# Patient Record
Sex: Male | Born: 1955 | Race: White | Hispanic: No | Marital: Married | Smoking: Former smoker
Health system: Southern US, Community
[De-identification: ages and names within clinical notes are randomized; demographics above are authoritative.]

## PROBLEM LIST (undated history)

## (undated) DIAGNOSIS — K429 Umbilical hernia without obstruction or gangrene: Secondary | ICD-10-CM

## (undated) DIAGNOSIS — G4733 Obstructive sleep apnea (adult) (pediatric): Secondary | ICD-10-CM

## (undated) DIAGNOSIS — E785 Hyperlipidemia, unspecified: Secondary | ICD-10-CM

## (undated) DIAGNOSIS — G4730 Sleep apnea, unspecified: Secondary | ICD-10-CM

## (undated) DIAGNOSIS — M1990 Unspecified osteoarthritis, unspecified site: Secondary | ICD-10-CM

## (undated) DIAGNOSIS — F17200 Nicotine dependence, unspecified, uncomplicated: Secondary | ICD-10-CM

## (undated) DIAGNOSIS — J45909 Unspecified asthma, uncomplicated: Secondary | ICD-10-CM

## (undated) DIAGNOSIS — I5189 Other ill-defined heart diseases: Secondary | ICD-10-CM

## (undated) DIAGNOSIS — I10 Essential (primary) hypertension: Secondary | ICD-10-CM

## (undated) DIAGNOSIS — K219 Gastro-esophageal reflux disease without esophagitis: Secondary | ICD-10-CM

## (undated) DIAGNOSIS — J449 Chronic obstructive pulmonary disease, unspecified: Secondary | ICD-10-CM

## (undated) DIAGNOSIS — Z9989 Dependence on other enabling machines and devices: Secondary | ICD-10-CM

## (undated) DIAGNOSIS — E669 Obesity, unspecified: Secondary | ICD-10-CM

## (undated) DIAGNOSIS — I2510 Atherosclerotic heart disease of native coronary artery without angina pectoris: Secondary | ICD-10-CM

## (undated) HISTORY — DX: Other ill-defined heart diseases: I51.89

## (undated) HISTORY — DX: Gastro-esophageal reflux disease without esophagitis: K21.9

## (undated) HISTORY — DX: Dependence on other enabling machines and devices: Z99.89

## (undated) HISTORY — DX: Obstructive sleep apnea (adult) (pediatric): G47.33

## (undated) HISTORY — DX: Unspecified asthma, uncomplicated: J45.909

## (undated) HISTORY — DX: Sleep apnea, unspecified: G47.30

## (undated) HISTORY — DX: Nicotine dependence, unspecified, uncomplicated: F17.200

## (undated) HISTORY — DX: Essential (primary) hypertension: I10

## (undated) HISTORY — DX: Obesity, unspecified: E66.9

## (undated) HISTORY — DX: Hyperlipidemia, unspecified: E78.5

## (undated) HISTORY — PX: CARDIAC CATHETERIZATION: SHX172

## (undated) HISTORY — DX: Umbilical hernia without obstruction or gangrene: K42.9

## (undated) HISTORY — DX: Chronic obstructive pulmonary disease, unspecified: J44.9

## (undated) HISTORY — DX: Atherosclerotic heart disease of native coronary artery without angina pectoris: I25.10

## (undated) HISTORY — DX: Unspecified osteoarthritis, unspecified site: M19.90

---
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
## 2002-05-24 ENCOUNTER — Ambulatory Visit (HOSPITAL_COMMUNITY): Admission: RE | Admit: 2002-05-24 | Discharge: 2002-05-24 | Payer: Self-pay | Admitting: Specialist

## 2002-05-24 ENCOUNTER — Encounter: Payer: Self-pay | Admitting: Specialist

## 2005-04-17 HISTORY — PX: COLONOSCOPY: SHX174

## 2005-05-31 ENCOUNTER — Ambulatory Visit: Payer: Self-pay | Admitting: Gastroenterology

## 2005-07-28 ENCOUNTER — Ambulatory Visit: Payer: Self-pay | Admitting: Gastroenterology

## 2005-07-28 ENCOUNTER — Encounter: Payer: Self-pay | Admitting: Gastroenterology

## 2005-10-08 ENCOUNTER — Emergency Department (HOSPITAL_COMMUNITY): Admission: EM | Admit: 2005-10-08 | Discharge: 2005-10-08 | Payer: Self-pay | Admitting: Emergency Medicine

## 2010-09-14 ENCOUNTER — Ambulatory Visit (HOSPITAL_BASED_OUTPATIENT_CLINIC_OR_DEPARTMENT_OTHER): Payer: 59 | Attending: Family Medicine

## 2010-09-14 DIAGNOSIS — I491 Atrial premature depolarization: Secondary | ICD-10-CM | POA: Insufficient documentation

## 2010-09-14 DIAGNOSIS — G4733 Obstructive sleep apnea (adult) (pediatric): Secondary | ICD-10-CM | POA: Insufficient documentation

## 2010-09-17 DIAGNOSIS — G4733 Obstructive sleep apnea (adult) (pediatric): Secondary | ICD-10-CM

## 2010-09-17 DIAGNOSIS — R0609 Other forms of dyspnea: Secondary | ICD-10-CM

## 2010-09-17 DIAGNOSIS — R0989 Other specified symptoms and signs involving the circulatory and respiratory systems: Secondary | ICD-10-CM

## 2010-09-17 NOTE — Procedures (Signed)
NAME:  James, Branch NO.:  [id number]  MEDICAL RECORD NO.:  [id number]          PATIENT TYPE:  OUT  LOCATION:  SLEEP CENTER                 FACILITY:  [facility]  PHYSICIAN:  Clinton D. Maple Hudson, MD, FCCP, FACPDATE OF BIRTH:  October 03[birth year] 1953  DATE OF STUDY:  09/14/2010                           NOCTURNAL POLYSOMNOGRAM  REFERRING PHYSICIAN:  WARREN TOM [name]  INDICATION FOR STUDY:  Hypersomnia with sleep apnea.  EPWORTH SLEEPINESS SCORE:  8/24, BMI 42.7.  Weight 306 pounds, height 71 inches.  Neck 19 inches.  MEDICATIONS:  Home medications are charted and reviewed.  SLEEP ARCHITECTURE:  Split-study protocol.  During the diagnostic phase, total sleep time 137.5 minutes with sleep efficiency 83.1%.  Stage I was 5.1%, stage II 71.3%, stage III absent, REM 23.6% of total sleep time. Sleep latency 16 minutes, REM latency 108.5 minutes, awake after sleep onset 10 minutes, arousal index 58.  BEDTIME MEDICATION:  None.  RESPIRATORY DATA:  Split-study protocol.  Apnea-hypopnea index (AHI) 83.8 per hour.  A total of 192 events was scored including 137 obstructive apneas and 55 hypopneas.  The events were not positional. REM/AHI 101.5 per hour.  CPAP was then titrated to 18 CWP, AHI 6.7 per hour.  He wore a large [employer] FX full-face mask with heated humidifier and a C-Flex setting of 3.  OXYGEN DATA:  Moderately loud snoring before CPAP with oxygen desaturation to a nadir of 77% on room air.  With CPAP titration, snoring was prevented and mean oxygen saturation held 91.3% on room air.  CARDIAC DATA:  Sinus rhythm with occasional PAC.  MOVEMENT-PARASOMNIA:  No significant movement disturbance.  Bathroom x2.  IMPRESSIONS-RECOMMENDATIONS: 1. Severe obstructive sleep apnea/hypopnea syndrome, AHI 83.8 per hour     with non positional events, moderately loud snoring and oxygen     desaturation to a nadir of 77% on room air. 2. Successful CPAP titration to 18 CWP,  AHI 6.7 per hour.  He wore a     large [employer] FX full-face mask with heated     humidifier and a C-Flex setting of 3.     Clinton D. Maple Hudson, MD, [facility], FACP Diplomate, Biomedical engineer of Sleep Medicine Electronically Signed    CDY/MEDQ  D:  09/17/2010 10:33:01  T:  09/17/2010 11:23:00  Job:  [id number]

## 2012-10-18 ENCOUNTER — Other Ambulatory Visit: Payer: Self-pay | Admitting: Family Medicine

## 2012-10-28 ENCOUNTER — Encounter: Payer: Self-pay | Admitting: Family Medicine

## 2012-10-28 ENCOUNTER — Ambulatory Visit (INDEPENDENT_AMBULATORY_CARE_PROVIDER_SITE_OTHER): Payer: Managed Care, Other (non HMO) | Admitting: Family Medicine

## 2012-10-28 VITALS — BP 106/60 | HR 78 | Temp 98.4°F | Resp 20 | Ht 70.5 in | Wt 315.0 lb

## 2012-10-28 DIAGNOSIS — E669 Obesity, unspecified: Secondary | ICD-10-CM | POA: Insufficient documentation

## 2012-10-28 DIAGNOSIS — G4733 Obstructive sleep apnea (adult) (pediatric): Secondary | ICD-10-CM | POA: Insufficient documentation

## 2012-10-28 DIAGNOSIS — I10 Essential (primary) hypertension: Secondary | ICD-10-CM

## 2012-10-28 DIAGNOSIS — F17200 Nicotine dependence, unspecified, uncomplicated: Secondary | ICD-10-CM | POA: Insufficient documentation

## 2012-10-28 DIAGNOSIS — I5189 Other ill-defined heart diseases: Secondary | ICD-10-CM | POA: Insufficient documentation

## 2012-10-28 DIAGNOSIS — Z125 Encounter for screening for malignant neoplasm of prostate: Secondary | ICD-10-CM

## 2012-10-28 NOTE — Progress Notes (Signed)
  Subjective:    Patient ID: James Branch, male    DOB: 04/15/1956, 57 y.o.   MRN: [id number]  HPI Patient is here today for followup of his hypertension.  He is currently taking hydrochlorothiazide 25 mg by mouth daily and lisinopril 20 mg by mouth daily. He denies chest pain, shortness of breath, or dyspnea on exertion. He is however smoking half a pack a day. He also has morbid obesity which is causing some mild shortness of breath..  he is seeing cardiology and had a negative stress test in 2010.  He also has severe obstructive sleep apnea. However he states he is compliant with CPAP therapy. Past Medical History  Diagnosis Date  . Hypertension   . Obesity   . Smoker   . OSA on CPAP     ahi-84, 18 CWP  . Diastolic dysfunction    Current Outpatient Prescriptions on File Prior to Visit  Medication Sig Dispense Refill  . hydrochlorothiazide (HYDRODIURIL) 25 MG tablet TAKE 1 TABLET BY MOUTH EVERY DAY  90 tablet  3  . lisinopril (PRINIVIL,ZESTRIL) 20 MG tablet TAKE 1 TABLET BY MOUTH EVERY DAY  90 tablet  3  . NEXIUM 40 MG capsule TAKE ONE CAPSULE BY MOUTH EVERY DAY  90 capsule  3   No current facility-administered medications on file prior to visit.   Allergies  Allergen Reactions  . Penicillins Swelling      Review of Systems  All other systems reviewed and are negative.       Objective:   Physical Exam  Vitals reviewed. Constitutional: He appears well-developed and well-nourished.  Neck: No JVD present. No thyromegaly present.  Cardiovascular: Normal rate, regular rhythm, normal heart sounds and intact distal pulses.   No murmur heard. Pulmonary/Chest: Effort normal and breath sounds normal. No respiratory distress. He has no wheezes. He has no rales.  Abdominal: Soft. Bowel sounds are normal. He exhibits no distension. There is no tenderness. There is no rebound and no guarding.  Musculoskeletal: He exhibits edema.  Lymphadenopathy:    He has no cervical adenopathy.    patient has +1 bipedal        Assessment & Plan:  1. HTN (hypertension) Blood pressure is currently well controlled. I will have the patient come back fasting to check a fasting lipid panel and CMP. I also advised significant diet, exercise, and weight loss. I recommended smoking cessation. - COMPLETE METABOLIC PANEL WITH GFR; Future - CBC with Differential; Future - Lipid panel; Future  2. Prostate cancer screening Also check a PSA to screen for prostate cancer. Colonoscopy is up-to-date - PSA; Future

## 2012-11-04 ENCOUNTER — Other Ambulatory Visit: Payer: Managed Care, Other (non HMO)

## 2012-11-04 DIAGNOSIS — I10 Essential (primary) hypertension: Secondary | ICD-10-CM

## 2012-11-04 DIAGNOSIS — Z125 Encounter for screening for malignant neoplasm of prostate: Secondary | ICD-10-CM

## 2012-11-05 LAB — CBC WITH DIFFERENTIAL/PLATELET
Basophils Relative: 1 % (ref 0–1)
Hemoglobin: 15.6 g/dL (ref 13.0–17.0)
MCHC: 34.3 g/dL (ref 30.0–36.0)
Monocytes Relative: 8 % (ref 3–12)
Neutro Abs: 3.8 10*3/uL (ref 1.7–7.7)
Neutrophils Relative %: 55 % (ref 43–77)
RBC: 5 MIL/uL (ref 4.22–5.81)

## 2012-11-05 LAB — LIPID PANEL
Cholesterol: 178 mg/dL (ref 0–200)
Triglycerides: 98 mg/dL (ref <150)
VLDL: 20 mg/dL (ref 0–40)

## 2012-11-05 LAB — COMPLETE METABOLIC PANEL WITH GFR
CO2: 24 mEq/L (ref 19–32)
Creat: 0.78 mg/dL (ref 0.50–1.35)
GFR, Est African American: >89 mL/min
GFR, Est Non African American: >89 mL/min
Glucose, Bld: 93 mg/dL (ref 70–99)
Total Bilirubin: 0.5 mg/dL (ref 0.3–1.2)
Total Protein: 6.6 g/dL (ref 6.0–8.3)

## 2013-06-17 ENCOUNTER — Ambulatory Visit: Payer: Managed Care, Other (non HMO) | Admitting: Family Medicine

## 2013-06-17 ENCOUNTER — Emergency Department (HOSPITAL_COMMUNITY)
Admission: EM | Admit: 2013-06-17 | Discharge: 2013-06-17 | Disposition: A | Discharge status: Home / Self Care | Payer: Managed Care, Other (non HMO) | Source: Home / Self Care | Attending: Emergency Medicine | Admitting: Emergency Medicine

## 2013-06-17 ENCOUNTER — Encounter (HOSPITAL_COMMUNITY): Payer: Self-pay | Admitting: Emergency Medicine

## 2013-06-17 DIAGNOSIS — J45909 Unspecified asthma, uncomplicated: Secondary | ICD-10-CM

## 2013-06-17 MED ORDER — METHYLPREDNISOLONE ACETATE 80 MG/ML IJ SUSP
INTRAMUSCULAR | Status: AC
  Filled 2013-06-17: qty 1

## 2013-06-17 MED ORDER — IPRATROPIUM-ALBUTEROL 0.5-2.5 (3) MG/3ML IN SOLN
3.0000 mL | RESPIRATORY_TRACT | Status: DC
  Administered 2013-06-17: 3 mL via RESPIRATORY_TRACT

## 2013-06-17 MED ORDER — AZITHROMYCIN 250 MG PO TABS: ORAL_TABLET | ORAL | Status: DC

## 2013-06-17 MED ORDER — HYDROCOD POLST-CHLORPHEN POLST 10-8 MG/5ML PO LQCR: 5.0000 mL | Freq: Two times a day (BID) | ORAL | Status: DC | PRN

## 2013-06-17 MED ORDER — METHYLPREDNISOLONE ACETATE 80 MG/ML IJ SUSP
80.0000 mg | Freq: Once | INTRAMUSCULAR | Status: AC
  Administered 2013-06-17: 80 mg via INTRAMUSCULAR

## 2013-06-17 MED ORDER — IPRATROPIUM-ALBUTEROL 0.5-2.5 (3) MG/3ML IN SOLN
RESPIRATORY_TRACT | Status: AC
  Filled 2013-06-17: qty 3

## 2013-06-17 MED ORDER — PREDNISONE 20 MG PO TABS: ORAL_TABLET | ORAL | Status: DC

## 2013-06-17 MED ORDER — HYDROCODONE-ACETAMINOPHEN 5-325 MG PO TABS
ORAL_TABLET | ORAL | Status: AC
  Filled 2013-06-17: qty 2

## 2013-06-17 MED ORDER — ALBUTEROL SULFATE HFA 108 (90 BASE) MCG/ACT IN AERS: 2.0000 | INHALATION_SPRAY | Freq: Four times a day (QID) | RESPIRATORY_TRACT | Status: DC

## 2013-06-17 NOTE — Discharge Instructions (Signed)

## 2013-06-17 NOTE — ED Notes (Signed)
C/o  Sinus pressure and pain.  Post nasal drip.  Chest congestion.  Cough.  Fever for on day.  On set Wednesday.  Denies n/v/d.  No relief with otc meds.

## 2013-06-17 NOTE — ED Provider Notes (Signed)
Chief Complaint   Chief Complaint  Patient presents with  . URI    History of Present Illness   James Branch is a 58 year old smoker who presents today with a one-week history of nasal congestion with clear drainage, headache, sinus pressure, cough productive of clear mucus, wheezing, subjective fever, and chills. He denies any sore throat or GI symptoms. No specific sick exposures.  Review of Systems   Other than as noted above, the patient denies any of the following symptoms: Systemic:  No fevers, chills, sweats, or myalgias. Eye:  No redness or discharge. ENT:  No ear pain, headache, nasal congestion, drainage, sinus pressure, or sore throat. Neck:  No neck pain, stiffness, or swollen glands. Lungs:  No cough, sputum production, hemoptysis, wheezing, chest tightness, shortness of breath or chest pain. GI:  No abdominal pain, nausea, vomiting or diarrhea.  [place]   Past medical history, family history, social history, meds, and allergies were reviewed. He is allergic to penicillin. He takes Nexium, lisinopril, and hydrochlorothiazide. He has high blood pressure and GERD.  Physical exam   Vital signs:  BP 121/70  Pulse 66  Temp(Src) 97.9 F (36.6 C) (Oral)  Resp 22  SpO2 95% General:  Alert and oriented.  In no distress.  Skin warm and dry. Eye:  No conjunctival injection or drainage. Lids were normal. ENT:  TMs and canals were normal, without erythema or inflammation.  Nasal mucosa was clear and uncongested, without drainage.  Mucous membranes were moist.  Pharynx was clear with no exudate or drainage.  There were no oral ulcerations or lesions. Neck:  Supple, no adenopathy, tenderness or mass. Lungs:  No respiratory distress.  He has bilateral inspiratory and expiratory wheezes, no rales or rhonchi, good air movement bilaterally.  Heart:  Regular rhythm, without gallops, murmers or rubs. Skin:  Clear, warm, and dry, without rash or lesions.   Course in [facility] [place]   He was given a DuoNeb breathing treatment and Depo-Medrol 80 mg IM. Thereafter he felt better, auscultation of his lungs reveals clearing of his wheezes, but he still did have some inspiratory and expiratory wheezes with good air movement and no respiratory distress.  Assessment     The encounter diagnosis was Asthmatic bronchitis.  Plan    1.  Meds:  The following meds were prescribed:   Discharge Medication List as of 06/17/2013 12:10 PM    START taking these medications   Details  albuterol (PROVENTIL HFA;VENTOLIN HFA) 108 (90 BASE) MCG/ACT inhaler Inhale 2 puffs into the lungs 4 (four) times daily., Starting 06/17/2013, Until Discontinued, Normal    azithromycin (ZITHROMAX Z-PAK) 250 MG tablet Take as directed., Normal    chlorpheniramine-HYDROcodone (TUSSIONEX) 10-8 MG/5ML LQCR Take 5 mLs by mouth every 12 (twelve) hours as needed for cough., Starting 06/17/2013, Until Discontinued, Normal    predniSONE (DELTASONE) 20 MG tablet 3 daily for 4 days, 2 daily for 4 days, 1 daily for 4 days., Normal        2.  Patient Education/Counseling:  The patient was given appropriate handouts, self care instructions, and instructed in symptomatic relief.  Instructed to get extra fluids, rest, and use a cool mist vaporizer.    3.  Follow up:  The patient was told to follow up here if no better in 3 to 4 days, or sooner if becoming worse in any way, and given some red flag symptoms such as increasing fever, difficulty breathing, chest pain, or persistent vomiting which would  prompt immediate return.  Follow up here as needed.      Harden Mo, MD 06/17/13 [phone]

## 2013-10-10 ENCOUNTER — Other Ambulatory Visit: Payer: Self-pay | Admitting: Family Medicine

## 2013-10-10 ENCOUNTER — Encounter: Payer: Self-pay | Admitting: Family Medicine

## 2013-10-11 ENCOUNTER — Other Ambulatory Visit: Payer: Self-pay | Admitting: Family Medicine

## 2013-10-13 NOTE — Telephone Encounter (Signed)
Medication filled x1 with no refills.   Requires office visit before any further refills can be given.   Letter sent.  

## 2013-12-23 ENCOUNTER — Encounter: Payer: Self-pay | Admitting: Family Medicine

## 2013-12-23 ENCOUNTER — Ambulatory Visit (INDEPENDENT_AMBULATORY_CARE_PROVIDER_SITE_OTHER): Payer: Managed Care, Other (non HMO) | Admitting: Family Medicine

## 2013-12-23 VITALS — BP 116/64 | HR 74 | Temp 98.0°F | Resp 18 | Ht 71.0 in | Wt 317.0 lb

## 2013-12-23 DIAGNOSIS — J441 Chronic obstructive pulmonary disease with (acute) exacerbation: Secondary | ICD-10-CM

## 2013-12-23 DIAGNOSIS — J45901 Unspecified asthma with (acute) exacerbation: Secondary | ICD-10-CM

## 2013-12-23 DIAGNOSIS — J4541 Moderate persistent asthma with (acute) exacerbation: Secondary | ICD-10-CM

## 2013-12-23 MED ORDER — PREDNISONE 20 MG PO TABS: ORAL_TABLET | ORAL | Status: DC

## 2013-12-23 MED ORDER — ALBUTEROL SULFATE HFA 108 (90 BASE) MCG/ACT IN AERS: 2.0000 | INHALATION_SPRAY | Freq: Four times a day (QID) | RESPIRATORY_TRACT | Status: DC

## 2013-12-23 NOTE — Progress Notes (Signed)
Subjective:    Patient ID: James Branch, male    DOB: [birth year], 58 y.o.   MRN: [id number]  HPI Patient is a morbidly obese smoker who has COPD characterized by occasional episodes of bronchitis and chronic dyspnea on exertion. Recently his wife developed an upper respiratory infection and sinusitis. Over the last 3 days the patient has developed rhinorrhea. However he developed shortness of breath, wheezing, and a nonproductive cough. He denies any fevers. On examination his breath sounds are diminished bilaterally. He has expiratory wheezes bilaterally and rhonchorous breath sounds. Past Medical History  Diagnosis Date  . Hypertension   . Obesity   . Smoker   . OSA on CPAP     ahi-84, 18 CWP  . Diastolic dysfunction   . COPD (chronic obstructive pulmonary disease)    Current Outpatient Prescriptions on File Prior to Visit  Medication Sig Dispense Refill  . hydrochlorothiazide (HYDRODIURIL) 25 MG tablet TAKE 1 TABLET BY MOUTH EVERY DAY  90 tablet  0  . lisinopril (PRINIVIL,ZESTRIL) 20 MG tablet TAKE 1 TABLET BY MOUTH EVERY DAY  90 tablet  0  . NEXIUM 40 MG capsule TAKE 1 CAPSULE EVERY DAY  90 capsule  0  . predniSONE (DELTASONE) 20 MG tablet 3 daily for 4 days, 2 daily for 4 days, 1 daily for 4 days.  24 tablet  0   No current facility-administered medications on file prior to visit.   Allergies  Allergen Reactions  . Penicillins Swelling   History   Social History  . Marital Status: Single    Spouse Name: N/A    Number of Children: N/A  . Years of Education: N/A   Occupational History  . Not on file.   Social History Main Topics  . Smoking status: Current Every Day Smoker    Types: Cigarettes  . Smokeless tobacco: Not on file  . Alcohol Use: No  . Drug Use: No  . Sexual Activity: Yes    Birth Control/ Protection: Condom   Other Topics Concern  . Not on file   Social History Narrative  . No narrative on file      Review of Systems  All other systems  reviewed and are negative.      Objective:   Physical Exam  Vitals reviewed. Constitutional: He appears well-developed and well-nourished.  HENT:  Right Ear: External ear normal.  Left Ear: External ear normal.  Nose: Nose normal.  Mouth/Throat: Oropharynx is clear and moist. No oropharyngeal exudate.  Eyes: Conjunctivae are normal. No scleral icterus.  Neck: Neck supple. No JVD present.  Cardiovascular: Normal rate, regular rhythm and normal heart sounds.   No murmur heard. Pulmonary/Chest: Effort normal. No respiratory distress. He has wheezes. He has rales.  Lymphadenopathy:    He has no cervical adenopathy.          Assessment & Plan:  1.  COPD exacerbation- probably the patient has acute on chronic bronchitis/COPD exacerbation. I will treat the patient with prednisone taper pack and albuterol 2 puffs inhaled every 6 hours as needed for wheezing. If the cough becomes productive of brown purulent sputum or if he develops a fever, I would add azithromycin.  - predniSONE (DELTASONE) 20 MG tablet; 3 tabs poqday 1-2, 2 tabs poqday 3-4, 1 tab poqday 5-6  Dispense: 12 tablet; Refill: 0 - albuterol (PROVENTIL HFA;VENTOLIN HFA) 108 (90 BASE) MCG/ACT inhaler; Inhale 2 puffs into the lungs 4 (four) times daily.  Dispense: 1 Inhaler; Refill: 0

## 2013-12-29 ENCOUNTER — Telehealth: Payer: Self-pay | Admitting: *Deleted

## 2013-12-29 MED ORDER — AZITHROMYCIN 250 MG PO TABS: ORAL_TABLET | ORAL | Status: DC

## 2013-12-29 MED ORDER — PREDNISONE 20 MG PO TABS: ORAL_TABLET | ORAL | Status: DC

## 2013-12-29 NOTE — Telephone Encounter (Signed)
Received call from patient daughter.   Reports that patient has completed Prednisone, but is not improving.   States that he continues to have SOB and wheezing, and chest congestion with non-productive cough.   Reports that patient is using inhaler as ordered. Requested MD to advise.

## 2013-12-29 NOTE — Telephone Encounter (Signed)
Add z pack and extend prednisone with additional taper pack.

## 2013-12-29 NOTE — Telephone Encounter (Signed)
Med sent to pharm and pt's wife aware 

## 2014-01-09 ENCOUNTER — Other Ambulatory Visit: Payer: Self-pay | Admitting: Family Medicine

## 2014-01-09 NOTE — Telephone Encounter (Signed)
Medication refilled per protocol. 

## 2014-02-28 ENCOUNTER — Encounter (HOSPITAL_COMMUNITY): Payer: Self-pay | Admitting: *Deleted

## 2014-02-28 ENCOUNTER — Emergency Department (HOSPITAL_COMMUNITY)
Admission: EM | Admit: 2014-02-28 | Discharge: 2014-02-28 | Disposition: A | Discharge status: Home / Self Care | Payer: Managed Care, Other (non HMO) | Source: Home / Self Care | Attending: Family Medicine | Admitting: Family Medicine

## 2014-02-28 DIAGNOSIS — Z9989 Dependence on other enabling machines and devices: Secondary | ICD-10-CM

## 2014-02-28 DIAGNOSIS — J4541 Moderate persistent asthma with (acute) exacerbation: Secondary | ICD-10-CM

## 2014-02-28 DIAGNOSIS — G4733 Obstructive sleep apnea (adult) (pediatric): Secondary | ICD-10-CM

## 2014-02-28 DIAGNOSIS — J441 Chronic obstructive pulmonary disease with (acute) exacerbation: Secondary | ICD-10-CM

## 2014-02-28 DIAGNOSIS — J069 Acute upper respiratory infection, unspecified: Secondary | ICD-10-CM

## 2014-02-28 MED ORDER — IPRATROPIUM-ALBUTEROL 0.5-2.5 (3) MG/3ML IN SOLN
3.0000 mL | Freq: Once | RESPIRATORY_TRACT | Status: AC
  Administered 2014-02-28: 3 mL via RESPIRATORY_TRACT

## 2014-02-28 MED ORDER — METHYLPREDNISOLONE SODIUM SUCC 125 MG IJ SOLR
INTRAMUSCULAR | Status: AC
  Filled 2014-02-28: qty 2

## 2014-02-28 MED ORDER — PREDNISONE 20 MG PO TABS: ORAL_TABLET | ORAL | Status: DC

## 2014-02-28 MED ORDER — DOXYCYCLINE HYCLATE 100 MG PO TABS: 100.0000 mg | ORAL_TABLET | Freq: Two times a day (BID) | ORAL | Status: DC

## 2014-02-28 MED ORDER — IPRATROPIUM-ALBUTEROL 0.5-2.5 (3) MG/3ML IN SOLN
RESPIRATORY_TRACT | Status: AC
Start: 2014-02-28 — End: 2014-02-28
  Filled 2014-02-28: qty 3

## 2014-02-28 MED ORDER — METHYLPREDNISOLONE SODIUM SUCC 125 MG IJ SOLR
125.0000 mg | Freq: Once | INTRAMUSCULAR | Status: AC
  Administered 2014-02-28: 125 mg via INTRAMUSCULAR

## 2014-02-28 MED ORDER — IPRATROPIUM BROMIDE 0.06 % NA SOLN: 2.0000 | Freq: Four times a day (QID) | NASAL | Status: DC

## 2014-02-28 NOTE — Discharge Instructions (Signed)
You are doing well overall.  You are suffering from a COPD exacerbation The steroids adn breathing treatment you received should help significantly Pelase start your albuterol every 4 hours for the next 24 hours Please take the doxy Please take the prednisone every day with breakfast Please also consider using the nasal atrovent to help with the congestion nad discharge Continue the nasal saline and consider using mucinex

## 2014-02-28 NOTE — ED Provider Notes (Signed)
CSN: [id number]     Arrival date & time 02/28/14  0901 History   First MD Initiated Contact with Patient 02/28/14 0915     Chief Complaint  Patient presents with  . URI   (Consider location/radiation/quality/duration/timing/severity/associated sxs/prior Treatment) HPI  URI: ongoing for 2 weeks. Started as runny nose an dcough. Now w/ worsening nasal discharge, and chest congestion. No fevers. Getting worse. No home O2 other than at night. Deneis CP, SOB, syncope. Tried nasal saline and albuterol w/ benefit.    Past Medical History  Diagnosis Date  . Hypertension   . Obesity   . Smoker   . OSA on CPAP     ahi-84, 18 CWP  . Diastolic dysfunction   . COPD (chronic obstructive pulmonary disease)    Past Surgical History  Procedure Laterality Date  . Tonsillectomy  1967   Family History  Problem Relation Age of Onset  . Hypertension Mother   . Hypertension Father    History  Substance Use Topics  . Smoking status: Current Every Day Smoker    Types: Cigarettes  . Smokeless tobacco: Not on file  . Alcohol Use: No    Review of Systems Per HPI with all other pertinent systems negative.   Allergies  Penicillins  Home Medications   Prior to Admission medications   Medication Sig Start Date End Date Taking? Authorizing Provider  albuterol (PROVENTIL HFA;VENTOLIN HFA) 108 (90 BASE) MCG/ACT inhaler Inhale 2 puffs into the lungs 4 (four) times daily. 12/23/13  Yes [name], MD  esomeprazole (NEXIUM) 40 MG capsule TAKE 1 CAPSULE EVERY DAY 01/09/14  Yes [name], MD  hydrochlorothiazide (HYDRODIURIL) 25 MG tablet TAKE 1 TABLET BY MOUTH EVERY DAY 01/09/14  Yes [name], MD  lisinopril (PRINIVIL,ZESTRIL) 20 MG tablet TAKE 1 TABLET BY MOUTH EVERY DAY 01/09/14  Yes [name], MD  azithromycin (ZITHROMAX Z-PAK) 250 MG tablet 2 tab po x 1 day and 1 tab po day 2-5 12/29/13   [name], MD  predniSONE (DELTASONE) 20 MG tablet 3 tabs poqday 1-2, 2 tabs  poqday 3-4, 1 tab poqday 5-6 12/23/13   [name], MD  predniSONE (DELTASONE) 20 MG tablet 3 daily for 4 days, 2 daily for 4 days, 1 daily for 4 days. 12/29/13   [name], MD   BP 137/61 mmHg  Pulse 94  Temp(Src) 99.1 F (37.3 C) (Oral)  Resp 18  SpO2 97% Physical Exam  Constitutional: He is oriented to person, place, and time. He appears well-developed and well-nourished. No distress.  HENT:  Head: Normocephalic and atraumatic.  Nasal speech. Frontal adn maxilary sinuses nonttp  Eyes: EOM are normal. Pupils are equal, round, and reactive to light.  Neck: Normal range of motion.  Cardiovascular: Normal rate, normal heart sounds and intact distal pulses.   No murmur heard. Pulmonary/Chest:  Diffuse wheezing throughout, no crackles or consolidation. Good air movement adn nml effort.   Abdominal: Soft.  Musculoskeletal: Normal range of motion. He exhibits no tenderness.  Neurological: He is alert and oriented to person, place, and time. Coordination normal.  Skin: Skin is warm. He is not diaphoretic.  Psychiatric: He has a normal mood and affect. His behavior is normal. Judgment normal.    ED Course  Procedures (including critical care time) Labs Review Labs Reviewed - No data to display  Imaging Review No results found.   MDM   1. COPD exacerbation   2. URI (upper respiratory infection)  3. OSA on CPAP    No new O2 requirement  Solumedrol 125mg  IM in office [place]  Much improved  Start Q4 albuterol Prednisone 60mg  x 5 days Doxy Continue nasal saline and start intranasal atrovent Mucinex  Linna Darner, MD Family Medicine 02/28/2014, 9:39 AM      Waldemar Dickens, MD 02/28/14 [phone]

## 2014-02-28 NOTE — ED Notes (Signed)
Started as a head cold x 1-2 weeks and now it is getting into his chest. Prod. cough in AM of white sputum.  No chills or fever.  Having trouble sleeping. Cough wakes up coughing.  Sinuses clog up more at night.

## 2014-03-30 ENCOUNTER — Other Ambulatory Visit: Payer: Self-pay | Admitting: Family Medicine

## 2014-04-06 ENCOUNTER — Telehealth: Payer: Self-pay | Admitting: Family Medicine

## 2014-04-06 DIAGNOSIS — J4541 Moderate persistent asthma with (acute) exacerbation: Secondary | ICD-10-CM

## 2014-04-06 MED ORDER — ALBUTEROL SULFATE HFA 108 (90 BASE) MCG/ACT IN AERS: 2.0000 | INHALATION_SPRAY | Freq: Four times a day (QID) | RESPIRATORY_TRACT | Status: DC

## 2014-04-06 NOTE — Telephone Encounter (Signed)
[phone] [facility] [place] Pt would like to have pro air inhaler called in

## 2014-04-06 NOTE — Telephone Encounter (Signed)
Med sent to pharm 

## 2014-04-26 ENCOUNTER — Emergency Department (HOSPITAL_COMMUNITY)
Admission: EM | Admit: 2014-04-26 | Discharge: 2014-04-26 | Disposition: A | Discharge status: Home / Self Care | Payer: Managed Care, Other (non HMO) | Source: Home / Self Care | Attending: Emergency Medicine | Admitting: Emergency Medicine

## 2014-04-26 ENCOUNTER — Encounter (HOSPITAL_COMMUNITY): Payer: Self-pay | Admitting: Emergency Medicine

## 2014-04-26 DIAGNOSIS — J441 Chronic obstructive pulmonary disease with (acute) exacerbation: Secondary | ICD-10-CM

## 2014-04-26 DIAGNOSIS — J4541 Moderate persistent asthma with (acute) exacerbation: Secondary | ICD-10-CM

## 2014-04-26 MED ORDER — AZITHROMYCIN 250 MG PO TABS: ORAL_TABLET | ORAL | Status: DC

## 2014-04-26 MED ORDER — PREDNISONE 20 MG PO TABS: ORAL_TABLET | ORAL | Status: DC

## 2014-04-26 NOTE — Discharge Instructions (Signed)
Take the z-pac and prednisone as prescribed. Use your albuterol as needed. If you continue to have frequent bronchitis episodes, follow up with your regular doctor to discuss a controller inhaler.

## 2014-04-26 NOTE — ED Provider Notes (Signed)
CSN: [id number]     Arrival date & time 04/26/14  0901 History   First MD Initiated Contact with Patient 04/26/14 0912     Chief Complaint  Patient presents with  . URI   (Consider location/radiation/quality/duration/timing/severity/associated sxs/prior Treatment) HPI He is a 59 year old man here for evaluation of URI symptoms. He was seen for the same symptoms in November. Completed the doxycycline and prednisone, and states he was about 60% better. However, he never improved beyond that. Then, 2 weeks ago, he thinks his wife has to virus back to him. He reports worsening head and chest congestion as well as cough. He denies any fevers or shortness of breath. No nausea or vomiting. He has been using his albuterol twice a day with some improvement.   Past Medical History  Diagnosis Date  . Hypertension   . Obesity   . Smoker   . OSA on CPAP     ahi-84, 18 CWP  . Diastolic dysfunction   . COPD (chronic obstructive pulmonary disease)    Past Surgical History  Procedure Laterality Date  . Tonsillectomy  1967   Family History  Problem Relation Age of Onset  . Hypertension Mother   . Hypertension Father    History  Substance Use Topics  . Smoking status: Current Every Day Smoker    Types: Cigarettes  . Smokeless tobacco: Not on file  . Alcohol Use: No    Review of Systems  Constitutional: Negative for fever and chills.  HENT: Positive for congestion. Negative for rhinorrhea and sore throat.   Respiratory: Positive for cough. Negative for shortness of breath.   Cardiovascular: Negative for chest pain.  Gastrointestinal: Negative for nausea and vomiting.  Musculoskeletal: Negative for myalgias.    Allergies  Penicillins  Home Medications   Prior to Admission medications   Medication Sig Start Date End Date Taking? Authorizing Provider  hydrochlorothiazide (HYDRODIURIL) 25 MG tablet TAKE 1 TABLET BY MOUTH EVERY DAY 03/30/14  Yes [name], MD  lisinopril  (PRINIVIL,ZESTRIL) 20 MG tablet TAKE 1 TABLET BY MOUTH EVERY DAY 03/30/14  Yes [name], MD  NEXIUM 40 MG capsule TAKE ONE CAPSULE BY MOUTH EVERY DAY 03/30/14  Yes [name], MD  albuterol (PROVENTIL HFA;VENTOLIN HFA) 108 (90 BASE) MCG/ACT inhaler Inhale 2 puffs into the lungs 4 (four) times daily. 04/06/14   [name], MD  azithromycin (ZITHROMAX Z-PAK) 250 MG tablet 2 tab po x 1 day and 1 tab po day 2-5 04/26/14   Melony Overly, MD  ipratropium (ATROVENT) 0.06 % nasal spray Place 2 sprays into both nostrils 4 (four) times daily. 02/28/14   Waldemar Dickens, MD  predniSONE (DELTASONE) 20 MG tablet Take 3 tablets daily with breakfast for 4 days 04/26/14   Melony Overly, MD   BP 138/76 mmHg  Pulse 66  Temp(Src) 98.5 F (36.9 C)  Resp 20  SpO2 96% Physical Exam  Constitutional: He is oriented to person, place, and time. He appears well-developed and well-nourished. No distress.  HENT:  Head: Normocephalic and atraumatic.  Right Ear: External ear normal.  Left Ear: External ear normal.  Mouth/Throat: Oropharynx is clear and moist. No oropharyngeal exudate.  Eyes: Conjunctivae are normal.  Neck: Neck supple.  Cardiovascular: Normal rate, regular rhythm and normal heart sounds.   No murmur heard. Pulmonary/Chest: Effort normal. No respiratory distress. He has no wheezes. He has no rales.  Diffusely coarse breath sounds without wheezing or rhonchi  Lymphadenopathy:  He has no cervical adenopathy.  Neurological: He is alert and oriented to person, place, and time.    ED Course  Procedures (including critical care time) Labs Review Labs Reviewed - No data to display  Imaging Review No results found.   MDM   1. COPD exacerbation   2. Asthma with acute exacerbation, moderate persistent    Will repeat prednisone taper. We'll also give a Z-Pak. Use albuterol as needed. Recommended following up with PCP if he continues to have frequent exacerbations to discuss a  controller inhaler.    Melony Overly, MD 04/26/14 [phone]

## 2014-04-26 NOTE — ED Notes (Signed)
C/o  Pt was seen on 11/14.  For uri.  States finished antibiotics but got worse.  Still  Having head and chest congestion.  No relief with otc.

## 2014-06-06 ENCOUNTER — Encounter (HOSPITAL_COMMUNITY): Payer: Self-pay | Admitting: *Deleted

## 2014-06-06 ENCOUNTER — Emergency Department (HOSPITAL_COMMUNITY)
Admission: EM | Admit: 2014-06-06 | Discharge: 2014-06-06 | Disposition: A | Discharge status: Home / Self Care | Payer: Managed Care, Other (non HMO) | Source: Home / Self Care | Attending: Emergency Medicine | Admitting: Emergency Medicine

## 2014-06-06 DIAGNOSIS — J069 Acute upper respiratory infection, unspecified: Secondary | ICD-10-CM | POA: Diagnosis not present

## 2014-06-06 DIAGNOSIS — J441 Chronic obstructive pulmonary disease with (acute) exacerbation: Secondary | ICD-10-CM | POA: Diagnosis not present

## 2014-06-06 MED ORDER — ALBUTEROL SULFATE (5 MG/ML) 0.5% IN NEBU
5.0000 mg | INHALATION_SOLUTION | Freq: Once | RESPIRATORY_TRACT | Status: AC
  Administered 2014-06-06: 5 mg via RESPIRATORY_TRACT

## 2014-06-06 MED ORDER — METHYLPREDNISOLONE SODIUM SUCC 125 MG IJ SOLR
INTRAMUSCULAR | Status: AC
  Filled 2014-06-06: qty 2

## 2014-06-06 MED ORDER — PREDNISONE 20 MG PO TABS: ORAL_TABLET | ORAL | Status: DC

## 2014-06-06 MED ORDER — ALBUTEROL SULFATE (2.5 MG/3ML) 0.083% IN NEBU
INHALATION_SOLUTION | RESPIRATORY_TRACT | Status: AC
  Filled 2014-06-06: qty 6

## 2014-06-06 MED ORDER — IPRATROPIUM-ALBUTEROL 0.5-2.5 (3) MG/3ML IN SOLN
3.0000 mL | Freq: Once | RESPIRATORY_TRACT | Status: AC
  Administered 2014-06-06: 3 mL via RESPIRATORY_TRACT

## 2014-06-06 MED ORDER — IPRATROPIUM BROMIDE 0.02 % IN SOLN
RESPIRATORY_TRACT | Status: AC
  Filled 2014-06-06: qty 2.5

## 2014-06-06 MED ORDER — ALBUTEROL SULFATE (2.5 MG/3ML) 0.083% IN NEBU
2.5000 mg | INHALATION_SOLUTION | Freq: Once | RESPIRATORY_TRACT | Status: AC
  Administered 2014-06-06: 2.5 mg via RESPIRATORY_TRACT

## 2014-06-06 MED ORDER — METHYLPREDNISOLONE SODIUM SUCC 125 MG IJ SOLR
125.0000 mg | Freq: Once | INTRAMUSCULAR | Status: AC
  Administered 2014-06-06: 125 mg via INTRAMUSCULAR

## 2014-06-06 MED ORDER — IPRATROPIUM BROMIDE 0.06 % NA SOLN: 2.0000 | Freq: Four times a day (QID) | NASAL | Status: DC

## 2014-06-06 NOTE — Discharge Instructions (Signed)
Upper Respiratory Infection, Adult For persistent nasal drainage use the Atrovent nasal spray as prescribed. We also obtained an over-the-counter Allegra or Claritin or Zyrtec for nasal drainage. You sure albuterol HFA 2 puffs every 4 hours as needed for cough and wheeze Continue to use her saline nasal spray frequently For worsening, new symptoms problems such as fever, shortness of breath or increased cough seek medical attention promptly. Call your PCP Monday for follow-up appointment  An upper respiratory infection (URI) is also sometimes known as the common cold. The upper respiratory tract includes the nose, sinuses, throat, trachea, and bronchi. Bronchi are the airways leading to the lungs. Most people improve within 1 week, but symptoms can last up to 2 weeks. A residual cough may last even longer.  CAUSES Many different viruses can infect the tissues lining the upper respiratory tract. The tissues become irritated and inflamed and often become very moist. Mucus production is also common. A cold is contagious. You can easily spread the virus to others by oral contact. This includes kissing, sharing a glass, coughing, or sneezing. Touching your mouth or nose and then touching a surface, which is then touched by another person, can also spread the virus. SYMPTOMS  Symptoms typically develop 1 to 3 days after you come in contact with a cold virus. Symptoms vary from person to person. They may include:  Runny nose.  Sneezing.  Nasal congestion.  Sinus irritation.  Sore throat.  Loss of voice (laryngitis).  Cough.  Fatigue.  Muscle aches.  Loss of appetite.  Headache.  Low-grade fever. DIAGNOSIS  You might diagnose your own cold based on familiar symptoms, since most people get a cold 2 to 3 times a year. Your caregiver can confirm this based on your exam. Most importantly, your caregiver can check that your symptoms are not due to another disease such as strep throat,  sinusitis, pneumonia, asthma, or epiglottitis. Blood tests, throat tests, and X-rays are not necessary to diagnose a common cold, but they may sometimes be helpful in excluding other more serious diseases. Your caregiver will decide if any further tests are required. RISKS AND COMPLICATIONS  You may be at risk for a more severe case of the common cold if you smoke cigarettes, have chronic heart disease (such as heart failure) or lung disease (such as asthma), or if you have a weakened immune system. The very young and very old are also at risk for more serious infections. Bacterial sinusitis, middle ear infections, and bacterial pneumonia can complicate the common cold. The common cold can worsen asthma and chronic obstructive pulmonary disease (COPD). Sometimes, these complications can require emergency medical care and may be life-threatening. PREVENTION  The best way to protect against getting a cold is to practice good hygiene. Avoid oral or hand contact with people with cold symptoms. Wash your hands often if contact occurs. There is no clear evidence that vitamin C, vitamin E, echinacea, or exercise reduces the chance of developing a cold. However, it is always recommended to get plenty of rest and practice good nutrition. TREATMENT  Treatment is directed at relieving symptoms. There is no cure. Antibiotics are not effective, because the infection is caused by a virus, not by bacteria. Treatment may include: 1. Increased fluid intake. Sports drinks offer valuable electrolytes, sugars, and fluids. 2. Breathing heated mist or steam (vaporizer or shower). 3. Eating chicken soup or other clear broths, and maintaining good nutrition. 4. Getting plenty of rest. 5. Using gargles or lozenges for comfort.  6. Controlling fevers with ibuprofen or acetaminophen as directed by your caregiver. 7. Increasing usage of your inhaler if you have asthma. Zinc gel and zinc lozenges, taken in the first 24 hours of the  common cold, can shorten the duration and lessen the severity of symptoms. Pain medicines may help with fever, muscle aches, and throat pain. A variety of non-prescription medicines are available to treat congestion and runny nose. Your caregiver can make recommendations and may suggest nasal or lung inhalers for other symptoms.  HOME CARE INSTRUCTIONS   Only take over-the-counter or prescription medicines for pain, discomfort, or fever as directed by your caregiver.  Use a warm mist humidifier or inhale steam from a shower to increase air moisture. This may keep secretions moist and make it easier to breathe.  Drink enough water and fluids to keep your urine clear or pale yellow.  Rest as needed.  Return to work when your temperature has returned to normal or as your caregiver advises. You may need to stay home longer to avoid infecting others. You can also use a face mask and careful hand washing to prevent spread of the virus. SEEK MEDICAL CARE IF:   After the first few days, you feel you are getting worse rather than better.  You need your caregiver's advice about medicines to control symptoms.  You develop chills, worsening shortness of breath, or brown or red sputum. These may be signs of pneumonia.  You develop yellow or brown nasal discharge or pain in the face, especially when you bend forward. These may be signs of sinusitis.  You develop a fever, swollen neck glands, pain with swallowing, or white areas in the back of your throat. These may be signs of strep throat. SEEK IMMEDIATE MEDICAL CARE IF:   You have a fever.  You develop severe or persistent headache, ear pain, sinus pain, or chest pain.  You develop wheezing, a prolonged cough, cough up blood, or have a change in your usual mucus (if you have chronic lung disease).  You develop sore muscles or a stiff neck. Document Released: 09/27/2000 Document Revised: 06/26/2011 Document Reviewed: 07/09/2013 [facility] Patient  Information 2015 [place], [place]. This information is not intended to replace advice given to you by your health care provider. Make sure you discuss any questions you have with your health care provider.  How to Use an Inhaler Using your inhaler correctly is very important. Good technique will make sure that the medicine reaches your lungs.  HOW TO USE AN INHALER:  Take the cap off the inhaler.  If this is the first time using your inhaler, you need to prime it. Shake the inhaler for 5 seconds. Release four puffs into the air, away from your face. Ask your doctor for help if you have questions.  Shake the inhaler for 5 seconds.  Turn the inhaler so the bottle is above the mouthpiece.  Put your pointer finger on top of the bottle. Your thumb holds the bottom of the inhaler.  Open your mouth.  Either hold the inhaler away from your mouth (the width of 2 fingers) or place your lips tightly around the mouthpiece. Ask your doctor which way to use your inhaler.  Breathe out as much air as possible.  Breathe in and push down on the bottle 1 time to release the medicine. You will feel the medicine go in your mouth and throat.  Continue to take a deep breath in very slowly. Try to fill your lungs.  After you have breathed in completely, hold your breath for 10 seconds. This will help the medicine to settle in your lungs. If you cannot hold your breath for 10 seconds, hold it for as long as you can before you breathe out.  Breathe out slowly, through pursed lips. Whistling is an example of pursed lips.  If your doctor has told you to take more than 1 puff, wait at least 15-30 seconds between puffs. This will help you get the best results from your medicine. Do not use the inhaler more than your doctor tells you to.  Put the cap back on the inhaler.  Follow the directions from your doctor or from the inhaler package about cleaning the inhaler. If you use more than one inhaler, ask your doctor  which inhalers to use and what order to use them in. Ask your doctor to help you figure out when you will need to refill your inhaler.  If you use a steroid inhaler, always rinse your mouth with water after your last puff, gargle and spit out the water. Do not swallow the water. GET HELP IF: 8. The inhaler medicine only partially helps to stop wheezing or shortness of breath. 9. You are having trouble using your inhaler. 10. You have some increase in thick spit (phlegm). GET HELP RIGHT AWAY IF:  The inhaler medicine does not help your wheezing or shortness of breath or you have tightness in your chest.  You have dizziness, headaches, or fast heart rate.  You have chills, fever, or night sweats.  You have a large increase of thick spit, or your thick spit is bloody. MAKE SURE YOU:   Understand these instructions.  Will watch your condition.  Will get help right away if you are not doing well or get worse. Document Released: 01/11/2008 Document Revised: 01/22/2013 Document Reviewed: 10/31/2012 [facility] Patient Information 2015 [place], [place]. This information is not intended to replace advice given to you by your health care provider. Make sure you discuss any questions you have with your health care provider.  Chronic Obstructive Pulmonary Disease Chronic obstructive pulmonary disease (COPD) is a common lung problem. In COPD, the flow of air from the lungs is limited. The way your lungs work will probably never return to normal, but there are things you can do to improve your lungs and make yourself feel better. HOME CARE  Take all medicines as told by your doctor.  Avoid medicines or cough syrups that dry up your airway (such as antihistamines) and do not allow you to get rid of thick spit. You do not need to avoid them if told differently by your doctor.  If you smoke, stop. Smoking makes the problem worse.  Avoid being around things that make your breathing worse (like smoke,  chemicals, and fumes).  Use oxygen therapy and therapy to help improve your lungs (pulmonary rehabilitation) if told by your doctor. If you need home oxygen therapy, ask your doctor if you should buy a tool to measure your oxygen level (oximeter).  Avoid people who have a sickness you can catch (contagious).  Avoid going outside when it is very hot, cold, or humid.  Eat healthy foods. Eat smaller meals more often. Rest before meals.  Stay active, but remember to also rest.  Make sure to get all the shots (vaccines) your doctor recommends. Ask your doctor if you need a pneumonia shot.  Learn and use tips on how to relax.  Learn and use tips on how  to control your breathing as told by your doctor. Try:  Breathing in (inhaling) through your nose for 1 second. Then, pucker your lips and breath out (exhale) through your lips for 2 seconds.  Putting one hand on your belly (abdomen). Breathe in slowly through your nose for 1 second. Your hand on your belly should move out. Pucker your lips and breathe out slowly through your lips. Your hand on your belly should move in as you breathe out.  Learn and use controlled coughing to clear thick spit from your lungs. The steps are: 11. Lean your head a little forward. 12. Breathe in deeply. 13. Try to hold your breath for 3 seconds. 14. Keep your mouth slightly open while coughing 2 times. 15. Spit any thick spit out into a tissue. 16. Rest and do the steps again 1 or 2 times as needed. GET HELP IF:  You cough up more thick spit than usual.  There is a change in the color or thickness of the spit.  It is harder to breathe than usual.  Your breathing is faster than usual. GET HELP RIGHT AWAY IF:   You have shortness of breath while resting.  You have shortness of breath that stops you from:  Being able to talk.  Doing normal activities.  You chest hurts for longer than 5 minutes.  Your skin color is more blue than usual.  Your  pulse oximeter shows that you have low oxygen for longer than 5 minutes. MAKE SURE YOU:   Understand these instructions.  Will watch your condition.  Will get help right away if you are not doing well or get worse. Document Released: 09/20/2007 Document Revised: 08/18/2013 Document Reviewed: 11/28/2012 [facility] Patient Information 2015 [place], [place]. This information is not intended to replace advice given to you by your health care provider. Make sure you discuss any questions you have with your health care provider.

## 2014-06-06 NOTE — ED Notes (Signed)
HAD SYMPTOMS  OF  SINUS  CONGESTION  AND  DRAINAGE  FOR   4  DAYS        HAD  FEVER       EARLIER  THIS  WEEK             nOW  HAS  SINUS  DRAINAGE   AND  CONGESTION

## 2014-06-06 NOTE — ED Provider Notes (Addendum)
CSN: [id number]     Arrival date & time 06/06/14  0902 History   First MD Initiated Contact with Patient 06/06/14 [phone]     Chief Complaint  Patient presents with  . URI   (Consider location/radiation/quality/duration/timing/severity/associated sxs/prior Treatment) HPI Comments: 59 year old obese male with COPD complains of upper respiratory symptoms and congestion since November. His been seen approximately 3 times for the same complaint. He has not follow-up with his PCP. Symptoms include nasal congestion, head congestion, runny nose and wheezing. He has a history of smoking, hypertension COPD, diastolic dysfunction, OSA on CPAP, and obesity.    Past Medical History  Diagnosis Date  . Hypertension   . Obesity   . Smoker   . OSA on CPAP     ahi-84, 18 CWP  . Diastolic dysfunction   . COPD (chronic obstructive pulmonary disease)    Past Surgical History  Procedure Laterality Date  . Tonsillectomy  1967   Family History  Problem Relation Age of Onset  . Hypertension Mother   . Hypertension Father    History  Substance Use Topics  . Smoking status: Current Every Day Smoker    Types: Cigarettes  . Smokeless tobacco: Not on file  . Alcohol Use: No    Review of Systems  Constitutional: Positive for activity change. Negative for fever, diaphoresis and fatigue.       States he had a fever 3 days ago for short time but this was temporary and has not had a fever since.  HENT: Positive for congestion, postnasal drip, rhinorrhea, sneezing, sore throat and trouble swallowing. Negative for ear pain and facial swelling.   Eyes: Negative for pain, discharge and redness.  Respiratory: Positive for cough and wheezing. Negative for chest tightness and shortness of breath.   Cardiovascular: Negative.   Gastrointestinal: Negative.   Musculoskeletal: Negative.  Negative for neck pain and neck stiffness.  Neurological: Negative.     Allergies  Penicillins  Home Medications   Prior to  Admission medications   Medication Sig Start Date End Date Taking? Authorizing Provider  albuterol (PROVENTIL HFA;VENTOLIN HFA) 108 (90 BASE) MCG/ACT inhaler Inhale 2 puffs into the lungs 4 (four) times daily. 04/06/14   [name], MD  hydrochlorothiazide (HYDRODIURIL) 25 MG tablet TAKE 1 TABLET BY MOUTH EVERY DAY 03/30/14   [name], MD  ipratropium (ATROVENT) 0.06 % nasal spray Place 2 sprays into both nostrils 4 (four) times daily. 06/06/14   [name], NP  lisinopril (PRINIVIL,ZESTRIL) 20 MG tablet TAKE 1 TABLET BY MOUTH EVERY DAY 03/30/14   [name], MD  NEXIUM 40 MG capsule TAKE ONE CAPSULE BY MOUTH EVERY DAY 03/30/14   [name], MD  predniSONE (DELTASONE) 20 MG tablet Take 3 tabs po on first day, 2 tabs second day, 2 tabs third day, 1 tab fourth day, 1 tab 5th day. Take with food. 06/06/14   [name], NP   BP 129/62 mmHg  Pulse 65  Temp(Src) 97.8 F (36.6 C) (Oral)  Resp 22  SpO2 95% Physical Exam  Constitutional: He is oriented to person, place, and time. He appears well-developed and well-nourished. No distress.  Sitting on the table with relaxed posturing. Does not appear to be in any distress. No respiratory distress noted.  HENT:  Bilateral TMs are normal Oropharynx with minor erythema and clear PND. No exudates. Airway widely patent.  Eyes: Conjunctivae and EOM are normal.  Neck: Neck supple.  Cardiovascular: Normal rate, regular rhythm, normal heart  sounds and intact distal pulses.   Pulmonary/Chest: Effort normal. No respiratory distress.  Bilateral diffuse expiratory wheezing. Prolonged expiratory phase.  Musculoskeletal: Normal range of motion.  Lymphadenopathy:    He has no cervical adenopathy.  Neurological: He is alert and oriented to person, place, and time. He exhibits normal muscle tone.  Skin: Skin is warm and dry. He is not diaphoretic.  Psychiatric: He has a normal mood and affect.  Nursing note and vitals reviewed.   ED  Course  Procedures (including critical care time) Labs Review Labs Reviewed - No data to display  Imaging Review No results found.   MDM   1. URI (upper respiratory infection)   2. COPD with exacerbation    After the first full DuoNeb dose patient continues to have expiratory coarseness and rhonchi diffuse bilaterally. He states is feeling a little bit better but the subjective affect is modest. Is having no apparent shortness of breath while at rest. Repeat albuterol 5 mg neb at 1015 hrs. 1030 hrs. Patient has completed a second albuterol neb. There is significant improvement in air movement and diminishment of wheezes. There is improved air flow. He is currently not coughing. Solumedrol 125mg  IM Will have him start taking either Allegra, or Claritin or Zyrtec for drainage. Atrovent nasal spray for runny nose Drink lots of fluids. Continue to use albuterol HFA 2 puffs every 4 hours as needed Prednisone taper dose For fever, increased shortness of breath and cough sig medical attention promptly. Otherwise call Monday for an appointment with your primary care doctor.    [name], NP 06/06/14 1042  [name], NP 06/08/14 [phone]

## 2014-06-26 ENCOUNTER — Other Ambulatory Visit: Payer: Self-pay | Admitting: Family Medicine

## 2014-06-26 NOTE — Telephone Encounter (Signed)
Refill appropriate and filled per protocol. 

## 2014-07-20 ENCOUNTER — Ambulatory Visit (INDEPENDENT_AMBULATORY_CARE_PROVIDER_SITE_OTHER): Payer: Managed Care, Other (non HMO) | Admitting: Family Medicine

## 2014-07-20 ENCOUNTER — Encounter: Payer: Self-pay | Admitting: Family Medicine

## 2014-07-20 ENCOUNTER — Ambulatory Visit (HOSPITAL_COMMUNITY)
Admission: RE | Admit: 2014-07-20 | Discharge: 2014-07-20 | Disposition: A | Discharge status: Home / Self Care | Payer: Managed Care, Other (non HMO) | Source: Ambulatory Visit | Attending: Family Medicine | Admitting: Family Medicine

## 2014-07-20 VITALS — BP 130/72 | HR 88 | Temp 101.5°F | Resp 22 | Wt 327.0 lb

## 2014-07-20 DIAGNOSIS — J189 Pneumonia, unspecified organism: Secondary | ICD-10-CM | POA: Diagnosis not present

## 2014-07-20 DIAGNOSIS — R05 Cough: Secondary | ICD-10-CM | POA: Diagnosis not present

## 2014-07-20 MED ORDER — LEVOFLOXACIN 750 MG PO TABS: 750.0000 mg | ORAL_TABLET | Freq: Every day | ORAL | Status: DC

## 2014-07-20 NOTE — Progress Notes (Signed)
Subjective:    Patient ID: James Branch, male    DOB: 1955-12-13, 59 y.o.   MRN: [id number]  HPI Patient symptoms began Friday with a fever to 101 and a severe cough. He reports shortness of breath. He reports dyspnea on exertion. Cough is nonproductive. He reports mild pleurisy. He denies any angina. He denies any hemoptysis. He does have some postnasal drip and nasal drainage. He reports significant chest congestion. On exam today the patient is febrile. Pulse oximetry is 93% on room air. He has faint right basilar crackles and right-sided only wheezing on expiration. His left lung is completely clear. Past Medical History  Diagnosis Date  . Hypertension   . Obesity   . Smoker   . OSA on CPAP     ahi-84, 18 CWP  . Diastolic dysfunction   . COPD (chronic obstructive pulmonary disease)    Past Surgical History  Procedure Laterality Date  . Tonsillectomy  1967   Current Outpatient Prescriptions on File Prior to Visit  Medication Sig Dispense Refill  . albuterol (PROVENTIL HFA;VENTOLIN HFA) 108 (90 BASE) MCG/ACT inhaler Inhale 2 puffs into the lungs 4 (four) times daily. 1 Inhaler 3  . hydrochlorothiazide (HYDRODIURIL) 25 MG tablet TAKE 1 TABLET BY MOUTH EVERY DAY 90 tablet 0  . ipratropium (ATROVENT) 0.06 % nasal spray Place 2 sprays into both nostrils 4 (four) times daily. 15 mL 12  . lisinopril (PRINIVIL,ZESTRIL) 20 MG tablet TAKE 1 TABLET BY MOUTH EVERY DAY 90 tablet 0  . NEXIUM 40 MG capsule TAKE ONE CAPSULE BY MOUTH EVERY DAY 90 capsule 0   No current facility-administered medications on file prior to visit.   Allergies  Allergen Reactions  . Penicillins Swelling   History   Social History  . Marital Status: Single    Spouse Name: N/A  . Number of Children: N/A  . Years of Education: N/A   Occupational History  . Not on file.   Social History Main Topics  . Smoking status: Current Every Day Smoker    Types: Cigarettes  . Smokeless tobacco: Not on file  .  Alcohol Use: No  . Drug Use: No  . Sexual Activity: Yes    Birth Control/ Protection: Condom   Other Topics Concern  . Not on file   Social History Narrative      Review of Systems  All other systems reviewed and are negative.      Objective:   Physical Exam  HENT:  Right Ear: External ear normal.  Left Ear: External ear normal.  Nose: Nose normal.  Mouth/Throat: Oropharynx is clear and moist. No oropharyngeal exudate.  Eyes: Conjunctivae are normal.  Neck: Neck supple. No JVD present.  Cardiovascular: Normal rate, regular rhythm and normal heart sounds.   No murmur heard. Pulmonary/Chest: Effort normal. He has wheezes. He has rales.  Abdominal: Soft. Bowel sounds are normal.  Lymphadenopathy:    He has no cervical adenopathy.  Vitals reviewed.         Assessment & Plan:  CAP (community acquired pneumonia) - Plan: levofloxacin (LEVAQUIN) 750 MG tablet, DG Chest 2 View  I believe the patient is developing community-acquired right lower lobe pneumonia. Begin Levaquin 750 mg by mouth daily for 7 days. Recheck in 48 hours or sooner if worse. I would like the patient also to go get a chest x-ray immediately to confirm a suspicion. The present time he is not wheezing sufficiently to require prednisone. If the wheezing worsens I would  also add prednisone but at the present time I'll treat the patient as community-acquired pneumonia only

## 2014-07-23 ENCOUNTER — Encounter: Payer: Self-pay | Admitting: Family Medicine

## 2014-07-23 ENCOUNTER — Ambulatory Visit (INDEPENDENT_AMBULATORY_CARE_PROVIDER_SITE_OTHER): Payer: Managed Care, Other (non HMO) | Admitting: Family Medicine

## 2014-07-23 VITALS — BP 98/54 | HR 74 | Temp 97.8°F | Resp 26 | Wt 325.0 lb

## 2014-07-23 DIAGNOSIS — J4541 Moderate persistent asthma with (acute) exacerbation: Secondary | ICD-10-CM | POA: Diagnosis not present

## 2014-07-23 DIAGNOSIS — J441 Chronic obstructive pulmonary disease with (acute) exacerbation: Secondary | ICD-10-CM | POA: Diagnosis not present

## 2014-07-23 MED ORDER — ALBUTEROL SULFATE HFA 108 (90 BASE) MCG/ACT IN AERS: 2.0000 | INHALATION_SPRAY | Freq: Four times a day (QID) | RESPIRATORY_TRACT | Status: DC

## 2014-07-23 MED ORDER — PREDNISONE 20 MG PO TABS: 40.0000 mg | ORAL_TABLET | Freq: Every day | ORAL | Status: DC

## 2014-07-23 MED ORDER — METHYLPREDNISOLONE ACETATE 40 MG/ML IJ SUSP
60.0000 mg | Freq: Once | INTRAMUSCULAR | Status: AC
  Administered 2014-07-23: 60 mg via INTRAMUSCULAR

## 2014-07-23 NOTE — Progress Notes (Signed)
Subjective:    Patient ID: James Branch, male    DOB: 1955-06-19, 59 y.o.   MRN: [id number]  HPI 07/20/14 Patient symptoms began Friday with a fever to 101 and a severe cough. He reports shortness of breath. He reports dyspnea on exertion. Cough is nonproductive. He reports mild pleurisy. He denies any angina. He denies any hemoptysis. He does have some postnasal drip and nasal drainage. He reports significant chest congestion. On exam today the patient is febrile. Pulse oximetry is 93% on room air. He has faint right basilar crackles and right-sided only wheezing on expiration. His left lung is completely clear. At that time, my plan was: I believe the patient is developing community-acquired right lower lobe pneumonia. Begin Levaquin 750 mg by mouth daily for 7 days. Recheck in 48 hours or sooner if worse. I would like the patient also to go get a chest x-ray immediately to confirm a suspicion. The present time he is not wheezing sufficiently to require prednisone. If the wheezing worsens I would also add prednisone but at the present time I'll treat the patient as community-acquired pneumonia only  07/23/14 Patient's fever has gone away. His cough is slightly better. Chest x-ray revealed no pneumonia. However he is audibly wheezing severely today on exam. This is much worse than it was last time. He has not been using his albuterol because he has been out of his albuterol. He also continues to smoke. Past Medical History  Diagnosis Date  . Hypertension   . Obesity   . Smoker   . OSA on CPAP     ahi-84, 18 CWP  . Diastolic dysfunction   . COPD (chronic obstructive pulmonary disease)    Past Surgical History  Procedure Laterality Date  . Tonsillectomy  1967   Current Outpatient Prescriptions on File Prior to Visit  Medication Sig Dispense Refill  . hydrochlorothiazide (HYDRODIURIL) 25 MG tablet TAKE 1 TABLET BY MOUTH EVERY DAY 90 tablet 0  . ipratropium (ATROVENT) 0.06 % nasal spray  Place 2 sprays into both nostrils 4 (four) times daily. 15 mL 12  . levofloxacin (LEVAQUIN) 750 MG tablet Take 1 tablet (750 mg total) by mouth daily. 7 tablet 0  . lisinopril (PRINIVIL,ZESTRIL) 20 MG tablet TAKE 1 TABLET BY MOUTH EVERY DAY 90 tablet 0  . NEXIUM 40 MG capsule TAKE ONE CAPSULE BY MOUTH EVERY DAY 90 capsule 0   No current facility-administered medications on file prior to visit.   Allergies  Allergen Reactions  . Penicillins Swelling   History   Social History  . Marital Status: Single    Spouse Name: N/A  . Number of Children: N/A  . Years of Education: N/A   Occupational History  . Not on file.   Social History Main Topics  . Smoking status: Current Every Day Smoker    Types: Cigarettes  . Smokeless tobacco: Not on file  . Alcohol Use: No  . Drug Use: No  . Sexual Activity: Yes    Birth Control/ Protection: Condom   Other Topics Concern  . Not on file   Social History Narrative      Review of Systems  All other systems reviewed and are negative.      Objective:   Physical Exam  HENT:  Right Ear: External ear normal.  Left Ear: External ear normal.  Nose: Nose normal.  Mouth/Throat: Oropharynx is clear and moist. No oropharyngeal exudate.  Eyes: Conjunctivae are normal.  Neck: Neck supple. No JVD  present.  Cardiovascular: Normal rate, regular rhythm and normal heart sounds.   No murmur heard. Pulmonary/Chest: Effort normal. He has wheezes. He has rales.  Abdominal: Soft. Bowel sounds are normal.  Lymphadenopathy:    He has no cervical adenopathy.  Vitals reviewed.  Patient's wheezes are worse than his previous exam       Assessment & Plan:  COPD exacerbation - Plan: predniSONE (DELTASONE) 20 MG tablet  Asthma with acute exacerbation, moderate persistent - Plan: albuterol (PROVENTIL HFA;VENTOLIN HFA) 108 (90 BASE) MCG/ACT inhaler  continue Levaquin. However I'm going to add prednisone 40 mg a day for 7 days. I will also give the  patient Depo-Medrol 60 mg IM 1 today. I recommended he use albuterol 2 puffs inhaled every 6 hours. Recheck next week.

## 2014-07-23 NOTE — Addendum Note (Signed)
Addended by: Shary Decamp B on: 07/23/2014 04:26 PM   Modules accepted: Orders

## 2014-07-28 ENCOUNTER — Encounter: Payer: Self-pay | Admitting: Family Medicine

## 2014-07-28 ENCOUNTER — Ambulatory Visit (INDEPENDENT_AMBULATORY_CARE_PROVIDER_SITE_OTHER): Payer: Managed Care, Other (non HMO) | Admitting: Family Medicine

## 2014-07-28 VITALS — BP 110/60 | HR 86 | Temp 97.8°F | Resp 22 | Wt 330.0 lb

## 2014-07-28 DIAGNOSIS — J441 Chronic obstructive pulmonary disease with (acute) exacerbation: Secondary | ICD-10-CM | POA: Diagnosis not present

## 2014-07-28 NOTE — Progress Notes (Signed)
Subjective:    Patient ID: James Branch, male    DOB: 1955-07-25, 59 y.o.   MRN: [id number]  HPI 07/20/14 Patient symptoms began Friday with a fever to 101 and a severe cough. He reports shortness of breath. He reports dyspnea on exertion. Cough is nonproductive. He reports mild pleurisy. He denies any angina. He denies any hemoptysis. He does have some postnasal drip and nasal drainage. He reports significant chest congestion. On exam today the patient is febrile. Pulse oximetry is 93% on room air. He has faint right basilar crackles and right-sided only wheezing on expiration. His left lung is completely clear. At that time, my plan was: I believe the patient is developing community-acquired right lower lobe pneumonia. Begin Levaquin 750 mg by mouth daily for 7 days. Recheck in 48 hours or sooner if worse. I would like the patient also to go get a chest x-ray immediately to confirm a suspicion. The present time he is not wheezing sufficiently to require prednisone. If the wheezing worsens I would also add prednisone but at the present time I'll treat the patient as community-acquired pneumonia only  07/23/14 Patient's fever has gone away. His cough is slightly better. Chest x-ray revealed no pneumonia. However he is audibly wheezing severely today on exam. This is much worse than it was last time. He has not been using his albuterol because he has been out of his albuterol. He also continues to smoke.  At that time,my plan was:  continue Levaquin. However I'm going to add prednisone 40 mg a day for 7 days. I will also give the patient Depo-Medrol 60 mg IM 1 today. I recommended he use albuterol 2 puffs inhaled every 6 hours. Recheck next week.  07/28/14 he is here today for recheck.  His breathing is much better. He continues to have expiratory wheezes but on examination his wheezing is much proved compared to last week. He continues to have bibasilar wheezing which is faint. There is no evidence of  respiratory distress. He denies any fevers or chills. Past Medical History  Diagnosis Date  . Hypertension   . Obesity   . Smoker   . OSA on CPAP     ahi-84, 18 CWP  . Diastolic dysfunction   . COPD (chronic obstructive pulmonary disease)    Past Surgical History  Procedure Laterality Date  . Tonsillectomy  1967   Current Outpatient Prescriptions on File Prior to Visit  Medication Sig Dispense Refill  . albuterol (PROVENTIL HFA;VENTOLIN HFA) 108 (90 BASE) MCG/ACT inhaler Inhale 2 puffs into the lungs 4 (four) times daily. 1 Inhaler 3  . hydrochlorothiazide (HYDRODIURIL) 25 MG tablet TAKE 1 TABLET BY MOUTH EVERY DAY 90 tablet 0  . ipratropium (ATROVENT) 0.06 % nasal spray Place 2 sprays into both nostrils 4 (four) times daily. 15 mL 12  . lisinopril (PRINIVIL,ZESTRIL) 20 MG tablet TAKE 1 TABLET BY MOUTH EVERY DAY 90 tablet 0  . NEXIUM 40 MG capsule TAKE ONE CAPSULE BY MOUTH EVERY DAY 90 capsule 0  . predniSONE (DELTASONE) 20 MG tablet Take 2 tablets (40 mg total) by mouth daily with breakfast. 14 tablet 0   No current facility-administered medications on file prior to visit.   Allergies  Allergen Reactions  . Penicillins Swelling   History   Social History  . Marital Status: Single    Spouse Name: N/A  . Number of Children: N/A  . Years of Education: N/A   Occupational History  . Not on file.  Social History Main Topics  . Smoking status: Current Every Day Smoker    Types: Cigarettes  . Smokeless tobacco: Not on file  . Alcohol Use: No  . Drug Use: No  . Sexual Activity: Yes    Birth Control/ Protection: Condom   Other Topics Concern  . Not on file   Social History Narrative      Review of Systems  All other systems reviewed and are negative.      Objective:   Physical Exam  HENT:  Right Ear: External ear normal.  Left Ear: External ear normal.  Nose: Nose normal.  Mouth/Throat: Oropharynx is clear and moist. No oropharyngeal exudate.  Eyes:  Conjunctivae are normal.  Neck: Neck supple. No JVD present.  Cardiovascular: Normal rate, regular rhythm and normal heart sounds.   No murmur heard. Pulmonary/Chest: Effort normal. No respiratory distress. He has wheezes. He has no rales.  Abdominal: Soft. Bowel sounds are normal.  Lymphadenopathy:    He has no cervical adenopathy.  Vitals reviewed.         Assessment & Plan:  COPD exacerbation patient is clinically improved. I do not feel that he requires further antibiotics or further steroids. He has not smoked now in almost one week. I congratulated the patient on that. I encouraged him to continue with smoking cessation. I will start the patient on Symbicort 160/4.5, 2 puffs inhaled twice a day. Recheck in one week if not 100% better. He is to continue the Symbicort indefinitely

## 2014-07-30 ENCOUNTER — Ambulatory Visit: Payer: Managed Care, Other (non HMO) | Admitting: Family Medicine

## 2014-08-04 ENCOUNTER — Encounter: Payer: Managed Care, Other (non HMO) | Admitting: Family Medicine

## 2014-08-04 NOTE — Progress Notes (Signed)
Subjective:    Patient ID: James Branch, male    DOB: 03/13/1956, 59 y.o.   MRN: [id number]  HPI 07/20/14 Patient symptoms began Friday with a fever to 101 and a severe cough. He reports shortness of breath. He reports dyspnea on exertion. Cough is nonproductive. He reports mild pleurisy. He denies any angina. He denies any hemoptysis. He does have some postnasal drip and nasal drainage. He reports significant chest congestion. On exam today the patient is febrile. Pulse oximetry is 93% on room air. He has faint right basilar crackles and right-sided only wheezing on expiration. His left lung is completely clear. At that time, my plan was: I believe the patient is developing community-acquired right lower lobe pneumonia. Begin Levaquin 750 mg by mouth daily for 7 days. Recheck in 48 hours or sooner if worse. I would like the patient also to go get a chest x-ray immediately to confirm a suspicion. The present time he is not wheezing sufficiently to require prednisone. If the wheezing worsens I would also add prednisone but at the present time I'll treat the patient as community-acquired pneumonia only  07/23/14 Patient's fever has gone away. His cough is slightly better. Chest x-ray revealed no pneumonia. However he is audibly wheezing severely today on exam. This is much worse than it was last time. He has not been using his albuterol because he has been out of his albuterol. He also continues to smoke.  At that time,my plan was:  continue Levaquin. However I'm going to add prednisone 40 mg a day for 7 days. I will also give the patient Depo-Medrol 60 mg IM 1 today. I recommended he use albuterol 2 puffs inhaled every 6 hours. Recheck next week.  07/28/14 He is here today for recheck.  His breathing is much better. He continues to have expiratory wheezes but on examination his wheezing is much proved compared to last week. He continues to have bibasilar wheezing which is faint. There is no evidence of  respiratory distress. He denies any fevers or chills.  At that time, my plan was:  Patient is clinically improved. I do not feel that he requires further antibiotics or further steroids. He has not smoked now in almost one week. I congratulated the patient on that. I encouraged him to continue with smoking cessation. I will start the patient on Symbicort 160/4.5, 2 puffs inhaled twice a day. Recheck in one week if not 100% better. He is to continue the Symbicort indefinitely.  08/04/14 Past Medical History  Diagnosis Date  . Hypertension   . Obesity   . Smoker   . OSA on CPAP     ahi-84, 18 CWP  . Diastolic dysfunction   . COPD (chronic obstructive pulmonary disease)    Past Surgical History  Procedure Laterality Date  . Tonsillectomy  1967   Current Outpatient Prescriptions on File Prior to Visit  Medication Sig Dispense Refill  . albuterol (PROVENTIL HFA;VENTOLIN HFA) 108 (90 BASE) MCG/ACT inhaler Inhale 2 puffs into the lungs 4 (four) times daily. 1 Inhaler 3  . hydrochlorothiazide (HYDRODIURIL) 25 MG tablet TAKE 1 TABLET BY MOUTH EVERY DAY 90 tablet 0  . ipratropium (ATROVENT) 0.06 % nasal spray Place 2 sprays into both nostrils 4 (four) times daily. 15 mL 12  . lisinopril (PRINIVIL,ZESTRIL) 20 MG tablet TAKE 1 TABLET BY MOUTH EVERY DAY 90 tablet 0  . NEXIUM 40 MG capsule TAKE ONE CAPSULE BY MOUTH EVERY DAY 90 capsule 0  . predniSONE (  DELTASONE) 20 MG tablet Take 2 tablets (40 mg total) by mouth daily with breakfast. 14 tablet 0   No current facility-administered medications on file prior to visit.   Allergies  Allergen Reactions  . Penicillins Swelling   History   Social History  . Marital Status: Single    Spouse Name: N/A  . Number of Children: N/A  . Years of Education: N/A   Occupational History  . Not on file.   Social History Main Topics  . Smoking status: Current Every Day Smoker    Types: Cigarettes  . Smokeless tobacco: Not on file  . Alcohol Use: No  .  Drug Use: No  . Sexual Activity: Yes    Birth Control/ Protection: Condom   Other Topics Concern  . Not on file   Social History Narrative      Review of Systems  All other systems reviewed and are negative.      Objective:   Physical Exam  HENT:  Right Ear: External ear normal.  Left Ear: External ear normal.  Nose: Nose normal.  Mouth/Throat: Oropharynx is clear and moist. No oropharyngeal exudate.  Eyes: Conjunctivae are normal.  Neck: Neck supple. No JVD present.  Cardiovascular: Normal rate, regular rhythm and normal heart sounds.   No murmur heard. Pulmonary/Chest: Effort normal. No respiratory distress. He has wheezes. He has no rales.  Abdominal: Soft. Bowel sounds are normal.  Lymphadenopathy:    He has no cervical adenopathy.  Vitals reviewed.         Assessment & Plan:

## 2014-08-29 ENCOUNTER — Other Ambulatory Visit: Payer: Self-pay | Admitting: Family Medicine

## 2014-09-23 ENCOUNTER — Other Ambulatory Visit: Payer: Self-pay | Admitting: Family Medicine

## 2014-10-07 ENCOUNTER — Ambulatory Visit: Payer: Managed Care, Other (non HMO)

## 2014-10-07 ENCOUNTER — Other Ambulatory Visit: Payer: Self-pay | Admitting: Family Medicine

## 2014-10-07 MED ORDER — ZOSTER VACCINE LIVE 19400 UNT/0.65ML ~~LOC~~ SOLR: 0.6500 mL | Freq: Once | SUBCUTANEOUS | Status: DC

## 2014-10-07 MED ORDER — TETANUS-DIPHTH-ACELL PERTUSSIS 5-2-15.5 LF-MCG/0.5 IM SUSP: 0.5000 mL | Freq: Once | INTRAMUSCULAR | Status: DC

## 2014-10-26 ENCOUNTER — Ambulatory Visit (INDEPENDENT_AMBULATORY_CARE_PROVIDER_SITE_OTHER): Payer: Managed Care, Other (non HMO) | Admitting: *Deleted

## 2014-10-26 DIAGNOSIS — Z23 Encounter for immunization: Secondary | ICD-10-CM

## 2015-03-28 ENCOUNTER — Other Ambulatory Visit: Payer: Self-pay | Admitting: Family Medicine

## 2015-04-22 ENCOUNTER — Encounter: Payer: Self-pay | Admitting: Family Medicine

## 2015-04-22 ENCOUNTER — Ambulatory Visit (INDEPENDENT_AMBULATORY_CARE_PROVIDER_SITE_OTHER): Payer: Managed Care, Other (non HMO) | Admitting: Family Medicine

## 2015-04-22 VITALS — BP 100/62 | HR 76 | Temp 98.0°F | Resp 22 | Ht 70.0 in | Wt 330.0 lb

## 2015-04-22 DIAGNOSIS — J4530 Mild persistent asthma, uncomplicated: Secondary | ICD-10-CM | POA: Diagnosis not present

## 2015-04-22 MED ORDER — PREDNISONE 20 MG PO TABS: ORAL_TABLET | ORAL | Status: DC

## 2015-04-22 NOTE — Progress Notes (Signed)
   Subjective:    Patient ID: James Branch, male    DOB: 02-28-1956, 60 y.o.   MRN: [id number]  HPI  patient is a very pleasant 60 year old white male with a history of morbid obesity , obstructive sleep apnea, and long-standing  Tobacco abuse who presents today with worsening cough, wheezing, shortness of breath. He denies any fever or chills. On examination today, he has diminished breath sounds bilaterally with expiratory wheezing. Once year he develops a "bronchitis" requiring prednisone. I believe he has underlying COPD secondary to his smoking. Past Medical History  Diagnosis Date  . Hypertension   . Obesity   . Smoker   . OSA on CPAP     ahi-84, 18 CWP  . Diastolic dysfunction   . COPD (chronic obstructive pulmonary disease) [facility])    Past Surgical History  Procedure Laterality Date  . Tonsillectomy  1967   Current Outpatient Prescriptions on File Prior to Visit  Medication Sig Dispense Refill  . albuterol (PROVENTIL HFA;VENTOLIN HFA) 108 (90 BASE) MCG/ACT inhaler Inhale 2 puffs into the lungs 4 (four) times daily. 1 Inhaler 3  . esomeprazole (NEXIUM) 40 MG capsule TAKE ONE CAPSULE BY MOUTH EVERY DAY 90 capsule 4  . hydrochlorothiazide (HYDRODIURIL) 25 MG tablet TAKE 1 TABLET BY MOUTH EVERY DAY 90 tablet 1  . ipratropium (ATROVENT) 0.06 % nasal spray Place 2 sprays into both nostrils 4 (four) times daily. 15 mL 12  . lisinopril (PRINIVIL,ZESTRIL) 20 MG tablet TAKE 1 TABLET BY MOUTH EVERY DAY 90 tablet 1   No current facility-administered medications on file prior to visit.   Allergies  Allergen Reactions  . Penicillins Swelling   Social History   Social History  . Marital Status: Single    Spouse Name: N/A  . Number of Children: N/A  . Years of Education: N/A   Occupational History  . Not on file.   Social History Main Topics  . Smoking status: Current Every Day Smoker    Types: Cigarettes  . Smokeless tobacco: Not on file  . Alcohol Use: No  . Drug Use: No    . Sexual Activity: Yes    Birth Control/ Protection: Condom   Other Topics Concern  . Not on file   Social History Narrative      Review of Systems  All other systems reviewed and are negative.      Objective:   Physical Exam  Constitutional: He appears well-developed and well-nourished.  HENT:  Right Ear: External ear normal.  Left Ear: External ear normal.  Nose: Nose normal.  Mouth/Throat: Oropharynx is clear and moist. No oropharyngeal exudate.  Eyes: Conjunctivae are normal.  Neck: Neck supple.  Cardiovascular: Normal rate, regular rhythm and normal heart sounds.   Pulmonary/Chest: Effort normal. No respiratory distress. He has wheezes. He has no rales.  Lymphadenopathy:    He has no cervical adenopathy.  Vitals reviewed.         Assessment & Plan:  Asthmatic bronchitis, mild persistent, uncomplicated - Plan: predniSONE (DELTASONE) 20 MG tablet   Patient has asthmatic bronchitis  Versus COPD exacerbation. Begin prednisone taper pack. He can use albuterol 2 puffs inhaled every 6 hurs as needed. Strongly encouraged the patient to quit smoking. Consider pulmonary function test when the patient is well to determine if he has underlying COPD. Also recommended a complete physical exam as he is overdue. I would like him to return fasting for labs including a CMP, CBC, fasting lipid panel, and a PSA.

## 2015-04-23 ENCOUNTER — Other Ambulatory Visit: Payer: Managed Care, Other (non HMO)

## 2015-04-23 DIAGNOSIS — E669 Obesity, unspecified: Secondary | ICD-10-CM

## 2015-04-23 DIAGNOSIS — Z79899 Other long term (current) drug therapy: Secondary | ICD-10-CM

## 2015-04-23 DIAGNOSIS — Z125 Encounter for screening for malignant neoplasm of prostate: Secondary | ICD-10-CM

## 2015-04-23 DIAGNOSIS — F17200 Nicotine dependence, unspecified, uncomplicated: Secondary | ICD-10-CM

## 2015-04-23 DIAGNOSIS — I10 Essential (primary) hypertension: Secondary | ICD-10-CM

## 2015-04-23 LAB — COMPLETE METABOLIC PANEL WITH GFR
ALBUMIN: 3.8 g/dL (ref 3.6–5.1)
ALK PHOS: 50 U/L (ref 40–115)
ALT: 20 U/L (ref 9–46)
AST: 12 U/L (ref 10–35)
BUN: 17 mg/dL (ref 7–25)
CO2: 28 mmol/L (ref 20–31)
CREATININE: 0.89 mg/dL (ref 0.70–1.25)
Calcium: 9.3 mg/dL (ref 8.6–10.3)
Chloride: 104 mmol/L (ref 98–110)
GFR, Est African American: >89 mL/min (ref >=60)
GLUCOSE: 84 mg/dL (ref 70–99)
Potassium: 4.2 mmol/L (ref 3.5–5.3)
SODIUM: 139 mmol/L (ref 135–146)
TOTAL PROTEIN: 6.4 g/dL (ref 6.1–8.1)
Total Bilirubin: 0.4 mg/dL (ref 0.2–1.2)

## 2015-04-23 LAB — CBC WITH DIFFERENTIAL/PLATELET
BASOS ABS: 0 10*3/uL (ref 0.0–0.1)
Basophils Relative: 0 % (ref 0–1)
Eosinophils Absolute: 0 10*3/uL (ref 0.0–0.7)
Eosinophils Relative: 0 % (ref 0–5)
HEMATOCRIT: 43.6 % (ref 39.0–52.0)
HEMOGLOBIN: 15.1 g/dL (ref 13.0–17.0)
LYMPHS PCT: 24 % (ref 12–46)
Lymphs Abs: 3.4 10*3/uL (ref 0.7–4.0)
MCH: 31.5 pg (ref 26.0–34.0)
MCHC: 34.6 g/dL (ref 30.0–36.0)
MCV: 90.8 fL (ref 78.0–100.0)
MONO ABS: 1 10*3/uL (ref 0.1–1.0)
MPV: 11.8 fL (ref 8.6–12.4)
Monocytes Relative: 7 % (ref 3–12)
NEUTROS ABS: 9.8 10*3/uL — AB (ref 1.7–7.7)
NEUTROS PCT: 69 % (ref 43–77)
Platelets: 252 10*3/uL (ref 150–400)
RBC: 4.8 MIL/uL (ref 4.22–5.81)
RDW: 14.4 % (ref 11.5–15.5)
WBC: 14.2 10*3/uL — ABNORMAL HIGH (ref 4.0–10.5)

## 2015-04-23 LAB — LIPID PANEL
Cholesterol: 170 mg/dL (ref 125–200)
HDL: 48 mg/dL (ref >=40)
LDL CALC: 107 mg/dL (ref <130)
Total CHOL/HDL Ratio: 3.5 Ratio (ref <=5.0)
Triglycerides: 75 mg/dL (ref <150)
VLDL: 15 mg/dL (ref <30)

## 2015-04-24 LAB — PSA: PSA: 0.21 ng/mL (ref <=4.00)

## 2015-05-26 ENCOUNTER — Ambulatory Visit (INDEPENDENT_AMBULATORY_CARE_PROVIDER_SITE_OTHER): Payer: Managed Care, Other (non HMO) | Admitting: Family Medicine

## 2015-05-26 ENCOUNTER — Encounter: Payer: Self-pay | Admitting: Family Medicine

## 2015-05-26 VITALS — BP 140/74 | HR 78 | Temp 98.1°F | Resp 22 | Wt 330.0 lb

## 2015-05-26 DIAGNOSIS — N390 Urinary tract infection, site not specified: Secondary | ICD-10-CM | POA: Diagnosis not present

## 2015-05-26 LAB — URINALYSIS, ROUTINE W REFLEX MICROSCOPIC
Bilirubin Urine: NEGATIVE
GLUCOSE, UA: NEGATIVE
Ketones, ur: NEGATIVE
Nitrite: NEGATIVE
PROTEIN: NEGATIVE
SPECIFIC GRAVITY, URINE: 1.015 (ref 1.001–1.035)
pH: 7 (ref 5.0–8.0)

## 2015-05-26 LAB — URINALYSIS, MICROSCOPIC ONLY
CRYSTALS: NONE SEEN [HPF]
Casts: NONE SEEN [LPF]
Yeast: NONE SEEN [HPF]

## 2015-05-26 MED ORDER — CIPROFLOXACIN HCL 500 MG PO TABS: 500.0000 mg | ORAL_TABLET | Freq: Two times a day (BID) | ORAL | Status: DC

## 2015-05-26 NOTE — Progress Notes (Signed)
Patient ID: James Branch, male   DOB: 1956-03-24, 60 y.o.   MRN: [id number]    Subjective:    Patient ID: James Branch, male    DOB: 06-12-1955, 60 y.o.   MRN: [id number]  Patient presents for Burning & Frequent urination  she went burning with urination for the past few days. He's had other episodes about a month ago but he was WITH water and came very juice. He denies any odor to his urine states a few times a week dark in color but the past 3 days has been normal color. He has urinary frequency and burning when he first starts urinating. He denies any discharge from the penis. He does not have any BPH symptoms otherwise. He had a normal PSA a few weeks ago. Denies any abdominal pain fever nausea vomiting associated.    Review Of Systems:  GEN- denies fatigue, fever, weight loss,weakness, recent illness HEENT- denies eye drainage, change in vision, nasal discharge, CVS- denies chest pain, palpitations RESP- denies SOB, cough, wheeze ABD- denies N/V, change in stools, abd pain GU- +dysuria,  Denies hematuria, dribbling, incontinence Neuro- denies headache, dizziness, syncope, seizure activity       Objective:    BP 140/74 mmHg  Pulse 78  Temp(Src) 98.1 F (36.7 C) (Oral)  Resp 22  Wt 330 lb (149.687 kg) GEN- NAD, alert and oriented x3,obese  CVS- RRR, no murmur RESP- few scattered wheeze, normal WOB ABD-NABS,soft,NT,ND, no CVA tenderness  EXT- No edema Pulses- Radial - 2+        Assessment & Plan:      Problem List Items Addressed This Visit    None    Visit Diagnoses    UTI (lower urinary tract infection)    -  Primary    Treat with cipro in this male, significant infection on UA, send for culture, No DM, normal PSA    Relevant Orders    Urinalysis, Routine w reflex microscopic (not at [facility]) (Completed)    Urine culture       Note: This dictation was prepared with Dragon dictation along with smaller phrase technology. Any transcriptional errors that  result from this process are unintentional.

## 2015-05-26 NOTE — Patient Instructions (Signed)
Take antibiotics as prescribed COntinue current meds F/U as needed

## 2015-05-28 LAB — URINE CULTURE

## 2015-06-12 ENCOUNTER — Ambulatory Visit (INDEPENDENT_AMBULATORY_CARE_PROVIDER_SITE_OTHER): Payer: Managed Care, Other (non HMO)

## 2015-06-12 ENCOUNTER — Ambulatory Visit (INDEPENDENT_AMBULATORY_CARE_PROVIDER_SITE_OTHER): Payer: Managed Care, Other (non HMO) | Admitting: Internal Medicine

## 2015-06-12 VITALS — BP 108/68 | HR 72 | Temp 97.9°F | Resp 18 | Ht 69.25 in | Wt 331.0 lb

## 2015-06-12 DIAGNOSIS — R062 Wheezing: Secondary | ICD-10-CM

## 2015-06-12 DIAGNOSIS — R059 Cough, unspecified: Secondary | ICD-10-CM

## 2015-06-12 DIAGNOSIS — R509 Fever, unspecified: Secondary | ICD-10-CM | POA: Diagnosis not present

## 2015-06-12 DIAGNOSIS — R05 Cough: Secondary | ICD-10-CM

## 2015-06-12 MED ORDER — HYDROCODONE-HOMATROPINE 5-1.5 MG/5ML PO SYRP: 5.0000 mL | ORAL_SOLUTION | Freq: Four times a day (QID) | ORAL | Status: DC | PRN

## 2015-06-12 MED ORDER — AZITHROMYCIN 250 MG PO TABS: ORAL_TABLET | ORAL | Status: DC

## 2015-06-12 MED ORDER — PREDNISONE 20 MG PO TABS: ORAL_TABLET | ORAL | Status: DC

## 2015-06-12 MED ORDER — ALBUTEROL SULFATE (2.5 MG/3ML) 0.083% IN NEBU
2.5000 mg | INHALATION_SOLUTION | Freq: Once | RESPIRATORY_TRACT | Status: AC
  Administered 2015-06-12: 2.5 mg via RESPIRATORY_TRACT

## 2015-06-12 NOTE — Progress Notes (Addendum)
Subjective:  By signing my name below, I, James Branch, attest that this documentation has been prepared under the direction and in the presence of [name], MD.  Electronically Signed: Thea Alken, ED Scribe. 06/12/2015. 10:05 AM.   Patient ID: James Branch, male    DOB: 1955-05-29, 60 y.o.   MRN: [id number]  HPI Chief Complaint  Patient presents with  . Flu like symptoms  . Emesis  . Nasal Congestion  . Chills  . Cough    Non productive    HPI Comments: James Branch is a 60 y.o. male who presents to the [facility] complaining of cough. Pt states symptoms started 3 days ago with chills. Later that evening he developed fever and began to vomit. He stayed home the following day but had persistent chills, fever. He states the pat 2 days he's had cough, nasal congestion, body aches and chills.  Past Medical History  Diagnosis Date  . Hypertension   . Obesity   . Smoker   . OSA on CPAP     ahi-84, 18 CWP  . Diastolic dysfunction   . COPD (chronic obstructive pulmonary disease) [facility])    Past Surgical History  Procedure Laterality Date  . Tonsillectomy  1967   Prior to Admission medications   Medication Sig Start Date End Date Taking? Authorizing Provider  esomeprazole (NEXIUM) 40 MG capsule TAKE ONE CAPSULE BY MOUTH EVERY DAY 08/31/14  Yes [name], MD  hydrochlorothiazide (HYDRODIURIL) 25 MG tablet TAKE 1 TABLET BY MOUTH EVERY DAY 03/29/15  Yes [name], MD  lisinopril (PRINIVIL,ZESTRIL) 20 MG tablet TAKE 1 TABLET BY MOUTH EVERY DAY 03/29/15  Yes [name], MD  albuterol (PROVENTIL HFA;VENTOLIN HFA) 108 (90 BASE) MCG/ACT inhaler Inhale 2 puffs into the lungs 4 (four) times daily. Patient not taking: Reported on 06/12/2015 07/23/14   [name], MD  ciprofloxacin (CIPRO) 500 MG tablet Take 1 tablet (500 mg total) by mouth 2 (two) times daily. Patient not taking: Reported on 06/12/2015 05/26/15   [name], MD    ipratropium (ATROVENT) 0.06 % nasal spray Place 2 sprays into both nostrils 4 (four) times daily. Patient not taking: Reported on 06/12/2015 06/06/14   [name], NP   Review of Systems  Constitutional: Positive for fever and chills.  HENT: Positive for congestion. Negative for trouble swallowing.   Respiratory: Positive for cough and wheezing.   Gastrointestinal: Positive for vomiting.  Musculoskeletal: Positive for myalgias.    Objective:   Physical Exam  Constitutional: He is oriented to person, place, and time. He appears well-developed and well-nourished. No distress.  HENT:  Head: Normocephalic and atraumatic.  Nose: Rhinorrhea ( clear) present.  Mouth/Throat: Uvula is midline and oropharynx is clear and moist. No oropharyngeal exudate or posterior oropharyngeal erythema.  Eyes: Conjunctivae and EOM are normal.  Neck: Neck supple.  Cardiovascular: Normal rate.   Pulmonary/Chest: Effort normal. He has wheezes ( inspiration and expiration bilaterally).  Musculoskeletal: Normal range of motion.  Lymphadenopathy:    He has no cervical adenopathy.  Neurological: He is alert and oriented to person, place, and time.  Skin: Skin is warm and dry.  Psychiatric: He has a normal mood and affect. His behavior is normal.  Nursing note and vitals reviewed.   Filed Vitals:   06/12/15 0946  BP: 108/68  Pulse: 72  Temp: 97.9 F (36.6 C)  TempSrc: Oral  Resp: 18  Height: 5' 9.25" (1.759 m)  Weight: 331 lb (150.141 kg)  SpO2: 94%   [facility] reading (PRIMARY) by Dr. Laney Pastor.  CXR- no evidence of consolidation or infiltrate.  Meds ordered this encounter  Medications  . albuterol (PROVENTIL) (2.5 MG/3ML) 0.083% nebulizer solution 2.5 mg  this treatment reduced inspir wheezing    Dg Chest 2 View  06/12/2015  CLINICAL DATA:  Cough, fever for 3-4 days EXAM: CHEST  2 VIEW COMPARISON:  07/20/2014 FINDINGS: Mild peribronchial thickening. Heart and mediastinal contours are within normal  limits. No focal opacities or effusions. No acute bony abnormality. IMPRESSION: Mild bronchitic changes. Electronically Signed   By: [name] M.D.   On: 06/12/2015 10:39   Assessment & Plan:    1. Cough   2. Fever, unspecified fever cause   3. Wheezing   4.       Secondary to influenza  Meds ordered this encounter  Medications  . albuterol (PROVENTIL) (2.5 MG/3ML) 0.083% nebulizer solution 2.5 mg    Sig:   . predniSONE (DELTASONE) 20 MG tablet    Sig: 4/3/3/2/2/1/1 single daily dose for 7 days    Dispense:  16 tablet    Refill:  0  . HYDROcodone-homatropine (HYCODAN) 5-1.5 MG/5ML syrup    Sig: Take 5 mLs by mouth every 6 (six) hours as needed.    Dispense:  120 mL    Refill:  0  . azithromycin (ZITHROMAX) 250 MG tablet    Sig: As packaged    Dispense:  6 tablet    Refill:  0  fluids//bedrest/tylenol F/u PCP [name] I have completed the patient encounter in its entirety as documented by the scribe, with editing by me where necessary. [name], M.D.

## 2015-06-15 ENCOUNTER — Encounter: Payer: Self-pay | Admitting: Gastroenterology

## 2015-09-21 ENCOUNTER — Other Ambulatory Visit: Payer: Self-pay | Admitting: Family Medicine

## 2015-09-21 NOTE — Telephone Encounter (Signed)
Refill appropriate and filled per protocol. 

## 2015-09-23 ENCOUNTER — Other Ambulatory Visit: Payer: Self-pay | Admitting: Family Medicine

## 2015-09-23 NOTE — Telephone Encounter (Signed)
Refill appropriate and filled per protocol. 

## 2016-03-18 ENCOUNTER — Other Ambulatory Visit: Payer: Self-pay | Admitting: Family Medicine

## 2016-03-24 ENCOUNTER — Ambulatory Visit: Payer: Managed Care, Other (non HMO) | Admitting: Family Medicine

## 2016-03-28 ENCOUNTER — Ambulatory Visit (INDEPENDENT_AMBULATORY_CARE_PROVIDER_SITE_OTHER): Payer: Managed Care, Other (non HMO) | Admitting: Family Medicine

## 2016-03-28 ENCOUNTER — Encounter: Payer: Self-pay | Admitting: Family Medicine

## 2016-03-28 VITALS — BP 110/68 | HR 76 | Temp 98.2°F | Resp 18 | Ht 70.0 in | Wt 316.0 lb

## 2016-03-28 DIAGNOSIS — Z2911 Encounter for prophylactic immunotherapy for respiratory syncytial virus (RSV): Secondary | ICD-10-CM

## 2016-03-28 DIAGNOSIS — H8110 Benign paroxysmal vertigo, unspecified ear: Secondary | ICD-10-CM

## 2016-03-28 DIAGNOSIS — Z23 Encounter for immunization: Secondary | ICD-10-CM

## 2016-03-28 NOTE — Progress Notes (Signed)
Subjective:    Patient ID: James Branch, male    DOB: 03/11/1956, 60 y.o.   MRN: [id number]  HPI Patient reports that the room spins whenever he turns his head in a certain direction. He denies any sudden hearing loss. He denies any tinnitus. He denies any double vision or blurry vision. He denies any severe headache or head trauma. It does not occur every day. It has been happening off and on for last 2 weeks. He denies any ear pain. Cranial nerves II through XII are grossly intact muscle strength 5 over 5 equal and symmetric in the upper and lower extremities and cerebellar exam is normal with a normal Romberg and normal finger to nose testing. Past Medical History:  Diagnosis Date  . COPD (chronic obstructive pulmonary disease) ([employer])   . Diastolic dysfunction   . Hypertension   . Obesity   . OSA on CPAP    ahi-84, 18 CWP  . Smoker    Past Surgical History:  Procedure Laterality Date  . TONSILLECTOMY  1967   Current Outpatient Prescriptions on File Prior to Visit  Medication Sig Dispense Refill  . esomeprazole (NEXIUM) 40 MG capsule TAKE ONE CAPSULE BY MOUTH EVERY DAY 90 capsule 4  . hydrochlorothiazide (HYDRODIURIL) 25 MG tablet TAKE 1 TABLET BY MOUTH EVERY DAY 90 tablet 3  . lisinopril (PRINIVIL,ZESTRIL) 20 MG tablet TAKE 1 TABLET BY MOUTH EVERY DAY 90 tablet 3  . PROAIR HFA 108 (90 Base) MCG/ACT inhaler INHALE 2 PUFFS INTO THE LUNGS 4 (FOUR) TIMES DAILY. 8.5 Inhaler 11   No current facility-administered medications on file prior to visit.    Allergies  Allergen Reactions  . Penicillins Swelling   Social History   Social History  . Marital status: Single    Spouse name: N/A  . Number of children: N/A  . Years of education: N/A   Occupational History  . Not on file.   Social History Main Topics  . Smoking status: Former Smoker    Types: Cigarettes  . Smokeless tobacco: Not on file  . Alcohol use No  . Drug use: No  . Sexual activity: Yes    Birth control/  protection: Condom   Other Topics Concern  . Not on file   Social History Narrative  . No narrative on file      Review of Systems  All other systems reviewed and are negative.      Objective:   Physical Exam  Constitutional: He is oriented to person, place, and time. He appears well-developed and well-nourished.  HENT:  Left Ear: External ear normal.  Cardiovascular: Normal rate, regular rhythm and normal heart sounds.   Pulmonary/Chest: Effort normal and breath sounds normal.  Neurological: He is alert and oriented to person, place, and time. He has normal reflexes. He displays normal reflexes. No cranial nerve deficit. He exhibits normal muscle tone. Coordination normal.  Vitals reviewed.         Assessment & Plan:  BPPV (benign paroxysmal positional vertigo), unspecified laterality - Plan: COMPLETE METABOLIC PANEL WITH GFR, CBC with Differential/Platelet  Patient received the shingles shot today. His history is consistent with benign paroxysmal positional vertigo. His neurologic exam today is completely normal. I reassured the patient to give [place] tincture of time. This should spontaneously resolve over the next 2 weeks. Recheck if not better in 2 weeks or immediately if worsening. I will also check baseline lab work given the fact the patient has not had labs in  almost a year

## 2016-03-28 NOTE — Addendum Note (Signed)
Addended by: Shary Decamp B on: 03/28/2016 03:19 PM   Modules accepted: Orders

## 2016-03-29 LAB — COMPLETE METABOLIC PANEL WITH GFR
ALBUMIN: 4.1 g/dL (ref 3.6–5.1)
ALK PHOS: 66 U/L (ref 40–115)
ALT: 18 U/L (ref 9–46)
AST: 16 U/L (ref 10–35)
BILIRUBIN TOTAL: 0.5 mg/dL (ref 0.2–1.2)
BUN: 15 mg/dL (ref 7–25)
CO2: 21 mmol/L (ref 20–31)
Calcium: 9.1 mg/dL (ref 8.6–10.3)
Chloride: 103 mmol/L (ref 98–110)
Creat: 0.75 mg/dL (ref 0.70–1.25)
GFR, Est African American: >89 mL/min (ref >=60)
GFR, Est Non African American: >89 mL/min (ref >=60)
GLUCOSE: 89 mg/dL (ref 70–99)
Potassium: 3.7 mmol/L (ref 3.5–5.3)
SODIUM: 137 mmol/L (ref 135–146)
TOTAL PROTEIN: 6.6 g/dL (ref 6.1–8.1)

## 2016-03-29 LAB — CBC WITH DIFFERENTIAL/PLATELET
BASOS ABS: 0 {cells}/uL (ref 0–200)
Basophils Relative: 0 %
EOS ABS: 210 {cells}/uL (ref 15–500)
EOS PCT: 2 %
HCT: 44.3 % (ref 38.5–50.0)
HEMOGLOBIN: 15 g/dL (ref 13.0–17.0)
LYMPHS ABS: 2940 {cells}/uL (ref 850–3900)
Lymphocytes Relative: 28 %
MCH: 31 pg (ref 27.0–33.0)
MCHC: 33.9 g/dL (ref 32.0–36.0)
MCV: 91.5 fL (ref 80.0–100.0)
MONOS PCT: 7 %
MPV: 11.7 fL (ref 7.5–12.5)
Monocytes Absolute: 735 cells/uL (ref 200–950)
NEUTROS PCT: 63 %
Neutro Abs: 6615 cells/uL (ref 1500–7800)
Platelets: 268 10*3/uL (ref 140–400)
RBC: 4.84 MIL/uL (ref 4.20–5.80)
RDW: 13.8 % (ref 11.0–15.0)
WBC: 10.5 10*3/uL (ref 3.8–10.8)

## 2016-03-31 ENCOUNTER — Encounter: Payer: Self-pay | Admitting: *Deleted

## 2016-04-23 ENCOUNTER — Ambulatory Visit (HOSPITAL_COMMUNITY)
Admission: EM | Admit: 2016-04-23 | Discharge: 2016-04-23 | Disposition: A | Discharge status: Home / Self Care | Payer: Managed Care, Other (non HMO) | Attending: Family Medicine | Admitting: Family Medicine

## 2016-04-23 DIAGNOSIS — J441 Chronic obstructive pulmonary disease with (acute) exacerbation: Secondary | ICD-10-CM | POA: Diagnosis not present

## 2016-04-23 MED ORDER — PREDNISONE 20 MG PO TABS: ORAL_TABLET | ORAL | 0 refills | Status: DC

## 2016-04-23 MED ORDER — AZITHROMYCIN 250 MG PO TABS: 250.0000 mg | ORAL_TABLET | Freq: Every day | ORAL | 0 refills | Status: DC

## 2016-04-23 MED ORDER — BENZONATATE 100 MG PO CAPS: 100.0000 mg | ORAL_CAPSULE | Freq: Three times a day (TID) | ORAL | 0 refills | Status: DC

## 2016-04-23 MED ORDER — ALBUTEROL SULFATE HFA 108 (90 BASE) MCG/ACT IN AERS: 1.0000 | INHALATION_SPRAY | Freq: Four times a day (QID) | RESPIRATORY_TRACT | 0 refills | Status: DC | PRN

## 2016-04-23 NOTE — ED Triage Notes (Signed)
PT reports fever, cough, congestion, weakness that started Wednesday.

## 2016-04-23 NOTE — ED Provider Notes (Signed)
CSN: [id number]     Arrival date & time 04/23/16  1207 History   First MD Initiated Contact with Patient 04/23/16 1311     Chief Complaint  Patient presents with  . Influenza   (Consider location/radiation/quality/duration/timing/severity/associated sxs/prior Treatment) HPI James Branch is a 61 y.o. male presenting to UC with c/o 5-6 days of gradually worsening cough, congestion, body aches, generalized weakness and subjective fever.  He has taken OTC allegra with minimal relief. Denies n/v/d. Pt has a hx of COPD but notes he does not take any daily medications and only uses inhaler occasionally. He has not used his inhaler recently. Others have been sick around him. Pt concerned he has the flu.   Past Medical History:  Diagnosis Date  . COPD (chronic obstructive pulmonary disease) ([place])   . Diastolic dysfunction   . Hypertension   . Obesity   . OSA on CPAP    ahi-84, 18 CWP  . Smoker    Past Surgical History:  Procedure Laterality Date  . TONSILLECTOMY  1967   Family History  Problem Relation Age of Onset  . Hypertension Mother   . Hypertension Father    Social History  Substance Use Topics  . Smoking status: Former Smoker    Types: Cigarettes  . Smokeless tobacco: Not on file  . Alcohol use No    Review of Systems  Constitutional: Positive for fatigue and fever (subjective). Negative for chills.  HENT: Positive for congestion, postnasal drip and rhinorrhea. Negative for ear pain, sinus pain, sore throat, trouble swallowing and voice change.   Respiratory: Positive for cough and wheezing. Negative for shortness of breath.   Cardiovascular: Negative for chest pain and palpitations.  Gastrointestinal: Negative for abdominal pain, diarrhea, nausea and vomiting.  Musculoskeletal: Positive for arthralgias and myalgias. Negative for back pain.  Skin: Negative for rash.    Allergies  Penicillins  Home Medications   Prior to Admission medications   Medication Sig  Start Date End Date Taking? Authorizing Provider  albuterol (PROVENTIL HFA;VENTOLIN HFA) 108 (90 Base) MCG/ACT inhaler Inhale 1-2 puffs into the lungs every 6 (six) hours as needed for wheezing or shortness of breath. 04/23/16   Noland Fordyce, PA-C  azithromycin (ZITHROMAX) 250 MG tablet Take 1 tablet (250 mg total) by mouth daily. Take first 2 tablets together, then 1 every day until finished. 04/23/16   Noland Fordyce, PA-C  benzonatate (TESSALON) 100 MG capsule Take 1 capsule (100 mg total) by mouth every 8 (eight) hours. 04/23/16   Noland Fordyce, PA-C  esomeprazole (NEXIUM) 40 MG capsule TAKE ONE CAPSULE BY MOUTH EVERY DAY 08/31/14   [name], MD  hydrochlorothiazide (HYDRODIURIL) 25 MG tablet TAKE 1 TABLET BY MOUTH EVERY DAY 03/20/16   [name], MD  lisinopril (PRINIVIL,ZESTRIL) 20 MG tablet TAKE 1 TABLET BY MOUTH EVERY DAY 03/20/16   [name], MD  predniSONE (DELTASONE) 20 MG tablet 3 tabs po day one, then 2 po daily x 4 days 04/23/16   Noland Fordyce, PA-C  PROAIR HFA 108 (90 Base) MCG/ACT inhaler INHALE 2 PUFFS INTO THE LUNGS 4 (FOUR) TIMES DAILY. 09/21/15   [name], MD   Meds Ordered and Administered this Visit  Medications - No data to display  BP 137/58   Pulse 77   Temp 98.1 F (36.7 C) (Oral)   Resp 20   Ht 5\' 10"  (1.778 m)   Wt (!) 310 lb (140.6 kg)   SpO2 97%   BMI  44.48 kg/m  No data found.   Physical Exam  Constitutional: He is oriented to person, place, and time. He appears well-developed and well-nourished. No distress.  HENT:  Head: Normocephalic and atraumatic.  Right Ear: Tympanic membrane normal.  Left Ear: Tympanic membrane normal.  Nose: Mucosal edema present.  Mouth/Throat: Uvula is midline and mucous membranes are normal. Posterior oropharyngeal erythema present. No oropharyngeal exudate, posterior oropharyngeal edema or tonsillar abscesses.  Eyes: EOM are normal.  Neck: Normal range of motion. Neck supple.  Cardiovascular: Normal rate  and regular rhythm.   Pulmonary/Chest: Effort normal. No stridor. No respiratory distress. He has wheezes. He has rales.  Diffuse wheeze and coarse breath sounds w/o respiratory distress. Able to speak in full sentences w/o difficulty.   Musculoskeletal: Normal range of motion.  Lymphadenopathy:    He has no cervical adenopathy.  Neurological: He is alert and oriented to person, place, and time.  Skin: Skin is warm and dry. He is not diaphoretic.  Psychiatric: He has a normal mood and affect. His behavior is normal.  Nursing note and vitals reviewed.   Urgent Care Course   Clinical Course     Procedures (including critical care time)  Labs Review Labs Reviewed - No data to display  Imaging Review No results found.   MDM   1. COPD exacerbation (HCC)    Pt presenting to UC with 5 days of URI symptoms of cough, congestion, and body aches. Wheeze and coarse breath sounds noted on exam. O2 Sat 97% on RA.  Will treat for COPD exacerbation. Rx: azithromycin, prednisone, tessalon and albuterol Encouraged f/u with PCP in 1 week if not improving. Patient verbalized understanding and agreement with treatment plan.     Noland Fordyce, PA-C 04/23/16 1349

## 2016-05-12 ENCOUNTER — Encounter: Payer: Self-pay | Admitting: Family Medicine

## 2016-05-12 ENCOUNTER — Ambulatory Visit (INDEPENDENT_AMBULATORY_CARE_PROVIDER_SITE_OTHER): Payer: Managed Care, Other (non HMO) | Admitting: Family Medicine

## 2016-05-12 VITALS — BP 110/68 | HR 84 | Temp 98.1°F | Resp 22 | Ht 70.0 in | Wt 320.0 lb

## 2016-05-12 DIAGNOSIS — J208 Acute bronchitis due to other specified organisms: Secondary | ICD-10-CM | POA: Diagnosis not present

## 2016-05-12 MED ORDER — LEVOFLOXACIN 500 MG PO TABS: 500.0000 mg | ORAL_TABLET | Freq: Every day | ORAL | 0 refills | Status: DC

## 2016-05-12 MED ORDER — PREDNISONE 20 MG PO TABS: 40.0000 mg | ORAL_TABLET | Freq: Every day | ORAL | 0 refills | Status: DC

## 2016-05-12 NOTE — Progress Notes (Signed)
   Subjective:    Patient ID: James Branch, male    DOB: 17-Oct-1955, 61 y.o.   MRN: [id number]  HPI Symptoms began 2 or 3 weeks ago. Patient reports coughing, chest congestion, wheezing, shortness of breath, and now fever to 102. On examination today he has diminished breath sounds bilaterally with expiratory wheezing in the upper and lower lung fields bilaterally. There are no crackles Rales. He reports that yesterday evening he developed 102 fever. He reports shortness of breath he's been using albuterol every 4-6 hours. The fever is new. Past Medical History:  Diagnosis Date  . COPD (chronic obstructive pulmonary disease) ([place])   . Diastolic dysfunction   . Hypertension   . Obesity   . OSA on CPAP    ahi-84, 18 CWP  . Smoker    Past Surgical History:  Procedure Laterality Date  . TONSILLECTOMY  1967   Current Outpatient Prescriptions on File Prior to Visit  Medication Sig Dispense Refill  . albuterol (PROVENTIL HFA;VENTOLIN HFA) 108 (90 Base) MCG/ACT inhaler Inhale 1-2 puffs into the lungs every 6 (six) hours as needed for wheezing or shortness of breath. 1 Inhaler 0  . esomeprazole (NEXIUM) 40 MG capsule TAKE ONE CAPSULE BY MOUTH EVERY DAY 90 capsule 4  . hydrochlorothiazide (HYDRODIURIL) 25 MG tablet TAKE 1 TABLET BY MOUTH EVERY DAY 90 tablet 3  . lisinopril (PRINIVIL,ZESTRIL) 20 MG tablet TAKE 1 TABLET BY MOUTH EVERY DAY 90 tablet 3  . PROAIR HFA 108 (90 Base) MCG/ACT inhaler INHALE 2 PUFFS INTO THE LUNGS 4 (FOUR) TIMES DAILY. 8.5 Inhaler 11   No current facility-administered medications on file prior to visit.    Allergies  Allergen Reactions  . Penicillins Swelling   Social History   Social History  . Marital status: Single    Spouse name: N/A  . Number of children: N/A  . Years of education: N/A   Occupational History  . Not on file.   Social History Main Topics  . Smoking status: Former Smoker    Types: Cigarettes  . Smokeless tobacco: Never Used  .  Alcohol use No  . Drug use: No  . Sexual activity: Yes    Birth control/ protection: Condom   Other Topics Concern  . Not on file   Social History Narrative  . No narrative on file      Review of Systems  All other systems reviewed and are negative.      Objective:   Physical Exam  HENT:  Right Ear: External ear normal.  Left Ear: External ear normal.  Nose: Nose normal.  Mouth/Throat: Oropharynx is clear and moist. No oropharyngeal exudate.  Cardiovascular: Normal rate, regular rhythm and normal heart sounds.   Pulmonary/Chest: No accessory muscle usage. No respiratory distress. He has decreased breath sounds. He has wheezes.  Vitals reviewed.         Assessment & Plan:  Acute bronchitis due to other specified organisms - Plan: predniSONE (DELTASONE) 20 MG tablet, levofloxacin (LEVAQUIN) 500 MG tablet  I believe the patient has bronchitis. Is been going on now for 2 or 3 weeks. Now he's wheezing and also having any fever. Therefore I'll treat the patient with Levaquin 500 mg by mouth daily for 7 days in addition to prednisone 40 mg a day for 7 days. He can use albuterol 2 puffs inhaled every 4-6 hours as needed for wheezing. Recheck in one week if no better or sooner if worse

## 2016-06-27 ENCOUNTER — Encounter: Payer: Self-pay | Admitting: Family Medicine

## 2016-06-27 ENCOUNTER — Ambulatory Visit (INDEPENDENT_AMBULATORY_CARE_PROVIDER_SITE_OTHER): Payer: 59 | Admitting: Family Medicine

## 2016-06-27 VITALS — BP 128/70 | HR 82 | Temp 98.5°F | Resp 16 | Ht 70.0 in | Wt 318.0 lb

## 2016-06-27 DIAGNOSIS — R1030 Lower abdominal pain, unspecified: Secondary | ICD-10-CM

## 2016-06-27 LAB — URINALYSIS, MICROSCOPIC ONLY
Bacteria, UA: NONE SEEN [HPF]
CASTS: NONE SEEN [LPF]
Crystals: NONE SEEN [HPF]
RBC / HPF: NONE SEEN RBC/HPF (ref <=2)
YEAST: NONE SEEN [HPF]

## 2016-06-27 LAB — URINALYSIS, ROUTINE W REFLEX MICROSCOPIC
BILIRUBIN URINE: NEGATIVE
GLUCOSE, UA: NEGATIVE
HGB URINE DIPSTICK: NEGATIVE
KETONES UR: NEGATIVE
NITRITE: NEGATIVE
PH: 7 (ref 5.0–8.0)
Protein, ur: NEGATIVE
SPECIFIC GRAVITY, URINE: 1.015 (ref 1.001–1.035)

## 2016-06-27 NOTE — Progress Notes (Signed)
Subjective:    Patient ID: James Branch, male    DOB: Feb 23, 1956, 61 y.o.   MRN: [id number]  HPI Symptoms began 4 days ago.  Patient reports a pressure all the way across his lower abdomen from the left lower quadrant to the right lower quadrant. It comes and goes. It is relieved by urinating. Is also relieved by defecating. He denies any dysuria. He denies any hematuria. He denies any blood in the stool. He denies any melena. He denies any fever. He denies any nausea or vomiting. Food does not exacerbate the pain. The pain is described as a pressure-like sensation. He believes he may become more constipated recently. This morning he had a good bowel movement and the pain improved. His abdomen is nondistended. There is no tenderness to palpation. He does have sluggish bowel sounds. He has a stable umbilical hernia Past Medical History:  Diagnosis Date  . COPD (chronic obstructive pulmonary disease) ([place])   . Diastolic dysfunction   . Hypertension   . Obesity   . OSA on CPAP    ahi-84, 18 CWP  . Smoker    Past Surgical History:  Procedure Laterality Date  . TONSILLECTOMY  1967   Current Outpatient Prescriptions on File Prior to Visit  Medication Sig Dispense Refill  . hydrochlorothiazide (HYDRODIURIL) 25 MG tablet TAKE 1 TABLET BY MOUTH EVERY DAY 90 tablet 3  . lisinopril (PRINIVIL,ZESTRIL) 20 MG tablet TAKE 1 TABLET BY MOUTH EVERY DAY 90 tablet 3  . PROAIR HFA 108 (90 Base) MCG/ACT inhaler INHALE 2 PUFFS INTO THE LUNGS 4 (FOUR) TIMES DAILY. 8.5 Inhaler 11   No current facility-administered medications on file prior to visit.    Allergies  Allergen Reactions  . Penicillins Swelling   Social History   Social History  . Marital status: Single    Spouse name: N/A  . Number of children: N/A  . Years of education: N/A   Occupational History  . Not on file.   Social History Main Topics  . Smoking status: Former Smoker    Types: Cigarettes  . Smokeless tobacco: Never Used    . Alcohol use No  . Drug use: No  . Sexual activity: Yes    Birth control/ protection: Condom   Other Topics Concern  . Not on file   Social History Narrative  . No narrative on file      Review of Systems  All other systems reviewed and are negative.      Objective:   Physical Exam  HENT:  Right Ear: External ear normal.  Left Ear: External ear normal.  Nose: Nose normal.  Mouth/Throat: Oropharynx is clear and moist. No oropharyngeal exudate.  Cardiovascular: Normal rate, regular rhythm and normal heart sounds.   Pulmonary/Chest: No accessory muscle usage. No respiratory distress. He has no decreased breath sounds. He has no wheezes.  Abdominal: Bowel sounds are decreased. There is no tenderness. There is no rigidity, no rebound and no guarding. Hernia confirmed negative in the right inguinal area and confirmed negative in the left inguinal area.  Genitourinary: Rectum normal, testes normal and penis normal. Rectal exam shows no external hemorrhoid and no fissure. Prostate is not enlarged and not tender.  Lymphadenopathy:       Right: No inguinal adenopathy present.       Left: No inguinal adenopathy present.  Vitals reviewed.         Assessment & Plan:  Lower abdominal pain I suspect constipation. Prostate exam is  normal making prostatitis unlikely. I will check a urinalysis to rule out urinary tract infection. If normal, I'll treat the patient for constipation with Linzess 145 mcg poqday (samples) until feeling better.  Return if changing or worsening

## 2016-06-27 NOTE — Addendum Note (Signed)
Addended by: Shary Decamp B on: 06/27/2016 12:17 PM   Modules accepted: Orders

## 2016-06-29 LAB — URINE CULTURE

## 2016-10-16 ENCOUNTER — Telehealth: Payer: Self-pay | Admitting: Family Medicine

## 2016-10-16 MED ORDER — ALBUTEROL SULFATE HFA 108 (90 BASE) MCG/ACT IN AERS: INHALATION_SPRAY | RESPIRATORY_TRACT | 11 refills | Status: DC

## 2016-10-16 NOTE — Telephone Encounter (Signed)
Pt needs proair inhaler called into [facility] [place]

## 2016-10-16 NOTE — Telephone Encounter (Signed)
Medication called/sent to requested pharmacy  

## 2016-12-11 ENCOUNTER — Encounter: Payer: Self-pay | Admitting: Family Medicine

## 2016-12-11 ENCOUNTER — Ambulatory Visit (INDEPENDENT_AMBULATORY_CARE_PROVIDER_SITE_OTHER): Payer: 59 | Admitting: Family Medicine

## 2016-12-11 VITALS — BP 132/60 | HR 78 | Temp 98.2°F | Resp 20 | Ht 70.0 in | Wt 325.0 lb

## 2016-12-11 DIAGNOSIS — H8112 Benign paroxysmal vertigo, left ear: Secondary | ICD-10-CM | POA: Diagnosis not present

## 2016-12-11 DIAGNOSIS — H903 Sensorineural hearing loss, bilateral: Secondary | ICD-10-CM

## 2016-12-11 NOTE — Progress Notes (Signed)
Subjective:    Patient ID: James Branch, male    DOB: [birth year], 61 y.o.   MRN: [id number]  HPI  Patient has a history of BPPV. Was diagnosed last November. Symptoms persisted until January and then resolve. Patient had been asymptomatic for almost 8 months. While working over the weekend, he turned his head left to look underneath a car and inspect a ball joint when suddenly his surrounded starting to spend any developed severe acute onset of vertigo. Symptoms lasted approximately 1 minute and resolve spontaneously. Over the weekend he had 2 other episodes were vertigo occurred when he tried to lay down to go to sleep. He denies any head injury. He denies any blurry vision. He denies any sudden hearing loss or ear pain. Past Medical History:  Diagnosis Date  . COPD (chronic obstructive pulmonary disease) ([place])   . Diastolic dysfunction   . Hypertension   . Obesity   . OSA on CPAP    ahi-84, 18 CWP  . Smoker    Past Surgical History:  Procedure Laterality Date  . TONSILLECTOMY  1967   Current Outpatient Prescriptions on File Prior to Visit  Medication Sig Dispense Refill  . albuterol (PROAIR HFA) 108 (90 Base) MCG/ACT inhaler INHALE 2 PUFFS INTO THE LUNGS 4 (FOUR) TIMES DAILY. 8.5 Inhaler 11  . hydrochlorothiazide (HYDRODIURIL) 25 MG tablet TAKE 1 TABLET BY MOUTH EVERY DAY 90 tablet 3  . lisinopril (PRINIVIL,ZESTRIL) 20 MG tablet TAKE 1 TABLET BY MOUTH EVERY DAY 90 tablet 3   No current facility-administered medications on file prior to visit.    Allergies  Allergen Reactions  . Penicillins Swelling   Social History   Social History  . Marital status: Single    Spouse name: N/A  . Number of children: N/A  . Years of education: N/A   Occupational History  . Not on file.   Social History Main Topics  . Smoking status: Former Smoker    Types: Cigarettes  . Smokeless tobacco: Never Used  . Alcohol use No  . Drug use: No  . Sexual activity: Yes    Birth control/  protection: Condom   Other Topics Concern  . Not on file   Social History Narrative  . No narrative on file     Review of Systems  All other systems reviewed and are negative.      Objective:   Physical Exam  Constitutional: He is oriented to person, place, and time.  HENT:  Right Ear: External ear normal.  Left Ear: External ear normal.  Nose: Nose normal.  Mouth/Throat: Oropharynx is clear and moist. No oropharyngeal exudate.  Cardiovascular: Normal rate, regular rhythm and normal heart sounds.   Pulmonary/Chest: Effort normal and breath sounds normal. No respiratory distress. He has no wheezes. He has no rales.  Neurological: He is alert and oriented to person, place, and time. He has normal reflexes. He displays normal reflexes. No cranial nerve deficit. He exhibits normal muscle tone. Coordination normal.  Vitals reviewed.         Assessment & Plan:  Benign paroxysmal positional vertigo of left ear  Sensorineural hearing loss (SNHL) of both ears  Patient has BPPV. I explained the natural history and cause of this to the patient. I gave him a handout on Epley exercises performing home. Symptoms should gradually reduce in intensity and frequency over the next 2 weeks and then resolve spontaneously. If symptoms change or worsen he is to let me know immediately. He  also has chronic hearing loss in both ears. He is having a difficult time at work and believes it is now time for him to get hearing aids. He would like a referral to audiology for evaluations for hearing aid

## 2016-12-30 ENCOUNTER — Ambulatory Visit (HOSPITAL_COMMUNITY)
Admission: EM | Admit: 2016-12-30 | Discharge: 2016-12-30 | Disposition: A | Discharge status: Home / Self Care | Payer: 59 | Attending: Family Medicine | Admitting: Family Medicine

## 2016-12-30 ENCOUNTER — Encounter (HOSPITAL_COMMUNITY): Payer: Self-pay | Admitting: Family Medicine

## 2016-12-30 DIAGNOSIS — J441 Chronic obstructive pulmonary disease with (acute) exacerbation: Secondary | ICD-10-CM | POA: Diagnosis not present

## 2016-12-30 MED ORDER — AZITHROMYCIN 250 MG PO TABS: 250.0000 mg | ORAL_TABLET | Freq: Every day | ORAL | 0 refills | Status: DC

## 2016-12-30 MED ORDER — PREDNISONE 20 MG PO TABS: ORAL_TABLET | ORAL | 0 refills | Status: DC

## 2016-12-30 MED ORDER — IPRATROPIUM BROMIDE 0.03 % NA SOLN: 2.0000 | Freq: Two times a day (BID) | NASAL | 0 refills | Status: DC

## 2016-12-30 NOTE — Discharge Instructions (Signed)
Please return or see your doctor if symptoms are not improving in the next 48 hours.

## 2016-12-30 NOTE — ED Provider Notes (Signed)
[place]   [id number] 12/30/16 Arrival Time: 1212   SUBJECTIVE:  James Branch is a 61 y.o. male who presents to the urgent care with complaint of nasal congestion and  Cough for three days.  Patient has a h/o COPD and OSA.  He continues to smoke Hypertension is another ongoing issue along with diastolic dysfunction.  He is a Freight forwarder at [employer]  No fever Wife has sinus infection as well.     Past Medical History:  Diagnosis Date  . COPD (chronic obstructive pulmonary disease) ([place])   . Diastolic dysfunction   . Hypertension   . Obesity   . OSA on CPAP    ahi-84, 18 CWP  . Smoker    Family History  Problem Relation Age of Onset  . Hypertension Mother   . Hypertension Father    Social History   Social History  . Marital status: Single    Spouse name: N/A  . Number of children: N/A  . Years of education: N/A   Occupational History  . Not on file.   Social History Main Topics  . Smoking status: Former Smoker    Types: Cigarettes  . Smokeless tobacco: Never Used  . Alcohol use No  . Drug use: No  . Sexual activity: Yes    Birth control/ protection: Condom   Other Topics Concern  . Not on file   Social History Narrative  . No narrative on file   Current Meds  Medication Sig  . albuterol (PROAIR HFA) 108 (90 Base) MCG/ACT inhaler INHALE 2 PUFFS INTO THE LUNGS 4 (FOUR) TIMES DAILY.  . hydrochlorothiazide (HYDRODIURIL) 25 MG tablet TAKE 1 TABLET BY MOUTH EVERY DAY  . lisinopril (PRINIVIL,ZESTRIL) 20 MG tablet TAKE 1 TABLET BY MOUTH EVERY DAY   Allergies  Allergen Reactions  . Penicillins Swelling      ROS: As per HPI, remainder of ROS negative.   OBJECTIVE:   Vitals:   12/30/16 1257  BP: (!) 122/48  Pulse: 70  Resp: 18  Temp: 98.4 F (36.9 C)  TempSrc: Oral  SpO2: 97%     General appearance: alert; no distress Eyes: PERRL; right exophoria; conjunctiva normal HENT: normocephalic; atraumatic; TMs normal, canal normal,  external ears normal without trauma; nasal mucosa normal; oral mucosa normal.  Hard of hearing Neck: supple Lungs: wheezing Heart: regular rate and rhythm Abdomen: soft, non-tender; bowel sounds normal; no masses or organomegaly; no guarding or rebound tenderness Back: no CVA tenderness Extremities: no cyanosis or edema; symmetrical with no gross deformities Skin: warm and dry Neurologic: normal gait; grossly normal Psychological: alert and cooperative; normal mood and affect      Labs:  Results for orders placed or performed in visit on 06/27/16  Urine culture  Result Value Ref Range   Organism ID, Bacteria      Multiple organisms present,each less than 10,000 CFU/mL. These organisms,commonly found on external and internal genitalia,are considered colonizers. No further testing performed.   Urinalysis, Routine w reflex microscopic  Result Value Ref Range   Color, Urine YELLOW YELLOW   APPearance CLEAR CLEAR   Specific Gravity, Urine 1.015 1.001 - 1.035   pH 7.0 5.0 - 8.0   Glucose, UA NEGATIVE NEGATIVE   Bilirubin Urine NEGATIVE NEGATIVE   Ketones, ur NEGATIVE NEGATIVE   Hgb urine dipstick NEGATIVE NEGATIVE   Protein, ur NEGATIVE NEGATIVE   Nitrite NEGATIVE NEGATIVE   Leukocytes, UA 1+ (A) NEGATIVE  Urine Microscopic  Result Value Ref Range  WBC, UA 0-5 <=5 WBC/HPF   RBC / HPF NONE SEEN <=2 RBC/HPF   Squamous Epithelial / LPF 0-5 <=5 HPF   Bacteria, UA NONE SEEN NONE SEEN HPF   Crystals NONE SEEN NONE SEEN HPF   Casts NONE SEEN NONE SEEN LPF   Yeast NONE SEEN NONE SEEN HPF    Labs Reviewed - No data to display  No results found.     ASSESSMENT & PLAN:  1. COPD exacerbation ([place])     Meds ordered this encounter  Medications  . ipratropium (ATROVENT) 0.03 % nasal spray    Sig: Place 2 sprays into both nostrils 2 (two) times daily.    Dispense:  30 mL    Refill:  0  . predniSONE (DELTASONE) 20 MG tablet    Sig: Two daily with food    Dispense:  10  tablet    Refill:  0  . azithromycin (ZITHROMAX) 250 MG tablet    Sig: Take 1 tablet (250 mg total) by mouth daily. Take first 2 tablets together, then 1 every day until finished.    Dispense:  6 tablet    Refill:  0    Reviewed expectations re: course of current medical issues. Questions answered. Outlined signs and symptoms indicating need for more acute intervention. Patient verbalized understanding. After Visit Summary given.    Procedures:      Robyn Haber, MD 12/30/16 1309

## 2016-12-30 NOTE — ED Triage Notes (Signed)
Pts presents today with nasal congestion and cough x3 days. Has tried sudafed OTC with no relief.

## 2017-01-15 DIAGNOSIS — H903 Sensorineural hearing loss, bilateral: Secondary | ICD-10-CM | POA: Insufficient documentation

## 2017-03-16 ENCOUNTER — Other Ambulatory Visit: Payer: Self-pay | Admitting: Family Medicine

## 2017-03-16 NOTE — Telephone Encounter (Signed)
Medication refill for one time only.  Patient needs to be seen.  Letter sent for patient to call and schedule 

## 2017-04-30 ENCOUNTER — Encounter (HOSPITAL_COMMUNITY): Payer: Self-pay | Admitting: Emergency Medicine

## 2017-04-30 ENCOUNTER — Ambulatory Visit (HOSPITAL_COMMUNITY)
Admission: EM | Admit: 2017-04-30 | Discharge: 2017-04-30 | Disposition: A | Discharge status: Home / Self Care | Payer: 59 | Attending: Internal Medicine | Admitting: Internal Medicine

## 2017-04-30 ENCOUNTER — Other Ambulatory Visit: Payer: Self-pay

## 2017-04-30 DIAGNOSIS — S46312A Strain of muscle, fascia and tendon of triceps, left arm, initial encounter: Secondary | ICD-10-CM | POA: Diagnosis not present

## 2017-04-30 DIAGNOSIS — G5632 Lesion of radial nerve, left upper limb: Secondary | ICD-10-CM | POA: Diagnosis not present

## 2017-04-30 NOTE — ED Provider Notes (Signed)
[facility]    CSN: [id number] Arrival date & time: 04/30/17  1006     History   Chief Complaint Chief Complaint  Patient presents with  . Arm Pain    HPI KHAYMAN KIRSCH is a 62 y.o. male.   62 yo male w/ OSA and diastolic dysfunction c/o left upper arm pain in any position that radiates into forearm and posterior hand; left grip strength is subjectively decreased and hand and 4th/5th fingers ache sometimes along dorsum of hand. Denies difficulty swallowing, speaking or seeing. Also denies CP, SOB, dizziness, weakness or sensory abnormalities elsewhere.       Past Medical History:  Diagnosis Date  . COPD (chronic obstructive pulmonary disease) ([place])   . Diastolic dysfunction   . Hypertension   . Obesity   . OSA on CPAP    ahi-84, 18 CWP  . Smoker     Patient Active Problem List   Diagnosis Date Noted  . Obesity   . Smoker   . OSA on CPAP   . Diastolic dysfunction     Past Surgical History:  Procedure Laterality Date  . TONSILLECTOMY  1967       Home Medications    Prior to Admission medications   Medication Sig Start Date End Date Taking? Authorizing Provider  albuterol (PROAIR HFA) 108 (90 Base) MCG/ACT inhaler INHALE 2 PUFFS INTO THE LUNGS 4 (FOUR) TIMES DAILY. 10/16/16   [name], MD  azithromycin (ZITHROMAX) 250 MG tablet Take 1 tablet (250 mg total) by mouth daily. Take first 2 tablets together, then 1 every day until finished. Patient not taking: Reported on 04/30/2017 12/30/16   Robyn Haber, MD  hydrochlorothiazide (HYDRODIURIL) 25 MG tablet TAKE 1 TABLET BY MOUTH EVERY DAY 03/16/17   [name], MD  ipratropium (ATROVENT) 0.03 % nasal spray Place 2 sprays into both nostrils 2 (two) times daily. 12/30/16   Robyn Haber, MD  lisinopril (PRINIVIL,ZESTRIL) 20 MG tablet TAKE 1 TABLET BY MOUTH EVERY DAY 03/16/17   [name], MD  predniSONE (DELTASONE) 20 MG tablet Two daily with food Patient not taking: Reported  on 04/30/2017 12/30/16   Robyn Haber, MD    Family History Family History  Problem Relation Age of Onset  . Hypertension Mother   . Hypertension Father     Social History Social History   Tobacco Use  . Smoking status: Former Smoker    Types: Cigarettes  . Smokeless tobacco: Never Used  Substance Use Topics  . Alcohol use: No    Alcohol/week: 0.0 oz  . Drug use: No     Allergies   Penicillins   Review of Systems Review of Systems  Constitutional: Negative for chills and fever.  HENT: Negative for sore throat and tinnitus.   Eyes: Negative for redness.  Respiratory: Negative for cough and shortness of breath.   Cardiovascular: Negative for chest pain and palpitations.  Gastrointestinal: Negative for abdominal pain, diarrhea, nausea and vomiting.  Genitourinary: Negative for dysuria, frequency and urgency.  Musculoskeletal: Negative for myalgias.  Skin: Negative for rash.       No lesions  Neurological: Negative for weakness.  Hematological: Does not bruise/bleed easily.  Psychiatric/Behavioral: Negative for suicidal ideas.     Physical Exam Triage Vital Signs ED Triage Vitals  Enc Vitals Group     BP 04/30/17 1038 122/61     Pulse Rate 04/30/17 1038 93     Resp 04/30/17 1038 14     Temp  04/30/17 1038 98.2 F (36.8 C)     Temp src --      SpO2 04/30/17 1038 96 %     Weight --      Height --      Head Circumference --      Peak Flow --      Pain Score 04/30/17 1040 3     Pain Loc --      Pain Edu? --      Excl. in [place]? --    No data found.  Updated Vital Signs BP 122/61   Pulse 93   Temp 98.2 F (36.8 C)   Resp 14   SpO2 96%   Visual Acuity Right Eye Distance:   Left Eye Distance:   Bilateral Distance:    Right Eye Near:   Left Eye Near:    Bilateral Near:     Physical Exam  Constitutional: He is oriented to person, place, and time. He appears well-developed and well-nourished. No distress.  HENT:  Head: Normocephalic and  atraumatic.  Mouth/Throat: Oropharynx is clear and moist.  Eyes: Conjunctivae and EOM are normal. Pupils are equal, round, and reactive to light. No scleral icterus.  Neck: Normal range of motion. Neck supple. No JVD present. No tracheal deviation present. No thyromegaly present.  Cardiovascular: Normal rate, regular rhythm and normal heart sounds. Exam reveals no gallop and no friction rub.  No murmur heard. Pulmonary/Chest: Effort normal and breath sounds normal. No respiratory distress.  Abdominal: Soft. Bowel sounds are normal. He exhibits no distension. There is no tenderness.  Musculoskeletal: Normal range of motion. He exhibits no edema.       Arms: Lymphadenopathy:    He has no cervical adenopathy.  Neurological: He is alert and oriented to person, place, and time. No cranial nerve deficit.  Skin: Skin is warm and dry. No rash noted. No erythema.  Psychiatric: He has a normal mood and affect. His behavior is normal. Judgment and thought content normal.     UC Treatments / Results  Labs (all labs ordered are listed, but only abnormal results are displayed) Labs Reviewed - No data to display  EKG  EKG Interpretation None       Radiology No results found.  Procedures Procedures (including critical care time)  Medications Ordered in UC Medications - No data to display   Initial Impression / Assessment and Plan / UC Course  I have reviewed the triage vital signs and the nursing notes.  Pertinent labs & imaging results that were available during my care of the patient were reviewed by me and considered in my medical decision making (see chart for details).     Nerve compression syndrome secondary to muscle strain. NSAID therapy and compression brace. Examples for purchase handed to patient.   Final Clinical Impressions(s) / UC Diagnoses   Final diagnoses:  Radial nerve compression, left  Triceps strain, left, initial encounter    ED Discharge Orders    None         Controlled Substance Prescriptions [place] Controlled Substance Registry consulted? Not Applicable   Harrie Foreman, MD 04/30/17 1105

## 2017-04-30 NOTE — ED Triage Notes (Signed)
Pt states about two weeks ago his left arm started aching, denies injury. States when he leans his arm against something it hurts worse. Pain worse with lying on it.

## 2017-05-08 ENCOUNTER — Ambulatory Visit: Payer: 59 | Admitting: Family Medicine

## 2017-05-08 ENCOUNTER — Encounter: Payer: Self-pay | Admitting: Family Medicine

## 2017-05-08 VITALS — BP 132/74 | HR 80 | Temp 97.9°F | Resp 20 | Ht 70.0 in | Wt 328.0 lb

## 2017-05-08 DIAGNOSIS — M25522 Pain in left elbow: Secondary | ICD-10-CM

## 2017-05-08 MED ORDER — PREDNISONE 20 MG PO TABS: ORAL_TABLET | ORAL | 0 refills | Status: DC

## 2017-05-08 NOTE — Progress Notes (Signed)
Subjective:    Patient ID: James Branch, male    DOB: [birth year], 62 y.o.   MRN: [id number]  HPI Patient presents with one-week of left arm pain. It is primarily located at the left lateral epicondyles and radiates over the dorsal surface of the extensor muscle group of the forearm. Location would be consistent with lateral epicondylitis however he only has minimal tenderness to palpation in that area. He also complains of some vague pain in his left triceps as well as some vague pain in his distal left forearm. There is no exacerbating or alleviating factors. It occurs worse at night when he tries to sleep. Nothing he does makes the pain better. He denies any neck pain or numbness or tingling in his left arm. He has full range of motion in his left shoulder without pain. There is no evidence of rotator cuff pathology. He has full flexion and extension of the left elbow and left wrist without pain. He has normal grip strength. The left arm is neurovascularly intact with normal reflexes Past Medical History:  Diagnosis Date  . COPD (chronic obstructive pulmonary disease) ([place])   . Diastolic dysfunction   . Hypertension   . Obesity   . OSA on CPAP    ahi-84, 18 CWP  . Smoker    Past Surgical History:  Procedure Laterality Date  . TONSILLECTOMY  1967   Current Outpatient Medications on File Prior to Visit  Medication Sig Dispense Refill  . albuterol (PROAIR HFA) 108 (90 Base) MCG/ACT inhaler INHALE 2 PUFFS INTO THE LUNGS 4 (FOUR) TIMES DAILY. 8.5 Inhaler 11  . hydrochlorothiazide (HYDRODIURIL) 25 MG tablet TAKE 1 TABLET BY MOUTH EVERY DAY 90 tablet 0  . ipratropium (ATROVENT) 0.03 % nasal spray Place 2 sprays into both nostrils 2 (two) times daily. 30 mL 0  . lisinopril (PRINIVIL,ZESTRIL) 20 MG tablet TAKE 1 TABLET BY MOUTH EVERY DAY 90 tablet 0  . predniSONE (DELTASONE) 20 MG tablet Two daily with food (Patient not taking: Reported on 04/30/2017) 10 tablet 0   No current  facility-administered medications on file prior to visit.    Allergies  Allergen Reactions  . Penicillins Swelling   Social History   Socioeconomic History  . Marital status: Single    Spouse name: Not on file  . Number of children: Not on file  . Years of education: Not on file  . Highest education level: Not on file  Social Needs  . Financial resource strain: Not on file  . Food insecurity - worry: Not on file  . Food insecurity - inability: Not on file  . Transportation needs - medical: Not on file  . Transportation needs - non-medical: Not on file  Occupational History  . Not on file  Tobacco Use  . Smoking status: Former Smoker    Types: Cigarettes  . Smokeless tobacco: Never Used  Substance and Sexual Activity  . Alcohol use: No    Alcohol/week: 0.0 oz  . Drug use: No  . Sexual activity: Yes    Birth control/protection: Condom  Other Topics Concern  . Not on file  Social History Narrative  . Not on file      Review of Systems  All other systems reviewed and are negative.      Objective:   Physical Exam  Cardiovascular: Normal rate, regular rhythm and normal heart sounds.  Pulmonary/Chest: Effort normal and breath sounds normal. No respiratory distress. He has no wheezes. He has no rales.  Musculoskeletal:       Left shoulder: Normal.       Left elbow: He exhibits normal range of motion, no swelling, no effusion and no deformity. Tenderness found. Lateral epicondyle tenderness noted.       Left wrist: Normal.       Left upper arm: Normal. He exhibits no tenderness, no bony tenderness, no swelling, no edema and no deformity.       Left forearm: He exhibits tenderness. He exhibits no bony tenderness, no swelling, no edema, no deformity and no laceration.  Vitals reviewed.         Assessment & Plan:  Left elbow pain - Plan: DG Elbow Complete Left  Diagnosis is uncertain but my leading suspicion is lateral epicondylitis. I recommended a cortisone  injection at the lateral upper condyle the patient would like to try prednisone taper pack first. Given the onset and nature of the diagnosis, I also recommended an x-ray of left elbow to rule out underlying skeletal pathology. Also the differential diagnosis would include cervical radiculopathy

## 2017-05-09 ENCOUNTER — Ambulatory Visit
Admission: RE | Admit: 2017-05-09 | Discharge: 2017-05-09 | Disposition: A | Discharge status: Home / Self Care | Payer: 59 | Source: Ambulatory Visit | Attending: Family Medicine | Admitting: Family Medicine

## 2017-05-09 DIAGNOSIS — M25522 Pain in left elbow: Secondary | ICD-10-CM

## 2017-05-14 ENCOUNTER — Ambulatory Visit: Payer: 59 | Admitting: Family Medicine

## 2017-05-22 ENCOUNTER — Telehealth: Payer: Self-pay | Admitting: Family Medicine

## 2017-05-22 NOTE — Telephone Encounter (Signed)
I recommended he return for cortisone shot for lateral epicondylitis

## 2017-05-22 NOTE — Telephone Encounter (Signed)
Pt's wife called [facility] stating that his arm pain is no better and wanted to know what to do now??

## 2017-05-22 NOTE — Telephone Encounter (Signed)
Pt's wife aware and will call back to set up apt when she knows his work schedule

## 2017-05-29 ENCOUNTER — Ambulatory Visit: Payer: 59 | Admitting: Family Medicine

## 2017-05-29 ENCOUNTER — Encounter: Payer: Self-pay | Admitting: Family Medicine

## 2017-05-29 VITALS — BP 110/68 | HR 72 | Temp 97.7°F | Resp 18 | Ht 70.0 in | Wt 316.0 lb

## 2017-05-29 DIAGNOSIS — M5412 Radiculopathy, cervical region: Secondary | ICD-10-CM

## 2017-05-29 MED ORDER — GABAPENTIN 300 MG PO CAPS: 300.0000 mg | ORAL_CAPSULE | Freq: Three times a day (TID) | ORAL | 3 refills | Status: DC | PRN

## 2017-05-29 MED ORDER — MELOXICAM 15 MG PO TABS: 15.0000 mg | ORAL_TABLET | Freq: Every day | ORAL | 0 refills | Status: DC

## 2017-05-29 NOTE — Progress Notes (Signed)
Subjective:    Patient ID: James Branch, male    DOB: Mar 27, 1956, 62 y.o.   MRN: [id number]  HPI  05/08/17 Patient presents with one-week of left arm pain. It is primarily located at the left lateral epicondyles and radiates over the dorsal surface of the extensor muscle group of the forearm. Location would be consistent with lateral epicondylitis however he only has minimal tenderness to palpation in that area. He also complains of some vague pain in his left triceps as well as some vague pain in his distal left forearm. There is no exacerbating or alleviating factors. It occurs worse at night when he tries to sleep. Nothing he does makes the pain better. He denies any neck pain or numbness or tingling in his left arm. He has full range of motion in his left shoulder without pain. There is no evidence of rotator cuff pathology. He has full flexion and extension of the left elbow and left wrist without pain. He has normal grip strength. The left arm is neurovascularly intact with normal reflexes.  At that time, my plan was:  Diagnosis is uncertain but my leading suspicion is lateral epicondylitis. I recommended a cortisone injection at the lateral upper condyle the patient would like to try prednisone taper pack first. Given the onset and nature of the diagnosis, I also recommended an x-ray of left elbow to rule out underlying skeletal pathology. Also the differential diagnosis would include cervical radiculopathy  05/29/17 Patient saw no benefit from prednisone.  He continues to have pain in his left arm.  However the pattern of the pain has changed since his last visit.  He states that during the day, his pain is relatively well controlled.  However when he sleeps at night, he will usually awaken after 1-2 hours with deep aching pain in the lateral aspect of his left shoulder radiating down into his tricep.  At times it will radiate across the left elbow onto the dorsal surface of the left forearm.   At other times he will feel numbness and tingling in his left hand.  However he denies any neck pain.  He denies any pain with range of motion in his neck.  He denies any pain with range of motion in the left shoulder.  He has full abduction without pain.  He has a negative empty can sign.  He has a negative Hawking sign.  Although he feels pain radiate across the dorsal surface of the left forearm around the lateral epicondyle, he is no longer tender in this area.  He has normal range of motion at the elbow without pain.  He has a negative Tinel sign.  He has a negative Phalen sign.  His muscle strength is 5/5 equal and symmetric in the upper and lower extremities.  There is normal grip strength.  He denies any numbness in his hand.  He has a negative Spurling sign.  He has normal reflexes. Past Medical History:  Diagnosis Date  . COPD (chronic obstructive pulmonary disease) ([place])   . Diastolic dysfunction   . Hypertension   . Obesity   . OSA on CPAP    ahi-84, 18 CWP  . Smoker    Past Surgical History:  Procedure Laterality Date  . TONSILLECTOMY  1967   Current Outpatient Medications on File Prior to Visit  Medication Sig Dispense Refill  . albuterol (PROAIR HFA) 108 (90 Base) MCG/ACT inhaler INHALE 2 PUFFS INTO THE LUNGS 4 (FOUR) TIMES DAILY. 8.5 Inhaler  11  . hydrochlorothiazide (HYDRODIURIL) 25 MG tablet TAKE 1 TABLET BY MOUTH EVERY DAY 90 tablet 0  . ipratropium (ATROVENT) 0.03 % nasal spray Place 2 sprays into both nostrils 2 (two) times daily. 30 mL 0  . lisinopril (PRINIVIL,ZESTRIL) 20 MG tablet TAKE 1 TABLET BY MOUTH EVERY DAY 90 tablet 0   No current facility-administered medications on file prior to visit.    Allergies  Allergen Reactions  . Penicillins Swelling   Social History   Socioeconomic History  . Marital status: Single    Spouse name: Not on file  . Number of children: Not on file  . Years of education: Not on file  . Highest education level: Not on file    Social Needs  . Financial resource strain: Not on file  . Food insecurity - worry: Not on file  . Food insecurity - inability: Not on file  . Transportation needs - medical: Not on file  . Transportation needs - non-medical: Not on file  Occupational History  . Not on file  Tobacco Use  . Smoking status: Former Smoker    Types: Cigarettes  . Smokeless tobacco: Never Used  Substance and Sexual Activity  . Alcohol use: No    Alcohol/week: 0.0 oz  . Drug use: No  . Sexual activity: Yes    Birth control/protection: Condom  Other Topics Concern  . Not on file  Social History Narrative  . Not on file      Review of Systems  All other systems reviewed and are negative.      Objective:   Physical Exam  Cardiovascular: Normal rate, regular rhythm and normal heart sounds.  Pulmonary/Chest: Effort normal and breath sounds normal. No respiratory distress. He has no wheezes. He has no rales.  Musculoskeletal:       Left shoulder: Normal.       Left elbow: He exhibits normal range of motion, no swelling, no effusion and no deformity. No tenderness found.       Left wrist: Normal.       Back:       Left upper arm: Normal. He exhibits no tenderness, no bony tenderness, no swelling, no edema and no deformity.       Left forearm: He exhibits no tenderness, no bony tenderness, no swelling, no edema, no deformity and no laceration.  Vitals reviewed.         Assessment & Plan:  Cervical radiculopathy - Plan: gabapentin (NEURONTIN) 300 MG capsule, meloxicam (MOBIC) 15 MG tablet, DG Cervical Spine Complete, DG Shoulder Left Patient's history and physical are nonspecific however the pain follows the path of the red arrow shown in the diagram.  Therefore I believe he has cervical radiculopathy.  Begin gabapentin 300 mg p.o. every 8 hours as needed.  I advised the patient that it can make him dizzy and sleepy.  I advised him to primarily take it at night before he goes to bed to see if it  will help with neuropathic pain.  Meanwhile I will start him on an anti-inflammatory, meloxicam, 15 mg p.o. daily for inflammation.  I would like to obtain x-rays of the neck as well as the left shoulder to evaluate for underlying skeletal pathology.  If pain persists, consider EMG/nerve conduction studies versus an MRI of the cervical spine.

## 2017-05-30 ENCOUNTER — Ambulatory Visit
Admission: RE | Admit: 2017-05-30 | Discharge: 2017-05-30 | Disposition: A | Discharge status: Home / Self Care | Payer: 59 | Source: Ambulatory Visit | Attending: Family Medicine | Admitting: Family Medicine

## 2017-05-30 DIAGNOSIS — M5412 Radiculopathy, cervical region: Secondary | ICD-10-CM

## 2017-06-25 ENCOUNTER — Other Ambulatory Visit: Payer: Self-pay | Admitting: Family Medicine

## 2017-06-25 DIAGNOSIS — M5412 Radiculopathy, cervical region: Secondary | ICD-10-CM

## 2017-07-25 ENCOUNTER — Other Ambulatory Visit: Payer: Self-pay | Admitting: Family Medicine

## 2017-07-25 DIAGNOSIS — M5412 Radiculopathy, cervical region: Secondary | ICD-10-CM

## 2017-07-25 MED ORDER — GABAPENTIN 300 MG PO CAPS: 300.0000 mg | ORAL_CAPSULE | Freq: Three times a day (TID) | ORAL | 3 refills | Status: DC | PRN

## 2017-08-30 ENCOUNTER — Ambulatory Visit (INDEPENDENT_AMBULATORY_CARE_PROVIDER_SITE_OTHER): Payer: BLUE CROSS/BLUE SHIELD | Admitting: Family Medicine

## 2017-08-30 ENCOUNTER — Encounter: Payer: Self-pay | Admitting: Family Medicine

## 2017-08-30 VITALS — BP 124/68 | HR 78 | Temp 98.0°F | Resp 18 | Ht 70.0 in | Wt 312.0 lb

## 2017-08-30 DIAGNOSIS — I872 Venous insufficiency (chronic) (peripheral): Secondary | ICD-10-CM

## 2017-08-30 DIAGNOSIS — M79605 Pain in left leg: Secondary | ICD-10-CM | POA: Diagnosis not present

## 2017-08-30 DIAGNOSIS — M170 Bilateral primary osteoarthritis of knee: Secondary | ICD-10-CM | POA: Diagnosis not present

## 2017-08-30 DIAGNOSIS — I10 Essential (primary) hypertension: Secondary | ICD-10-CM

## 2017-08-30 DIAGNOSIS — M79604 Pain in right leg: Secondary | ICD-10-CM | POA: Diagnosis not present

## 2017-08-30 LAB — COMPLETE METABOLIC PANEL WITH GFR
AG Ratio: 1.6 (calc) (ref 1.0–2.5)
ALT: 19 U/L (ref 9–46)
AST: 17 U/L (ref 10–35)
Albumin: 4.4 g/dL (ref 3.6–5.1)
Alkaline phosphatase (APISO): 72 U/L (ref 40–115)
BUN: 15 mg/dL (ref 7–25)
CO2: 27 mmol/L (ref 20–32)
Calcium: 9.8 mg/dL (ref 8.6–10.3)
Chloride: 103 mmol/L (ref 98–110)
Creat: 0.87 mg/dL (ref 0.70–1.25)
GFR, EST NON AFRICAN AMERICAN: 91 mL/min/{1.73_m2} (ref >=60)
GFR, Est African American: 106 mL/min/{1.73_m2} (ref >=60)
GLOBULIN: 2.7 g/dL (ref 1.9–3.7)
Glucose, Bld: 96 mg/dL (ref 65–99)
Potassium: 4.4 mmol/L (ref 3.5–5.3)
SODIUM: 137 mmol/L (ref 135–146)
TOTAL PROTEIN: 7.1 g/dL (ref 6.1–8.1)
Total Bilirubin: 0.7 mg/dL (ref 0.2–1.2)

## 2017-08-30 LAB — LIPID PANEL
Cholesterol: 193 mg/dL
HDL: 52 mg/dL
LDL Cholesterol (Calc): 122 mg/dL — ABNORMAL HIGH
Non-HDL Cholesterol (Calc): 141 mg/dL — ABNORMAL HIGH
Total CHOL/HDL Ratio: 3.7 (calc)
Triglycerides: 91 mg/dL

## 2017-08-30 LAB — CBC WITH DIFFERENTIAL/PLATELET
Basophils Absolute: 59 {cells}/uL (ref 0–200)
Basophils Relative: 0.7 %
Eosinophils Absolute: 210 {cells}/uL (ref 15–500)
Eosinophils Relative: 2.5 %
HCT: 46 % (ref 38.5–50.0)
Hemoglobin: 16.2 g/dL (ref 13.2–17.1)
Lymphs Abs: 2663 {cells}/uL (ref 850–3900)
MCH: 31.6 pg (ref 27.0–33.0)
MCHC: 35.2 g/dL (ref 32.0–36.0)
MCV: 89.7 fL (ref 80.0–100.0)
MPV: 12.1 fL (ref 7.5–12.5)
Monocytes Relative: 7.2 %
Neutro Abs: 4864 {cells}/uL (ref 1500–7800)
Neutrophils Relative %: 57.9 %
Platelets: 249 10*3/uL (ref 140–400)
RBC: 5.13 Million/uL (ref 4.20–5.80)
RDW: 12.6 % (ref 11.0–15.0)
Total Lymphocyte: 31.7 %
WBC mixed population: 605 {cells}/uL (ref 200–950)
WBC: 8.4 10*3/uL (ref 3.8–10.8)

## 2017-08-30 MED ORDER — FUROSEMIDE 40 MG PO TABS: 40.0000 mg | ORAL_TABLET | Freq: Every day | ORAL | 3 refills | Status: DC

## 2017-08-30 MED ORDER — DICLOFENAC SODIUM 75 MG PO TBEC: 75.0000 mg | DELAYED_RELEASE_TABLET | Freq: Two times a day (BID) | ORAL | 0 refills | Status: DC

## 2017-08-30 NOTE — Progress Notes (Signed)
Subjective:    Patient ID: James Branch, male    DOB: 01/04/1956, 62 y.o.   MRN: [id number]  HPI  Patient is complaining of bilateral leg pain.  It is actually multifactorial.  He is complaining of bilateral knee pain located primarily over the medial compartment.  Knee pain is worse with prolonged standing and walking.  However he also has visible varicose veins over the medial shins bilaterally.  It is worse left greater than right.  Patient states that with prolonged standing and walking, his legs will swell more.  At that point the veins become extremely tender swollen and sore to the touch.  Furthermore his shins feel very tight and ache the more his legs swell.  Foot exam was performed today and the patient has excellent dorsalis pedis and posterior tibialis pulses bilaterally.  He has chronic venous stasis changes on both shins as well as numerous varicose veins in both legs.  He has tenderness with range of motion over the knees bilaterally as well as tenderness to palpation over the medial joint line.  Past Medical History:  Diagnosis Date  . COPD (chronic obstructive pulmonary disease) ([place])   . Diastolic dysfunction   . Hypertension   . Obesity   . OSA on CPAP    ahi-84, 18 CWP  . Smoker    Past Surgical History:  Procedure Laterality Date  . TONSILLECTOMY  1967   Current Outpatient Medications on File Prior to Visit  Medication Sig Dispense Refill  . albuterol (PROAIR HFA) 108 (90 Base) MCG/ACT inhaler INHALE 2 PUFFS INTO THE LUNGS 4 (FOUR) TIMES DAILY. 8.5 Inhaler 11  . gabapentin (NEURONTIN) 300 MG capsule Take 1 capsule (300 mg total) by mouth 3 (three) times daily as needed. 270 capsule 3  . hydrochlorothiazide (HYDRODIURIL) 25 MG tablet TAKE 1 TABLET BY MOUTH EVERY DAY 90 tablet 3  . ipratropium (ATROVENT) 0.03 % nasal spray Place 2 sprays into both nostrils 2 (two) times daily. 30 mL 0  . lisinopril (PRINIVIL,ZESTRIL) 20 MG tablet TAKE 1 TABLET BY MOUTH EVERY DAY 90  tablet 3  . meloxicam (MOBIC) 15 MG tablet TAKE 1 TABLET BY MOUTH EVERY DAY 90 tablet 1   No current facility-administered medications on file prior to visit.    Allergies  Allergen Reactions  . Penicillins Swelling   Social History   Socioeconomic History  . Marital status: Single    Spouse name: Not on file  . Number of children: Not on file  . Years of education: Not on file  . Highest education level: Not on file  Occupational History  . Not on file  Social Needs  . Financial resource strain: Not on file  . Food insecurity:    Worry: Not on file    Inability: Not on file  . Transportation needs:    Medical: Not on file    Non-medical: Not on file  Tobacco Use  . Smoking status: Former Smoker    Types: Cigarettes  . Smokeless tobacco: Never Used  Substance and Sexual Activity  . Alcohol use: No    Alcohol/week: 0.0 oz  . Drug use: No  . Sexual activity: Yes    Birth control/protection: Condom  Lifestyle  . Physical activity:    Days per week: Not on file    Minutes per session: Not on file  . Stress: Not on file  Relationships  . Social connections:    Talks on phone: Not on file  Gets together: Not on file    Attends religious service: Not on file    Active member of club or organization: Not on file    Attends meetings of clubs or organizations: Not on file    Relationship status: Not on file  . Intimate partner violence:    Fear of current or ex partner: Not on file    Emotionally abused: Not on file    Physically abused: Not on file    Forced sexual activity: Not on file  Other Topics Concern  . Not on file  Social History Narrative  . Not on file      Review of Systems  All other systems reviewed and are negative.      Objective:   Physical Exam  Constitutional: He appears well-developed and well-nourished. No distress.  Cardiovascular: Normal rate, regular rhythm and normal heart sounds.  Pulmonary/Chest: Effort normal and breath  sounds normal. No respiratory distress. He has no wheezes. He has no rales.  Musculoskeletal: He exhibits edema.       Right knee: He exhibits decreased range of motion. Tenderness found. Medial joint line tenderness noted.       Left knee: He exhibits decreased range of motion. Tenderness found. Medial joint line tenderness noted.  Skin: Rash noted. He is not diaphoretic.  Vitals reviewed.         Assessment & Plan:  Benign essential HTN - Plan: CBC with Differential/Platelet, COMPLETE METABOLIC PANEL WITH GFR, Lipid panel  Bilateral leg pain  Primary osteoarthritis of both knees  Chronic venous insufficiency  I believe his bilateral leg pain is multifactorial.  I believe he has medial compartment osteoarthritis and I have recommended diclofenac 75 mg p.o. twice daily.  I have also determined that he has chronic venous insufficiency and I believe this is contributing to his tightness and swelling in both legs with standing and walking.  I recommended knee-high compression hose bilaterally be worn daily.  Recommended discontinuing hydrochlorothiazide and replacing with Lasix 40 mg a day.  While the patient is here, I will check a CBC, CMP, and fasting lipid panel.  His blood pressure is adequately controlled.  However we need to reassess his renal function and cholesterol as it has not been checked in quite some time.  Recommended diet exercise and weight loss as well to help prevent progression of his knee pain

## 2017-09-04 ENCOUNTER — Other Ambulatory Visit: Payer: Self-pay | Admitting: *Deleted

## 2017-09-04 DIAGNOSIS — E785 Hyperlipidemia, unspecified: Secondary | ICD-10-CM

## 2017-09-04 MED ORDER — ATORVASTATIN CALCIUM 20 MG PO TABS: 20.0000 mg | ORAL_TABLET | Freq: Every day | ORAL | 3 refills | Status: DC

## 2017-09-26 ENCOUNTER — Other Ambulatory Visit: Payer: Self-pay | Admitting: Family Medicine

## 2017-10-13 ENCOUNTER — Ambulatory Visit (HOSPITAL_COMMUNITY)
Admission: EM | Admit: 2017-10-13 | Discharge: 2017-10-13 | Disposition: A | Discharge status: Home / Self Care | Payer: BLUE CROSS/BLUE SHIELD | Attending: Family Medicine | Admitting: Family Medicine

## 2017-10-13 ENCOUNTER — Encounter (HOSPITAL_COMMUNITY): Payer: Self-pay | Admitting: Emergency Medicine

## 2017-10-13 ENCOUNTER — Other Ambulatory Visit: Payer: Self-pay

## 2017-10-13 DIAGNOSIS — J441 Chronic obstructive pulmonary disease with (acute) exacerbation: Secondary | ICD-10-CM | POA: Diagnosis not present

## 2017-10-13 MED ORDER — HYDROCODONE-HOMATROPINE 5-1.5 MG/5ML PO SYRP: 5.0000 mL | ORAL_SOLUTION | Freq: Four times a day (QID) | ORAL | 0 refills | Status: DC | PRN

## 2017-10-13 MED ORDER — PREDNISONE 20 MG PO TABS: ORAL_TABLET | ORAL | 0 refills | Status: DC

## 2017-10-13 MED ORDER — AZITHROMYCIN 250 MG PO TABS: 250.0000 mg | ORAL_TABLET | Freq: Every day | ORAL | 0 refills | Status: DC

## 2017-10-13 NOTE — ED Provider Notes (Signed)
[facility] [facility]   [id number] 10/13/17 Arrival Time: 1209   SUBJECTIVE:  James Branch is a 61 y.o. male who presents to the urgent care with complaint of Head and chest congestion, onset Wednesday night.  Minimal fever the first two nights, none today  Patient is wheezing.  Is somewhat short of breath.  Patient works at [employer]  He has not missed any work so far.   Past Medical History:  Diagnosis Date  . COPD (chronic obstructive pulmonary disease) ([place])   . Diastolic dysfunction   . Hypertension   . Obesity   . OSA on CPAP    ahi-84, 18 CWP  . Smoker    Family History  Problem Relation Age of Onset  . Hypertension Mother   . Hypertension Father    Social History   Socioeconomic History  . Marital status: Single    Spouse name: Not on file  . Number of children: Not on file  . Years of education: Not on file  . Highest education level: Not on file  Occupational History  . Not on file  Social Needs  . Financial resource strain: Not on file  . Food insecurity:    Worry: Not on file    Inability: Not on file  . Transportation needs:    Medical: Not on file    Non-medical: Not on file  Tobacco Use  . Smoking status: Former Smoker    Types: Cigarettes  . Smokeless tobacco: Never Used  Substance and Sexual Activity  . Alcohol use: No    Alcohol/week: 0.0 oz  . Drug use: No  . Sexual activity: Yes    Birth control/protection: Condom  Lifestyle  . Physical activity:    Days per week: Not on file    Minutes per session: Not on file  . Stress: Not on file  Relationships  . Social connections:    Talks on phone: Not on file    Gets together: Not on file    Attends religious service: Not on file    Active member of club or organization: Not on file    Attends meetings of clubs or organizations: Not on file    Relationship status: Not on file  . Intimate partner violence:    Fear of current or ex partner: Not on file    Emotionally abused: Not on  file    Physically abused: Not on file    Forced sexual activity: Not on file  Other Topics Concern  . Not on file  Social History Narrative  . Not on file   No outpatient medications have been marked as taking for the 10/13/17 encounter [facility] Encounter).   Allergies  Allergen Reactions  . Penicillins Swelling      ROS: As per HPI, remainder of ROS negative.   OBJECTIVE:   Vitals:   10/13/17 1247  BP: 128/66  Pulse: 67  Resp: 18  Temp: 98 F (36.7 C)  TempSrc: Oral  SpO2: 96%     General appearance: alert; no distress Eyes: PERRL; EOM -right exophoria; conjunctiva normal HENT: normocephalic; atraumatic; ; oral mucosa normal Neck: supple Lungs: Diffuse sonorous rhonchi and wheezes on auscultation bilaterally Heart: regular rate and rhythm Back: no CVA tenderness Extremities: no cyanosis or edema; symmetrical with no gross deformities Skin: warm and dry Neurologic: normal gait; grossly normal Psychological: alert and cooperative; normal mood and affect      Labs:  Results for orders placed or performed in visit on 08/30/17  CBC with Differential/Platelet  Result Value Ref Range   WBC 8.4 3.8 - 10.8 Thousand/uL   RBC 5.13 4.20 - 5.80 Million/uL   Hemoglobin 16.2 13.2 - 17.1 g/dL   HCT 46.0 38.5 - 50.0 %   MCV 89.7 80.0 - 100.0 fL   MCH 31.6 27.0 - 33.0 pg   MCHC 35.2 32.0 - 36.0 g/dL   RDW 12.6 11.0 - 15.0 %   Platelets 249 140 - 400 Thousand/uL   MPV 12.1 7.5 - 12.5 fL   Neutro Abs 4,864 1,500 - 7,800 cells/uL   Lymphs Abs 2,663 850 - 3,900 cells/uL   WBC mixed population 605 200 - 950 cells/uL   Eosinophils Absolute 210 15 - 500 cells/uL   Basophils Absolute 59 0 - 200 cells/uL   Neutrophils Relative % 57.9 %   Total Lymphocyte 31.7 %   Monocytes Relative 7.2 %   Eosinophils Relative 2.5 %   Basophils Relative 0.7 %  COMPLETE METABOLIC PANEL WITH GFR  Result Value Ref Range   Glucose, Bld 96 65 - 99 mg/dL   BUN 15 7 - 25 mg/dL   Creat  0.87 0.70 - 1.25 mg/dL   GFR, Est Non African American 91 > OR = 60 mL/min/1.93m2   GFR, Est African American 106 > OR = 60 mL/min/1.5m2   BUN/Creatinine Ratio NOT APPLICABLE 6 - 22 (calc)   Sodium 137 135 - 146 mmol/L   Potassium 4.4 3.5 - 5.3 mmol/L   Chloride 103 98 - 110 mmol/L   CO2 27 20 - 32 mmol/L   Calcium 9.8 8.6 - 10.3 mg/dL   Total Protein 7.1 6.1 - 8.1 g/dL   Albumin 4.4 3.6 - 5.1 g/dL   Globulin 2.7 1.9 - 3.7 g/dL (calc)   AG Ratio 1.6 1.0 - 2.5 (calc)   Total Bilirubin 0.7 0.2 - 1.2 mg/dL   Alkaline phosphatase (APISO) 72 40 - 115 U/L   AST 17 10 - 35 U/L   ALT 19 9 - 46 U/L  Lipid panel  Result Value Ref Range   Cholesterol 193 <200 mg/dL   HDL 52 >40 mg/dL   Triglycerides 91 <150 mg/dL   LDL Cholesterol (Calc) 122 (H) mg/dL (calc)   Total CHOL/HDL Ratio 3.7 <5.0 (calc)   Non-HDL Cholesterol (Calc) 141 (H) <130 mg/dL (calc)    Labs Reviewed - No data to display  No results found.     ASSESSMENT & PLAN:  1. COPD exacerbation ([place])   Please return if symptoms are not significantly better by Monday or you are worsening.  Meds ordered this encounter  Medications  . predniSONE (DELTASONE) 20 MG tablet    Sig: Two daily with food    Dispense:  10 tablet    Refill:  0  . azithromycin (ZITHROMAX) 250 MG tablet    Sig: Take 1 tablet (250 mg total) by mouth daily. Take first 2 tablets together, then 1 every day until finished.    Dispense:  6 tablet    Refill:  0  . HYDROcodone-homatropine (HYDROMET) 5-1.5 MG/5ML syrup    Sig: Take 5 mLs by mouth every 6 (six) hours as needed for cough.    Dispense:  60 mL    Refill:  0    Reviewed expectations re: course of current medical issues. Questions answered. Outlined signs and symptoms indicating need for more acute intervention. Patient verbalized understanding. After Visit Summary given.     Robyn Haber, MD 10/13/17 1257

## 2017-10-13 NOTE — ED Triage Notes (Signed)
Head and chest congestion, onset Wednesday night.  Minimal fever the first two nights, none today

## 2017-10-13 NOTE — Discharge Instructions (Addendum)
Please return if symptoms are not significantly better by Monday or you are worsening.

## 2017-10-16 DIAGNOSIS — G4733 Obstructive sleep apnea (adult) (pediatric): Secondary | ICD-10-CM | POA: Diagnosis not present

## 2017-10-19 ENCOUNTER — Encounter: Payer: Self-pay | Admitting: Family Medicine

## 2017-10-19 ENCOUNTER — Ambulatory Visit: Payer: BLUE CROSS/BLUE SHIELD | Admitting: Family Medicine

## 2017-10-19 VITALS — BP 110/60 | HR 78 | Temp 98.0°F | Resp 20 | Ht 70.0 in | Wt 315.0 lb

## 2017-10-19 DIAGNOSIS — J441 Chronic obstructive pulmonary disease with (acute) exacerbation: Secondary | ICD-10-CM

## 2017-10-19 MED ORDER — LEVOFLOXACIN 500 MG PO TABS: 500.0000 mg | ORAL_TABLET | Freq: Every day | ORAL | 0 refills | Status: DC

## 2017-10-19 MED ORDER — PREDNISONE 20 MG PO TABS: 60.0000 mg | ORAL_TABLET | Freq: Every day | ORAL | 0 refills | Status: DC

## 2017-10-19 NOTE — Progress Notes (Signed)
Subjective:    Patient ID: James Branch, male    DOB: [birth year], 62 y.o.   MRN: [id number]  HPI Symptoms began 1 week ago. Patient reports coughing, chest congestion, wheezing, shortness of breath, and fever.  Patient states that his symptoms are worsening.  He reports worsening chest congestion.  He is audibly wheezing on exam today.  He has rhonchi present in the right lung.  He has expiratory wheezing in both lungs.  There is no respiratory distress.  He states that symptoms began with head congestion and rhinorrhea and subsequently settled into his chest and for the last 3 days he has been using albuterol every 4-6 hours with minimal relief. Past Medical History:  Diagnosis Date  . COPD (chronic obstructive pulmonary disease) ([place])   . Diastolic dysfunction   . Hypertension   . Obesity   . OSA on CPAP    ahi-84, 18 CWP  . Smoker    Past Surgical History:  Procedure Laterality Date  . TONSILLECTOMY  1967   Current Outpatient Medications on File Prior to Visit  Medication Sig Dispense Refill  . albuterol (PROAIR HFA) 108 (90 Base) MCG/ACT inhaler INHALE 2 PUFFS INTO THE LUNGS 4 (FOUR) TIMES DAILY. 8.5 Inhaler 11  . atorvastatin (LIPITOR) 20 MG tablet Take 1 tablet (20 mg total) by mouth daily. 90 tablet 3  . diclofenac (VOLTAREN) 75 MG EC tablet TAKE 1 TABLET BY MOUTH TWICE A DAY 60 tablet 3  . furosemide (LASIX) 40 MG tablet Take 1 tablet (40 mg total) by mouth daily. Stop hctz 30 tablet 3  . HYDROcodone-homatropine (HYDROMET) 5-1.5 MG/5ML syrup Take 5 mLs by mouth every 6 (six) hours as needed for cough. 60 mL 0  . ipratropium (ATROVENT) 0.03 % nasal spray Place 2 sprays into both nostrils 2 (two) times daily. 30 mL 0  . lisinopril (PRINIVIL,ZESTRIL) 20 MG tablet TAKE 1 TABLET BY MOUTH EVERY DAY 90 tablet 3   No current facility-administered medications on file prior to visit.    Allergies  Allergen Reactions  . Penicillins Swelling   Social History   Socioeconomic  History  . Marital status: Single    Spouse name: Not on file  . Number of children: Not on file  . Years of education: Not on file  . Highest education level: Not on file  Occupational History  . Not on file  Social Needs  . Financial resource strain: Not on file  . Food insecurity:    Worry: Not on file    Inability: Not on file  . Transportation needs:    Medical: Not on file    Non-medical: Not on file  Tobacco Use  . Smoking status: Former Smoker    Types: Cigarettes  . Smokeless tobacco: Never Used  Substance and Sexual Activity  . Alcohol use: No    Alcohol/week: 0.0 oz  . Drug use: No  . Sexual activity: Yes    Birth control/protection: Condom  Lifestyle  . Physical activity:    Days per week: Not on file    Minutes per session: Not on file  . Stress: Not on file  Relationships  . Social connections:    Talks on phone: Not on file    Gets together: Not on file    Attends religious service: Not on file    Active member of club or organization: Not on file    Attends meetings of clubs or organizations: Not on file    Relationship status:  Not on file  . Intimate partner violence:    Fear of current or ex partner: Not on file    Emotionally abused: Not on file    Physically abused: Not on file    Forced sexual activity: Not on file  Other Topics Concern  . Not on file  Social History Narrative  . Not on file      Review of Systems  All other systems reviewed and are negative.      Objective:   Physical Exam  HENT:  Right Ear: External ear normal.  Left Ear: External ear normal.  Nose: Nose normal.  Mouth/Throat: Oropharynx is clear and moist. No oropharyngeal exudate.  Cardiovascular: Normal rate, regular rhythm and normal heart sounds.  Pulmonary/Chest: No accessory muscle usage. No respiratory distress. He has decreased breath sounds. He has wheezes in the right upper field, the right lower field, the left upper field and the left lower field. He  has rhonchi in the right middle field and the right lower field.  Vitals reviewed.         Assessment & Plan:  COPD exacerbation ([place]) - Plan: levofloxacin (LEVAQUIN) 500 MG tablet, predniSONE (DELTASONE) 20 MG tablet  Patient's lungs sound terrible today.  Begin Levaquin 500 mg p.o. daily for 7 days and prednisone 60 mg a day for 5 days.  Use albuterol 2 puffs every 6 hours as needed.  Recheck next week if no better or sooner if worse.

## 2017-12-05 ENCOUNTER — Other Ambulatory Visit: Payer: Self-pay | Admitting: Family Medicine

## 2017-12-11 DIAGNOSIS — N480 Leukoplakia of penis: Secondary | ICD-10-CM | POA: Diagnosis not present

## 2017-12-11 DIAGNOSIS — N4883 Acquired buried penis: Secondary | ICD-10-CM | POA: Diagnosis not present

## 2017-12-11 DIAGNOSIS — G4733 Obstructive sleep apnea (adult) (pediatric): Secondary | ICD-10-CM | POA: Diagnosis not present

## 2017-12-12 ENCOUNTER — Other Ambulatory Visit: Payer: BLUE CROSS/BLUE SHIELD

## 2017-12-12 DIAGNOSIS — E785 Hyperlipidemia, unspecified: Secondary | ICD-10-CM

## 2017-12-12 LAB — LIPID PANEL
CHOL/HDL RATIO: 2.7 (calc) (ref <5.0)
Cholesterol: 126 mg/dL (ref <200)
HDL: 47 mg/dL (ref >40)
LDL CHOLESTEROL (CALC): 64 mg/dL
Non-HDL Cholesterol (Calc): 79 mg/dL (calc) (ref <130)
TRIGLYCERIDES: 70 mg/dL (ref <150)

## 2017-12-28 ENCOUNTER — Other Ambulatory Visit: Payer: Self-pay | Admitting: Family Medicine

## 2018-01-20 ENCOUNTER — Other Ambulatory Visit: Payer: Self-pay | Admitting: Family Medicine

## 2018-03-04 ENCOUNTER — Ambulatory Visit (INDEPENDENT_AMBULATORY_CARE_PROVIDER_SITE_OTHER): Payer: BLUE CROSS/BLUE SHIELD

## 2018-03-04 DIAGNOSIS — Z23 Encounter for immunization: Secondary | ICD-10-CM | POA: Diagnosis not present

## 2018-03-04 NOTE — Progress Notes (Signed)
Patient came in today to receive annul flu vaccine. Fluarix was given in the right deltoid. He tolerated well.  VIS given.

## 2018-06-28 ENCOUNTER — Other Ambulatory Visit: Payer: Self-pay | Admitting: *Deleted

## 2018-06-28 MED ORDER — FUROSEMIDE 40 MG PO TABS: 40.0000 mg | ORAL_TABLET | Freq: Every day | ORAL | 1 refills | Status: DC

## 2018-07-23 ENCOUNTER — Ambulatory Visit (INDEPENDENT_AMBULATORY_CARE_PROVIDER_SITE_OTHER): Payer: BLUE CROSS/BLUE SHIELD | Admitting: Family Medicine

## 2018-07-23 ENCOUNTER — Other Ambulatory Visit: Payer: Self-pay

## 2018-07-23 DIAGNOSIS — J441 Chronic obstructive pulmonary disease with (acute) exacerbation: Secondary | ICD-10-CM | POA: Diagnosis not present

## 2018-07-23 MED ORDER — PREDNISONE 20 MG PO TABS: 60.0000 mg | ORAL_TABLET | Freq: Every day | ORAL | 0 refills | Status: DC

## 2018-07-23 NOTE — Progress Notes (Addendum)
Subjective:    Patient ID: James Branch, male    DOB: Mar 07, 1956, 63 y.o.   MRN: [id number]  HPI  Patient is being seen today as a telephone visit.  He is currently at home.  He consents to be seen over the telephone.  I am currently in my office.  Phone call began at 1053.  Patient states that symptoms began a few days ago.  He has been dealing with seasonal pollen which is caused his allergies to flareup.  He has had rhinorrhea associated with this.  However a few days ago he started having wheezing and a dry nonproductive cough.  He denies any fevers or chills.  He denies any body aches.  He denies any nausea or vomiting or diarrhea.  He denies any sore throat.  Instead he states that he is having an asthma-like cough and wheezing and some mild shortness of breath.  The cough is worse at night.  He denies any purulent sputum.  He denies any hemoptysis.  He denies any chest pain.  He denies any fever.  He checked his vital signs for me today.  His temperature is 98.7.  His blood pressure is 118/67.  His heart rate 70.  He is talking in full and complete sentences with no respiratory distress and no increased work of breathing. Past Medical History:  Diagnosis Date  . COPD (chronic obstructive pulmonary disease) ([place])   . Diastolic dysfunction   . Hypertension   . Obesity   . OSA on CPAP    ahi-84, 18 CWP  . Smoker    Past Surgical History:  Procedure Laterality Date  . TONSILLECTOMY  1967   Current Outpatient Medications on File Prior to Visit  Medication Sig Dispense Refill  . albuterol (PROVENTIL HFA;VENTOLIN HFA) 108 (90 Base) MCG/ACT inhaler INHALE 2 PUFFS INTO THE LUNGS 4 (FOUR) TIMES DAILY. 8.5 Inhaler 11  . atorvastatin (LIPITOR) 20 MG tablet Take 1 tablet (20 mg total) by mouth daily. 90 tablet 3  . diclofenac (VOLTAREN) 75 MG EC tablet TAKE 1 TABLET BY MOUTH TWICE A DAY 60 tablet 3  . furosemide (LASIX) 40 MG tablet Take 1 tablet (40 mg total) by mouth daily. Stop hctz 90  tablet 1  . HYDROcodone-homatropine (HYDROMET) 5-1.5 MG/5ML syrup Take 5 mLs by mouth every 6 (six) hours as needed for cough. 60 mL 0  . ipratropium (ATROVENT) 0.03 % nasal spray Place 2 sprays into both nostrils 2 (two) times daily. 30 mL 0  . lisinopril (PRINIVIL,ZESTRIL) 20 MG tablet TAKE 1 TABLET BY MOUTH EVERY DAY 90 tablet 3   No current facility-administered medications on file prior to visit.    Allergies  Allergen Reactions  . Penicillins Swelling   Social History   Socioeconomic History  . Marital status: Single    Spouse name: Not on file  . Number of children: Not on file  . Years of education: Not on file  . Highest education level: Not on file  Occupational History  . Not on file  Social Needs  . Financial resource strain: Not on file  . Food insecurity:    Worry: Not on file    Inability: Not on file  . Transportation needs:    Medical: Not on file    Non-medical: Not on file  Tobacco Use  . Smoking status: Former Smoker    Types: Cigarettes  . Smokeless tobacco: Never Used  Substance and Sexual Activity  . Alcohol use: No  Alcohol/week: 0.0 standard drinks  . Drug use: No  . Sexual activity: Yes    Birth control/protection: Condom  Lifestyle  . Physical activity:    Days per week: Not on file    Minutes per session: Not on file  . Stress: Not on file  Relationships  . Social connections:    Talks on phone: Not on file    Gets together: Not on file    Attends religious service: Not on file    Active member of club or organization: Not on file    Attends meetings of clubs or organizations: Not on file    Relationship status: Not on file  . Intimate partner violence:    Fear of current or ex partner: Not on file    Emotionally abused: Not on file    Physically abused: Not on file    Forced sexual activity: Not on file  Other Topics Concern  . Not on file  Social History Narrative  . Not on file     Review of Systems  All other systems  reviewed and are negative.      Objective:   Physical Exam   No physical exam was performed today as this was a telephone visit     Assessment & Plan:  COPD exacerbation ([place]) - Plan: predniSONE (DELTASONE) 20 MG tablet   Patient has COPD/asthma.  I believe he is having an exacerbation primarily of his asthma due to seasonal allergies.  Therefore I will start the patient on prednisone 60 mg a day for 5 days.  He can use albuterol 2 puffs inhaled every 4-6 hours as needed.  He is to call me back if he develops any purulent sputum or sputum at all, fever, worsening shortness of breath, or chest pain.  At the present time his symptoms do not sound infectious.  Instead I believe this is more of an asthma exacerbation.  Patient is comfortable with this plan and will notify me immediately if his symptoms change Phone call concluded at 11 AM  Of note, patient has a history of obstructive sleep apnea.  He is compliant with his CPAP machine and wears it for approximately 6 hours every evening depending on how long he sleeps.  Patient received substantial benefit both subjectively and objectively from wearing his CPAP.

## 2018-08-07 ENCOUNTER — Other Ambulatory Visit: Payer: Self-pay | Admitting: *Deleted

## 2018-08-07 ENCOUNTER — Other Ambulatory Visit: Payer: Self-pay | Admitting: Family Medicine

## 2018-08-07 MED ORDER — ATORVASTATIN CALCIUM 20 MG PO TABS: 20.0000 mg | ORAL_TABLET | Freq: Every day | ORAL | 3 refills | Status: DC

## 2018-09-16 ENCOUNTER — Telehealth: Payer: Self-pay | Admitting: Family Medicine

## 2018-09-16 NOTE — Telephone Encounter (Signed)
Pt needs script for new cpap machine to [facility]

## 2018-09-18 NOTE — Telephone Encounter (Signed)
Script sent over to [facility]

## 2018-09-24 ENCOUNTER — Encounter: Payer: Self-pay | Admitting: Family Medicine

## 2018-09-24 MED ORDER — LISINOPRIL 20 MG PO TABS: 20.0000 mg | ORAL_TABLET | Freq: Every day | ORAL | 3 refills | Status: DC

## 2018-09-24 MED ORDER — ATORVASTATIN CALCIUM 20 MG PO TABS: 20.0000 mg | ORAL_TABLET | Freq: Every day | ORAL | 3 refills | Status: DC

## 2018-09-24 MED ORDER — FUROSEMIDE 40 MG PO TABS: 40.0000 mg | ORAL_TABLET | Freq: Every day | ORAL | 3 refills | Status: DC

## 2018-09-30 ENCOUNTER — Other Ambulatory Visit: Payer: Self-pay | Admitting: Family Medicine

## 2018-09-30 DIAGNOSIS — G4733 Obstructive sleep apnea (adult) (pediatric): Secondary | ICD-10-CM | POA: Diagnosis not present

## 2018-09-30 MED ORDER — DICLOFENAC SODIUM 75 MG PO TBEC: 75.0000 mg | DELAYED_RELEASE_TABLET | Freq: Two times a day (BID) | ORAL | 3 refills | Status: DC

## 2018-09-30 MED ORDER — ALBUTEROL SULFATE HFA 108 (90 BASE) MCG/ACT IN AERS: 2.0000 | INHALATION_SPRAY | Freq: Four times a day (QID) | RESPIRATORY_TRACT | 11 refills | Status: DC | PRN

## 2018-10-30 DIAGNOSIS — G4733 Obstructive sleep apnea (adult) (pediatric): Secondary | ICD-10-CM | POA: Diagnosis not present

## 2018-11-30 DIAGNOSIS — G4733 Obstructive sleep apnea (adult) (pediatric): Secondary | ICD-10-CM | POA: Diagnosis not present

## 2018-12-12 DIAGNOSIS — G4733 Obstructive sleep apnea (adult) (pediatric): Secondary | ICD-10-CM | POA: Diagnosis not present

## 2018-12-18 ENCOUNTER — Encounter: Payer: Self-pay | Admitting: Family Medicine

## 2018-12-30 ENCOUNTER — Other Ambulatory Visit: Payer: Self-pay | Admitting: Family Medicine

## 2018-12-31 ENCOUNTER — Other Ambulatory Visit: Payer: Self-pay

## 2018-12-31 DIAGNOSIS — G4733 Obstructive sleep apnea (adult) (pediatric): Secondary | ICD-10-CM | POA: Diagnosis not present

## 2018-12-31 NOTE — Patient Outreach (Signed)
[facility]) Care Management  12/31/2018  KAYVON BROADDUS [birth year] [id number]   Medication Adherence call to Mr. Talley Schlund [facility] Ident[facility] Verify Spoke with patients wife she explain patient is taking Lisinopril 20 mg and Atorvastatin 20 mg patient explain they just pick up all his medications yesterday he take 1 tablet daily on both medications patient had extras from before Mr. Hilyard is showing past due under [place].   [facility] Management Direct Dial [phone]  Fax [phone] [email or web address]

## 2019-01-15 DIAGNOSIS — H527 Unspecified disorder of refraction: Secondary | ICD-10-CM | POA: Diagnosis not present

## 2019-01-27 ENCOUNTER — Other Ambulatory Visit: Payer: Self-pay | Admitting: Family Medicine

## 2019-01-30 ENCOUNTER — Other Ambulatory Visit: Payer: Self-pay

## 2019-01-30 DIAGNOSIS — G4733 Obstructive sleep apnea (adult) (pediatric): Secondary | ICD-10-CM | POA: Diagnosis not present

## 2019-01-31 ENCOUNTER — Encounter: Payer: Self-pay | Admitting: Family Medicine

## 2019-01-31 ENCOUNTER — Ambulatory Visit (INDEPENDENT_AMBULATORY_CARE_PROVIDER_SITE_OTHER): Payer: Medicare Other | Admitting: Family Medicine

## 2019-01-31 VITALS — BP 108/62 | HR 79 | Temp 98.2°F | Resp 18 | Ht 70.0 in | Wt 340.0 lb

## 2019-01-31 DIAGNOSIS — Z0000 Encounter for general adult medical examination without abnormal findings: Secondary | ICD-10-CM

## 2019-01-31 DIAGNOSIS — E785 Hyperlipidemia, unspecified: Secondary | ICD-10-CM

## 2019-01-31 DIAGNOSIS — I10 Essential (primary) hypertension: Secondary | ICD-10-CM

## 2019-01-31 DIAGNOSIS — G4733 Obstructive sleep apnea (adult) (pediatric): Secondary | ICD-10-CM

## 2019-01-31 DIAGNOSIS — Z125 Encounter for screening for malignant neoplasm of prostate: Secondary | ICD-10-CM

## 2019-01-31 DIAGNOSIS — Z23 Encounter for immunization: Secondary | ICD-10-CM | POA: Diagnosis not present

## 2019-01-31 DIAGNOSIS — I872 Venous insufficiency (chronic) (peripheral): Secondary | ICD-10-CM

## 2019-01-31 DIAGNOSIS — Z1211 Encounter for screening for malignant neoplasm of colon: Secondary | ICD-10-CM

## 2019-01-31 DIAGNOSIS — Z9989 Dependence on other enabling machines and devices: Secondary | ICD-10-CM

## 2019-01-31 DIAGNOSIS — Z122 Encounter for screening for malignant neoplasm of respiratory organs: Secondary | ICD-10-CM

## 2019-01-31 DIAGNOSIS — Z136 Encounter for screening for cardiovascular disorders: Secondary | ICD-10-CM

## 2019-01-31 DIAGNOSIS — M170 Bilateral primary osteoarthritis of knee: Secondary | ICD-10-CM

## 2019-01-31 DIAGNOSIS — F17200 Nicotine dependence, unspecified, uncomplicated: Secondary | ICD-10-CM

## 2019-01-31 NOTE — Addendum Note (Signed)
Addended by: Vanice Sarah on: 01/31/2019 09:23 AM   Modules accepted: Orders

## 2019-01-31 NOTE — Progress Notes (Signed)
Subjective:    Patient ID: James Branch, male    DOB: February 17, 1956, 63 y.o.   MRN: [id number]  HPI  Patient presents today for complete physical exam.  He denies any medical concerns.  However he is due for several screening test.  He states is been more than 12 years since his last colonoscopy.  He is due for prostate cancer screening.  He has smoked since age 77.  He averages about a pack a day.  Therefore he has 30+ pack year history of smoking.  Therefore he is due for lung cancer screening with a CT scan.  He is also due for AAA screening with an abdominal ultrasound.  Regarding his immunizations he is due for a flu shot.  He is also due for Pneumovax 23.  He is also due for fasting lab work.  He denies any issues with falls, depression, or memory loss.  Past Medical History:  Diagnosis Date  . COPD (chronic obstructive pulmonary disease) ([place])   . Diastolic dysfunction   . Hypertension   . Obesity   . OSA on CPAP    ahi-84, 18 CWP  . Smoker    Past Surgical History:  Procedure Laterality Date  . TONSILLECTOMY  1967   Current Outpatient Medications on File Prior to Visit  Medication Sig Dispense Refill  . albuterol (VENTOLIN HFA) 108 (90 Base) MCG/ACT inhaler INHALE 2 PUFFS INTO THE LUNGS 4 (FOUR) TIMES DAILY. 8.5 g 11  . atorvastatin (LIPITOR) 20 MG tablet Take 1 tablet (20 mg total) by mouth daily. 90 tablet 3  . diclofenac (VOLTAREN) 75 MG EC tablet TAKE 1 TABLET BY MOUTH TWICE A DAY 60 tablet 0  . furosemide (LASIX) 40 MG tablet Take 1 tablet (40 mg total) by mouth daily. Stop hctz 90 tablet 3  . HYDROcodone-homatropine (HYDROMET) 5-1.5 MG/5ML syrup Take 5 mLs by mouth every 6 (six) hours as needed for cough. 60 mL 0  . ipratropium (ATROVENT) 0.03 % nasal spray Place 2 sprays into both nostrils 2 (two) times daily. 30 mL 0  . lisinopril (ZESTRIL) 20 MG tablet Take 1 tablet (20 mg total) by mouth daily. 90 tablet 3  . predniSONE (DELTASONE) 20 MG tablet Take 3 tablets (60 mg  total) by mouth daily with breakfast. 15 tablet 0   No current facility-administered medications on file prior to visit.    Allergies  Allergen Reactions  . Penicillins Swelling   Social History   Socioeconomic History  . Marital status: Single    Spouse name: Not on file  . Number of children: Not on file  . Years of education: Not on file  . Highest education level: Not on file  Occupational History  . Not on file  Social Needs  . Financial resource strain: Not on file  . Food insecurity    Worry: Not on file    Inability: Not on file  . Transportation needs    Medical: Not on file    Non-medical: Not on file  Tobacco Use  . Smoking status: Former Smoker    Types: Cigarettes  . Smokeless tobacco: Never Used  Substance and Sexual Activity  . Alcohol use: No    Alcohol/week: 0.0 standard drinks  . Drug use: No  . Sexual activity: Yes    Birth control/protection: Condom  Lifestyle  . Physical activity    Days per week: Not on file    Minutes per session: Not on file  . Stress: Not  on file  Relationships  . Social Herbalist on phone: Not on file    Gets together: Not on file    Attends religious service: Not on file    Active member of club or organization: Not on file    Attends meetings of clubs or organizations: Not on file    Relationship status: Not on file  . Intimate partner violence    Fear of current or ex partner: Not on file    Emotionally abused: Not on file    Physically abused: Not on file    Forced sexual activity: Not on file  Other Topics Concern  . Not on file  Social History Narrative  . Not on file      Review of Systems  All other systems reviewed and are negative.      Objective:   Physical Exam  Constitutional: He is oriented to person, place, and time. He appears well-developed and well-nourished. No distress.  HENT:  Head: Normocephalic and atraumatic.  Right Ear: External ear normal.  Left Ear: External ear  normal.  Nose: Nose normal.  Mouth/Throat: Oropharynx is clear and moist. No oropharyngeal exudate.  Eyes: Pupils are equal, round, and reactive to light. Conjunctivae and EOM are normal. Right eye exhibits no discharge. Left eye exhibits no discharge. No scleral icterus.  Neck: Normal range of motion. Neck supple. No JVD present. No tracheal deviation present. No thyromegaly present.  Cardiovascular: Normal rate, regular rhythm and normal heart sounds. Exam reveals no gallop and no friction rub.  No murmur heard. Pulmonary/Chest: Effort normal and breath sounds normal. No stridor. No respiratory distress. He has no wheezes. He has no rales. He exhibits no tenderness.  Abdominal: Soft. Bowel sounds are normal. He exhibits no distension and no mass. There is no abdominal tenderness. There is no rebound and no guarding. A hernia is present.    Musculoskeletal:        General: Edema present. No tenderness or deformity.     Right knee: He exhibits decreased range of motion.     Left knee: He exhibits decreased range of motion.  Lymphadenopathy:    He has no cervical adenopathy.  Neurological: He is alert and oriented to person, place, and time. He has normal reflexes. He displays normal reflexes. No cranial nerve deficit.  Skin: Skin is warm. No rash noted. He is not diaphoretic. No erythema. No pallor.  Psychiatric: He has a normal mood and affect. His behavior is normal. Judgment and thought content normal.  Vitals reviewed. Large umbilical hernia        Assessment & Plan:  General medical exam  Benign essential HTN - Plan: CBC with Differential/Platelet, COMPLETE METABOLIC PANEL WITH GFR, Lipid panel  Hyperlipidemia, unspecified hyperlipidemia type - Plan: CBC with Differential/Platelet, COMPLETE METABOLIC PANEL WITH GFR, Lipid panel  Primary osteoarthritis of both knees  OSA on CPAP  Smoker  Chronic venous insufficiency  Prostate cancer screening - Plan: PSA  Colon cancer  screening - Plan: Ambulatory referral to Gastroenterology  Encounter for abdominal aortic aneurysm (AAA) screening - Plan: VAS [place] AAA DUPLEX  Encounter for screening for lung cancer  Encounter for screening for malignant neoplasm of respiratory organs - Plan: CT CHEST LUNG CA SCREEN LOW DOSE W/O CM  Blood pressure today is excellent.  His fall screen is negative.  His depression screen is negative.  He denies any issues with memory loss.  However patient is morbidly obese.  I recommended diet  exercise and weight loss.  He also continues to smoke.  I recommended smoking cessation.  I will check a CBC, CMP, fasting lipid panel.  I will screen for lung cancer with a low-dose CT scan of the chest.  I will screen for AAA with an abdominal ultrasound.  I will schedule the patient to see his gastroenterologist for colonoscopy.  I will screen for prostate cancer with a PSA.  Patient also has a faint right-sided carotid bruit on exam.  It is very faint.  I recommended aspirin 81 mg a day for primary stroke prevention.

## 2019-02-01 LAB — COMPLETE METABOLIC PANEL WITH GFR
AG Ratio: 1.8 (calc) (ref 1.0–2.5)
ALT: 35 U/L (ref 9–46)
AST: 22 U/L (ref 10–35)
Albumin: 4.3 g/dL (ref 3.6–5.1)
Alkaline phosphatase (APISO): 63 U/L (ref 35–144)
BUN: 25 mg/dL (ref 7–25)
CO2: 20 mmol/L (ref 20–32)
Calcium: 9.5 mg/dL (ref 8.6–10.3)
Chloride: 106 mmol/L (ref 98–110)
Creat: 1 mg/dL (ref 0.70–1.25)
GFR, Est African American: 91 mL/min/{1.73_m2} (ref >=60)
GFR, Est Non African American: 79 mL/min/{1.73_m2} (ref >=60)
Globulin: 2.4 g/dL (calc) (ref 1.9–3.7)
Glucose, Bld: 106 mg/dL — ABNORMAL HIGH (ref 65–99)
Potassium: 4.4 mmol/L (ref 3.5–5.3)
Sodium: 141 mmol/L (ref 135–146)
Total Bilirubin: 0.7 mg/dL (ref 0.2–1.2)
Total Protein: 6.7 g/dL (ref 6.1–8.1)

## 2019-02-01 LAB — PSA: PSA: 0.2 ng/mL (ref <=4.0)

## 2019-02-01 LAB — CBC WITH DIFFERENTIAL/PLATELET
Absolute Monocytes: 490 cells/uL (ref 200–950)
Basophils Absolute: 63 cells/uL (ref 0–200)
Basophils Relative: 0.8 %
Eosinophils Absolute: 213 cells/uL (ref 15–500)
Eosinophils Relative: 2.7 %
HCT: 44.9 % (ref 38.5–50.0)
Hemoglobin: 15 g/dL (ref 13.2–17.1)
Lymphs Abs: 2568 cells/uL (ref 850–3900)
MCH: 31.3 pg (ref 27.0–33.0)
MCHC: 33.4 g/dL (ref 32.0–36.0)
MCV: 93.5 fL (ref 80.0–100.0)
MPV: 12.8 fL — ABNORMAL HIGH (ref 7.5–12.5)
Monocytes Relative: 6.2 %
Neutro Abs: 4566 cells/uL (ref 1500–7800)
Neutrophils Relative %: 57.8 %
Platelets: 243 10*3/uL (ref 140–400)
RBC: 4.8 10*6/uL (ref 4.20–5.80)
RDW: 13.1 % (ref 11.0–15.0)
Total Lymphocyte: 32.5 %
WBC: 7.9 10*3/uL (ref 3.8–10.8)

## 2019-02-01 LAB — LIPID PANEL
Cholesterol: 127 mg/dL (ref <200)
HDL: 46 mg/dL (ref >=40)
LDL Cholesterol (Calc): 63 mg/dL (calc)
Non-HDL Cholesterol (Calc): 81 mg/dL (calc) (ref <130)
Total CHOL/HDL Ratio: 2.8 (calc) (ref <5.0)
Triglycerides: 93 mg/dL (ref <150)

## 2019-02-05 ENCOUNTER — Encounter: Payer: Self-pay | Admitting: Gastroenterology

## 2019-02-11 ENCOUNTER — Ambulatory Visit: Payer: Medicare Other

## 2019-02-11 ENCOUNTER — Ambulatory Visit
Admission: RE | Admit: 2019-02-11 | Discharge: 2019-02-11 | Disposition: A | Discharge status: Home / Self Care | Payer: Medicare Other | Source: Ambulatory Visit | Attending: Family Medicine | Admitting: Family Medicine

## 2019-02-11 DIAGNOSIS — Z122 Encounter for screening for malignant neoplasm of respiratory organs: Secondary | ICD-10-CM

## 2019-02-11 DIAGNOSIS — Z87891 Personal history of nicotine dependence: Secondary | ICD-10-CM | POA: Diagnosis not present

## 2019-02-13 ENCOUNTER — Encounter: Payer: Self-pay | Admitting: Family Medicine

## 2019-02-13 ENCOUNTER — Other Ambulatory Visit: Payer: Self-pay | Admitting: Family Medicine

## 2019-02-13 DIAGNOSIS — I2510 Atherosclerotic heart disease of native coronary artery without angina pectoris: Secondary | ICD-10-CM

## 2019-02-13 DIAGNOSIS — I2584 Coronary atherosclerosis due to calcified coronary lesion: Secondary | ICD-10-CM | POA: Insufficient documentation

## 2019-02-14 ENCOUNTER — Ambulatory Visit: Payer: Medicare Other

## 2019-02-17 ENCOUNTER — Ambulatory Visit (HOSPITAL_COMMUNITY)
Admission: RE | Admit: 2019-02-17 | Discharge: 2019-02-17 | Disposition: A | Discharge status: Home / Self Care | Payer: Medicare Other | Source: Ambulatory Visit | Attending: Cardiology | Admitting: Cardiology

## 2019-02-17 ENCOUNTER — Other Ambulatory Visit: Payer: Self-pay

## 2019-02-17 ENCOUNTER — Ambulatory Visit: Payer: Medicare Other

## 2019-02-17 DIAGNOSIS — Z136 Encounter for screening for cardiovascular disorders: Secondary | ICD-10-CM | POA: Diagnosis not present

## 2019-02-17 DIAGNOSIS — I2510 Atherosclerotic heart disease of native coronary artery without angina pectoris: Secondary | ICD-10-CM | POA: Insufficient documentation

## 2019-02-17 DIAGNOSIS — Z87891 Personal history of nicotine dependence: Secondary | ICD-10-CM | POA: Insufficient documentation

## 2019-02-20 ENCOUNTER — Other Ambulatory Visit: Payer: Self-pay

## 2019-02-20 ENCOUNTER — Ambulatory Visit (INDEPENDENT_AMBULATORY_CARE_PROVIDER_SITE_OTHER): Payer: Medicare Other | Admitting: Cardiology

## 2019-02-20 ENCOUNTER — Encounter: Payer: Self-pay | Admitting: Cardiology

## 2019-02-20 VITALS — BP 140/74 | HR 86 | Temp 98.7°F | Ht 70.0 in | Wt 342.0 lb

## 2019-02-20 DIAGNOSIS — I2510 Atherosclerotic heart disease of native coronary artery without angina pectoris: Secondary | ICD-10-CM

## 2019-02-20 DIAGNOSIS — E782 Mixed hyperlipidemia: Secondary | ICD-10-CM

## 2019-02-20 NOTE — Patient Instructions (Signed)
Physical activity recommendation (The Physical Activity Guidelines for Americans. JAMA 2018;Nov 12) At least 150-300 minutes a week of moderate-intensity, or 75-150 minutes a week of vigorous-intensity aerobic physical activity, or an equivalent combination of moderate- and vigorous-intensity aerobic activity. Adults should perform muscle-strengthening activities on 2 or more days a week. Older adults should do multicomponent physical activity that includes balance training as well as aerobic and muscle-strengthening activities. Benefits of increased physical activity include lower risk of mortality including cardiovascular mortality, lower risk of cardiovascular events and associated risk factors (hypertension and diabetes), and lower risk of many cancers (including bladder, breast, colon, endometrium, esophagus, kidney, lung, and stomach). Additional improvments have been seen in cognition, risk of dementia, anxiety and depression, improved bone health, lower risk of falls, and associated injuries.  Dietary recommendation The 2019 ACC/AHA guidelines promote nutrition as a main fixture of cardiovascular wellness, with a recommendation for a varied diet of fruit, vegetables, fish, legumes, and whole grains (Class I), as well as recommendations to reduce sodium, cholesterol, processed meats, and refined sugars (Class IIa recommendation).10 Sodium intake, a topic of some controversy as of late, is recommended to be kept at 1,500 mg/day or less, far below the average daily intake in the [place] of 3,409 mg/day, and notably below that of previous [place] recommendations for 300mg /day.10,11 For those unable to reach 1,500 mg/day, they recommend at least a reduction of 1000 mg/day.  A Pesco-Mediterranean Diet With Intermittent Fasting: JACC Review Topic of the Week. J Am Coll Cardiol [id number] Pesco-Mediterranean diet, it is supplemented with extra-virgin olive oil (EVOO), which is the principle fat source, along  with moderate amounts of dairy (particularly yogurt and cheese) and eggs, as well as modest amounts of alcohol consumption (ideally red wine with the evening meal), but few red and processed meats.

## 2019-02-20 NOTE — Progress Notes (Signed)
Patient referred by [name], MD for coronary artery calcification.  Subjective:   James Branch, male    DOB: 01/03/1956, 63 y.o.   MRN: [id number]  Chief Complaint  Patient presents with  . Coronary Artery Disease  . AAA  . New Patient (Initial Visit)    HPI  63 y.o. Caucasian male with controlled hypertension, hyperlipidemia, tobacco abuse, morbid obesity, venous insufficiency, referred for evaluation of coronary artery calcification.  Patient recently underwent lung cancer CT scan, which showed left main and three vessel atherosclerosis. He reportedly underwent stress test several years ago, records not available to me. Patient walks on treadmill for 25 min without any complaints of chest pain, shortness of breath. He is trying to quit smoking, and is down from 1 pack per day to 1-2 cigarettes/day.   He has gained weight since he retired, and is now trying to lose weight.    Past Medical History:  Diagnosis Date  . CAD (coronary artery disease)   . COPD (chronic obstructive pulmonary disease) ([place])   . Diastolic dysfunction   . Hypertension   . Obesity   . OSA on CPAP    ahi-84, 18 CWP  . Smoker      Past Surgical History:  Procedure Laterality Date  . TONSILLECTOMY  1967     Social History   Socioeconomic History  . Marital status: Married    Spouse name: Not on file  . Number of children: Not on file  . Years of education: Not on file  . Highest education level: Not on file  Occupational History  . Not on file  Social Needs  . Financial resource strain: Not on file  . Food insecurity    Worry: Not on file    Inability: Not on file  . Transportation needs    Medical: Not on file    Non-medical: Not on file  Tobacco Use  . Smoking status: Former Smoker    Types: Cigarettes  . Smokeless tobacco: Never Used  Substance and Sexual Activity  . Alcohol use: No    Alcohol/week: 0.0 standard drinks  . Drug use: No  . Sexual activity: Yes     Birth control/protection: Condom  Lifestyle  . Physical activity    Days per week: Not on file    Minutes per session: Not on file  . Stress: Not on file  Relationships  . Social Herbalist on phone: Not on file    Gets together: Not on file    Attends religious service: Not on file    Active member of club or organization: Not on file    Attends meetings of clubs or organizations: Not on file    Relationship status: Not on file  . Intimate partner violence    Fear of current or ex partner: Not on file    Emotionally abused: Not on file    Physically abused: Not on file    Forced sexual activity: Not on file  Other Topics Concern  . Not on file  Social History Narrative  . Not on file     Family History  Problem Relation Age of Onset  . Hypertension Mother   . Hypertension Father      Current Outpatient Medications on File Prior to Visit  Medication Sig Dispense Refill  . albuterol (VENTOLIN HFA) 108 (90 Base) MCG/ACT inhaler INHALE 2 PUFFS INTO THE LUNGS 4 (FOUR) TIMES DAILY. 8.5 g 11  .  aspirin EC 81 MG tablet Take 81 mg by mouth daily.    Marland Kitchen atorvastatin (LIPITOR) 20 MG tablet Take 1 tablet (20 mg total) by mouth daily. 90 tablet 3  . diclofenac (VOLTAREN) 75 MG EC tablet TAKE 1 TABLET BY MOUTH TWICE A DAY 60 tablet 0  . furosemide (LASIX) 40 MG tablet Take 1 tablet (40 mg total) by mouth daily. Stop hctz 90 tablet 3  . lisinopril (ZESTRIL) 20 MG tablet Take 1 tablet (20 mg total) by mouth daily. 90 tablet 3   No current facility-administered medications on file prior to visit.     Cardiovascular studies:  EKG 02/20/2019: Sinus James 72 bpm. Normal EKG.  CT chest 02/11/2019: 1. Lung-RADS 2S, benign appearance or behavior. Continue annual screening with low-dose chest CT without contrast in 12 months. 2. The "S" modifier above refers to potentially clinically significant non lung cancer related findings. Specifically, there is aortic  atherosclerosis, in addition to left main and 3 vessel coronary artery disease. Please note that although the presence of coronary artery calcium documents the presence of coronary artery disease, the severity of this disease and any potential stenosis cannot be assessed on this non-gated CT examination. Assessment for potential risk factor modification, dietary therapy or pharmacologic therapy may be warranted, if clinically indicated. 3. Mild diffuse bronchial wall thickening with very mild centrilobular and paraseptal emphysema; imaging findings suggestive of underlying COPD. 4. Hepatic steatosis.  Aortic Atherosclerosis (ICD10-I70.0) and Emphysema (ICD10-J43.9).   Recent labs: Results for James Branch, James Branch (MRN [id number]) as of 02/20/2019 10:26  Ref. Range 01/31/2019 09:00  Sodium Latest Ref Range: 135 - 146 mmol/L 141  Potassium Latest Ref Range: 3.5 - 5.3 mmol/L 4.4  Chloride Latest Ref Range: 98 - 110 mmol/L 106  CO2 Latest Ref Range: 20 - 32 mmol/L 20  Glucose Latest Ref Range: 65 - 99 mg/dL 106 (H)  BUN Latest Ref Range: 7 - 25 mg/dL 25  Creatinine Latest Ref Range: 0.70 - 1.25 mg/dL 1.00  Calcium Latest Ref Range: 8.6 - 10.3 mg/dL 9.5  BUN/Creatinine Ratio Latest Ref Range: 6 - 22 (calc) NOT APPLICABLE  AG Ratio Latest Ref Range: 1.0 - 2.5 (calc) 1.8  AST Latest Ref Range: 10 - 35 U/L 22  ALT Latest Ref Range: 9 - 46 U/L 35  Total Protein Latest Ref Range: 6.1 - 8.1 g/dL 6.7  Total Bilirubin Latest Ref Range: 0.2 - 1.2 mg/dL 0.7  GFR, Est Non African American Latest Ref Range: > OR = 60 mL/min/1.2m2 79  GFR, Est African American Latest Ref Range: > OR = 60 mL/min/1.24m2 91   Results for James Branch, James Branch (MRN [id number]) as of 02/20/2019 10:26  Ref. Range 01/31/2019 09:00  WBC Latest Ref Range: 3.8 - 10.8 Thousand/uL 7.9  RBC Latest Ref Range: 4.20 - 5.80 Million/uL 4.80  Hemoglobin Latest Ref Range: 13.2 - 17.1 g/dL 15.0  HCT Latest Ref Range: 38.5 - 50.0 % 44.9  MCV  Latest Ref Range: 80.0 - 100.0 fL 93.5  MCH Latest Ref Range: 27.0 - 33.0 pg 31.3  MCHC Latest Ref Range: 32.0 - 36.0 g/dL 33.4  RDW Latest Ref Range: 11.0 - 15.0 % 13.1  Platelets Latest Ref Range: 140 - 400 Thousand/uL 243  MPV Latest Ref Range: 7.5 - 12.5 fL 12.8 (H)   Results for James Branch, James Branch (MRN [id number]) as of 02/20/2019 10:26  Ref. Range 08/30/2017 10:41 12/12/2017 08:02 01/31/2019 09:00  Total CHOL/HDL Ratio Latest Ref Range: <5.0 (calc) 3.7 2.7  2.8  Cholesterol Latest Ref Range: <200 mg/dL 193 126 127  HDL Cholesterol Latest Ref Range: > OR = 40 mg/dL 52 47 46  LDL Cholesterol (Calc) Latest Units: mg/dL (calc) 122 (H) 64 63  Non-HDL Cholesterol (Calc) Latest Ref Range: <130 mg/dL (calc) 141 (H) 79 81  Triglycerides Latest Ref Range: <150 mg/dL 91 70 93    Review of Systems  Constitution: Negative for decreased appetite, malaise/fatigue, weight gain and weight loss.  HENT: Negative for congestion.   Eyes: Negative for visual disturbance.  Cardiovascular: Negative for chest pain, dyspnea on exertion, leg swelling, palpitations and syncope.  Respiratory: Negative for cough.   Endocrine: Negative for cold intolerance.  Hematologic/Lymphatic: Does not bruise/bleed easily.  Skin: Negative for itching and rash.  Musculoskeletal: Negative for myalgias.  Gastrointestinal: Negative for abdominal pain, nausea and vomiting.  Genitourinary: Negative for dysuria.  Neurological: Negative for dizziness and weakness.  Psychiatric/Behavioral: The patient is not nervous/anxious.   All other systems reviewed and are negative.        Vitals:   02/20/19 1335  BP: 140/74  Pulse: 86  Temp: 98.7 F (37.1 C)  SpO2: 97%     Body mass index is 49.07 kg/m. Filed Weights   02/20/19 1335  Weight: (!) 155.1 kg     Objective:   Physical Exam  Constitutional: He is oriented to person, place, and time. He appears well-developed and well-nourished. No distress.  Morbidly obese   HENT:  Head: Normocephalic and atraumatic.  Eyes: Pupils are equal, round, and reactive to light. Conjunctivae are normal.  Neck: No JVD present.  Cardiovascular: Normal rate, regular James and intact distal pulses.  Pulmonary/Chest: Effort normal and breath sounds normal. He has no wheezes. He has no rales.  Abdominal: Soft. Bowel sounds are normal. There is no rebound.  Musculoskeletal:        General: No edema.  Lymphadenopathy:    He has no cervical adenopathy.  Neurological: He is alert and oriented to person, place, and time. No cranial nerve deficit.  Skin: Skin is warm and dry.  Psychiatric: He has a normal mood and affect.  Nursing note and vitals reviewed.         Assessment & Recommendations:   63 y.o. Caucasian male with controlled hypertension, hyperlipidemia, tobacco abuse, morbid obesity, venous insufficiency, referred for evaluation of coronary artery calcification.  Coronary artery calcification: He does have left main and three vessel atherosclerosis, seen on lung cancer CT scan. However, he is completely asymptomatic with walking 25 min on treadmill, without any angina or angina equivalent. At this time, I do not recommend stress testing. Continue aggressive risk factor modification with Aspirin 81 mg, lipitor 40 mg (excellent improvement in LDL with this). Continue lisinopril 20 mg daily for hypertension.  If patient develops any symptoms of angina or angina equivalent, recommend exercise nuclear stress test at that time.  Discussed diet and lifestyle modification, especially in order to lose weight.    Tobacco cessation counseling:  - Currently smoking 1-2 cigarettes/day   - Patient was informed of the dangers of tobacco abuse including stroke, cancer, and MI, as well as benefits of tobacco cessation. - Patient is willing to quit at this time. - Approximately 5 mins were spent counseling patient cessation techniques. Patient is down to 1-2 cigarettes from 1  pack/day and is hoping to quit on his own in next fee days. - I will reassess his progress at the next follow-up visit.  F/u in 6 months.  [name], MD [facility] Pager: [phone] Office: [phone]

## 2019-02-22 ENCOUNTER — Other Ambulatory Visit: Payer: Self-pay | Admitting: Family Medicine

## 2019-02-24 ENCOUNTER — Other Ambulatory Visit: Payer: Self-pay

## 2019-02-24 ENCOUNTER — Ambulatory Visit (AMBULATORY_SURGERY_CENTER): Payer: Medicare Other | Admitting: *Deleted

## 2019-02-24 ENCOUNTER — Encounter: Payer: Self-pay | Admitting: Gastroenterology

## 2019-02-24 VITALS — Temp 97.1°F | Ht 70.0 in | Wt 345.2 lb

## 2019-02-24 DIAGNOSIS — Z1211 Encounter for screening for malignant neoplasm of colon: Secondary | ICD-10-CM

## 2019-02-24 DIAGNOSIS — Z1159 Encounter for screening for other viral diseases: Secondary | ICD-10-CM

## 2019-02-24 MED ORDER — SUPREP BOWEL PREP KIT 17.5-3.13-1.6 GM/177ML PO SOLN: 1.0000 | Freq: Once | ORAL | 0 refills | Status: AC

## 2019-02-24 NOTE — Progress Notes (Signed)
No egg or soy allergy known to patient  No issues with past sedation with any surgeries  or procedures, no intubation problems  No diet pills per patient No home 02 use per patient  No blood thinners per patient  Pt denies issues with constipation  No A fib or A flutter  EMMI video sent to pt's e mail  COv test 11-13 830 am [facility]   Due to the COVID-19 pandemic we are asking patients to follow these guidelines. Please only bring one care partner. Please be aware that your care partner may wait in the car in the parking lot or if they feel like they will be too hot to wait in the car, they may wait in the lobby on the 4th floor. All care partners are required to wear a mask the entire time (we do not have any that we can provide them), they need to practice social distancing, and we will do a Covid check for all patient's and care partners when you arrive. Also we will check their temperature and your temperature. If the care partner waits in their car they need to stay in the parking lot the entire time and we will call them on their cell phone when the patient is ready for discharge so they can bring the car to the front of the building. Also all patient's will need to wear a mask into building.

## 2019-02-28 ENCOUNTER — Other Ambulatory Visit: Payer: Self-pay

## 2019-02-28 ENCOUNTER — Other Ambulatory Visit (HOSPITAL_COMMUNITY)
Admission: RE | Admit: 2019-02-28 | Discharge: 2019-02-28 | Disposition: A | Discharge status: Home / Self Care | Payer: Medicare Other | Source: Ambulatory Visit | Attending: Gastroenterology | Admitting: Gastroenterology

## 2019-02-28 DIAGNOSIS — Z20828 Contact with and (suspected) exposure to other viral communicable diseases: Secondary | ICD-10-CM | POA: Diagnosis not present

## 2019-02-28 DIAGNOSIS — Z01812 Encounter for preprocedural laboratory examination: Secondary | ICD-10-CM | POA: Diagnosis not present

## 2019-02-28 LAB — SARS CORONAVIRUS 2 (TAT 6-24 HRS): SARS Coronavirus 2: NEGATIVE

## 2019-03-02 DIAGNOSIS — G4733 Obstructive sleep apnea (adult) (pediatric): Secondary | ICD-10-CM | POA: Diagnosis not present

## 2019-03-05 ENCOUNTER — Encounter: Payer: Self-pay | Admitting: Gastroenterology

## 2019-03-05 ENCOUNTER — Ambulatory Visit (AMBULATORY_SURGERY_CENTER): Payer: Medicare Other | Admitting: Gastroenterology

## 2019-03-05 ENCOUNTER — Other Ambulatory Visit: Payer: Self-pay

## 2019-03-05 VITALS — BP 116/67 | HR 70 | Temp 98.6°F | Resp 23 | Ht 70.0 in | Wt 345.0 lb

## 2019-03-05 DIAGNOSIS — Z1211 Encounter for screening for malignant neoplasm of colon: Secondary | ICD-10-CM

## 2019-03-05 DIAGNOSIS — D125 Benign neoplasm of sigmoid colon: Secondary | ICD-10-CM | POA: Diagnosis not present

## 2019-03-05 DIAGNOSIS — D123 Benign neoplasm of transverse colon: Secondary | ICD-10-CM

## 2019-03-05 DIAGNOSIS — D124 Benign neoplasm of descending colon: Secondary | ICD-10-CM | POA: Diagnosis not present

## 2019-03-05 MED ORDER — SODIUM CHLORIDE 0.9 % IV SOLN: 500.0000 mL | INTRAVENOUS | Status: DC

## 2019-03-05 NOTE — Progress Notes (Signed)
To PACU< VSS. Report to [email or web address] 

## 2019-03-05 NOTE — Progress Notes (Signed)
Temp JB  v/s CW I have reviewed the patient's medical history in detail and updated the computerized patient record. 

## 2019-03-05 NOTE — Patient Instructions (Signed)
Discharge instructions given. Handouts on polyps,diverticulosis and hemorrhoids. Resume previous medications. YOU HAD AN ENDOSCOPIC PROCEDURE TODAY AT THE [facility]:   Refer to the procedure report that was given to you for any specific questions about what was found during the examination.  If the procedure report does not answer your questions, please call your gastroenterologist to clarify.  If you requested that your care partner not be given the details of your procedure findings, then the procedure report has been included in a sealed envelope for you to review at your convenience later.  YOU SHOULD EXPECT: Some feelings of bloating in the abdomen. Passage of more gas than usual.  Walking can help get rid of the air that was put into your GI tract during the procedure and reduce the bloating. If you had a lower endoscopy (such as a colonoscopy or flexible sigmoidoscopy) you may notice spotting of blood in your stool or on the toilet paper. If you underwent a bowel prep for your procedure, you may not have a normal bowel movement for a few days.  Please Note:  You might notice some irritation and congestion in your nose or some drainage.  This is from the oxygen used during your procedure.  There is no need for concern and it should clear up in a day or so.  SYMPTOMS TO REPORT IMMEDIATELY:   Following lower endoscopy (colonoscopy or flexible sigmoidoscopy):  Excessive amounts of blood in the stool  Significant tenderness or worsening of abdominal pains  Swelling of the abdomen that is new, acute  Fever of 100F or higher  For urgent or emergent issues, a gastroenterologist can be reached at any hour by calling [phone].   DIET:  We do recommend a small meal at first, but then you may proceed to your regular diet.  Drink plenty of fluids but you should avoid alcoholic beverages for 24 hours.  ACTIVITY:  You should plan to take it easy for the rest of today and you  should NOT DRIVE or use heavy machinery until tomorrow (because of the sedation medicines used during the test).    FOLLOW UP: Our staff will call the number listed on your records 48-72 hours following your procedure to check on you and address any questions or concerns that you may have regarding the information given to you following your procedure. If we do not reach you, we will leave a message.  We will attempt to reach you two times.  During this call, we will ask if you have developed any symptoms of COVID 19. If you develop any symptoms (ie: fever, flu-like symptoms, shortness of breath, cough etc.) before then, please call [phone].  If you test positive for Covid 19 in the 2 weeks post procedure, please call and report this information to us.    If any biopsies were taken you will be contacted by phone or by letter within the next 1-3 weeks.  Please call us at [phone] if you have not heard about the biopsies in 3 weeks.    SIGNATURES/CONFIDENTIALITY: You and/or your care partner have signed paperwork which will be entered into your electronic medical record.  These signatures attest to the fact that that the information above on your After Visit Summary has been reviewed and is understood.  Full responsibility of the confidentiality of this discharge information lies with you and/or your care-partner. 

## 2019-03-05 NOTE — Op Note (Signed)
[place] Patient Name: James Branch Procedure Date: 03/05/2019 9:45 AM MRN: [id number] Endoscopist: Ladene Artist , MD Age: 63 Referring MD:  Date of Birth: [birth year] Gender: Male Account #: [id number] Procedure:                Colonoscopy Indications:              Screening for colorectal malignant neoplasm Medicines:                Monitored Anesthesia Care Procedure:                Pre-Anesthesia Assessment:                           - Prior to the procedure, a History and Physical                            was performed, and patient medications and                            allergies were reviewed. The patient's tolerance of                            previous anesthesia was also reviewed. The risks                            and benefits of the procedure and the sedation                            options and risks were discussed with the patient.                            All questions were answered, and informed consent                            was obtained. Prior Anticoagulants: The patient has                            taken no previous anticoagulant or antiplatelet                            agents. ASA Grade Assessment: III - A patient with                            severe systemic disease. After reviewing the risks                            and benefits, the patient was deemed in                            satisfactory condition to undergo the procedure.                           After obtaining informed consent, the colonoscope  was passed under direct vision. Throughout the                            procedure, the patient's blood pressure, pulse, and                            oxygen saturations were monitored continuously. The                            Colonoscope was introduced through the anus and                            advanced to the the cecum, identified by                            appendiceal orifice and  ileocecal valve. The                            ileocecal valve, appendiceal orifice, and rectum                            were photographed. The quality of the bowel                            preparation was good. The colonoscopy was performed                            without difficulty. The patient tolerated the                            procedure well. Scope In: [id number] AM Scope Out: 10:03:47 AM Scope Withdrawal Time: 0 hours 14 minutes 7 seconds  Total Procedure Duration: 0 hours 16 minutes 50 seconds  Findings:                 The perianal and digital rectal examinations were                            normal.                           Three sessile polyps were found in the sigmoid                            colon, descending colon and transverse colon. The                            polyps were 6 to 7 mm in size. These polyps were                            removed with a cold snare. Resection and retrieval                            were complete.  A single medium-sized localized angiodysplastic                            lesion without bleeding was found in the cecum.                           Multiple medium-mouthed diverticula were found in                            the left colon. There was no evidence of                            diverticular bleeding.                           Internal hemorrhoids were found during                            retroflexion. The hemorrhoids were small and Grade                            I (internal hemorrhoids that do not prolapse).                           The exam was otherwise without abnormality on                            direct and retroflexion views. Complications:            No immediate complications. Estimated blood loss:                            None. Estimated Blood Loss:     Estimated blood loss: none. Impression:               - Three 6 to 7 mm polyps in the sigmoid colon, in                             the descending colon and in the transverse colon,                            removed with a cold snare. Resected and retrieved.                           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                           - Moderate diverticulosis in the left colon.                           - Internal hemorrhoids.                           - The examination was otherwise normal on direct  and retroflexion views. Recommendation:           - Repeat colonoscopy after studies are complete for                            surveillance based on pathology results.                           - Patient has a contact number available for                            emergencies. The signs and symptoms of potential                            delayed complications were discussed with the                            patient. Return to normal activities tomorrow.                            Written discharge instructions were provided to the                            patient.                           - High fiber diet.                           - Continue present medications.                           - Await pathology results. Ladene Artist, MD 03/05/2019 10:07:52 AM This report has been signed electronically.

## 2019-03-05 NOTE — Progress Notes (Signed)
Called to room to assist during endoscopic procedure.  Patient ID and intended procedure confirmed with present staff. Received instructions for my participation in the procedure from the performing physician.  

## 2019-03-07 ENCOUNTER — Telehealth: Payer: Self-pay

## 2019-03-07 ENCOUNTER — Encounter: Payer: Self-pay | Admitting: Gastroenterology

## 2019-03-07 NOTE — Telephone Encounter (Signed)
  Follow up Call-  Call back number 03/05/2019  Post procedure Call Back phone  # [phone]  Permission to leave phone message Yes  Some recent data might be hidden     Patient questions:  Do you have a fever, pain , or abdominal swelling? No. Pain Score  0 *  Have you tolerated food without any problems? Yes.    Have you been able to return to your normal activities? Yes.    Do you have any questions about your discharge instructions: Diet   No. Medications  No. Follow up visit  No.  Do you have questions or concerns about your Care? No.  Actions: * If pain score is 4 or above: No action needed, pain <4.  1. Have you developed a fever since your procedure? no  2.   Have you had an respiratory symptoms (SOB or cough) since your procedure? no  3.   Have you tested positive for COVID 19 since your procedure no  4.   Have you had any family members/close contacts diagnosed with the COVID 19 since your procedure?  no   If yes to any of these questions please route to Joylene John, RN and Alphonsa Gin, Therapist, sports.

## 2019-03-27 ENCOUNTER — Encounter: Payer: Self-pay | Admitting: Family Medicine

## 2019-04-01 DIAGNOSIS — G4733 Obstructive sleep apnea (adult) (pediatric): Secondary | ICD-10-CM | POA: Diagnosis not present

## 2019-04-04 ENCOUNTER — Other Ambulatory Visit: Payer: Self-pay | Admitting: Family Medicine

## 2019-04-09 ENCOUNTER — Encounter: Payer: Self-pay | Admitting: Family Medicine

## 2019-04-09 MED ORDER — BETAMETHASONE DIPROPIONATE 0.05 % EX CREA: TOPICAL_CREAM | Freq: Two times a day (BID) | CUTANEOUS | 3 refills | Status: DC

## 2019-04-09 NOTE — Telephone Encounter (Signed)
OK to refill this cream - we have never have this to him.?

## 2019-05-02 DIAGNOSIS — G4733 Obstructive sleep apnea (adult) (pediatric): Secondary | ICD-10-CM | POA: Diagnosis not present

## 2019-05-15 ENCOUNTER — Ambulatory Visit: Payer: Medicare Other

## 2019-05-20 ENCOUNTER — Ambulatory Visit: Payer: Medicare Other

## 2019-05-23 ENCOUNTER — Ambulatory Visit: Payer: Medicare Other | Attending: Internal Medicine

## 2019-05-23 DIAGNOSIS — Z23 Encounter for immunization: Secondary | ICD-10-CM | POA: Insufficient documentation

## 2019-05-23 NOTE — Progress Notes (Signed)
   Covid-19 Vaccination Clinic  Name:  [facility] [place]    MRN: [id number] DOB: 23-Apr-1955  05/23/2019  [name] was observed post Covid-19 immunization for 15 minutes without incidence. He was provided with Vaccine Information Sheet and instruction to access the V-Safe system.   [name] was instructed to call 911 with any severe reactions post vaccine: Marland Kitchen Difficulty breathing  . Swelling of your face and throat  . A fast heartbeat  . A bad rash all over your body  . Dizziness and weakness    Immunizations Administered    Name Date Dose VIS Date Route   Pfizer COVID-19 Vaccine 05/23/2019  4:49 PM 0.3 mL 03/28/2019 Intramuscular   Manufacturer: [place]   Lot: [id number]   [place]: [id number]

## 2019-06-02 DIAGNOSIS — G4733 Obstructive sleep apnea (adult) (pediatric): Secondary | ICD-10-CM | POA: Diagnosis not present

## 2019-06-17 ENCOUNTER — Ambulatory Visit: Payer: Medicare Other | Attending: Internal Medicine

## 2019-06-17 DIAGNOSIS — Z23 Encounter for immunization: Secondary | ICD-10-CM | POA: Insufficient documentation

## 2019-06-17 NOTE — Progress Notes (Signed)
   Covid-19 Vaccination Clinic  Name:  James Branch    MRN: [id number] DOB: 09/30/1955  06/17/2019  James Branch was observed post Covid-19 immunization for 15 minutes without incident. He was provided with Vaccine Information Sheet and instruction to access the V-Safe system.   James Branch was instructed to call 911 with any severe reactions post vaccine: Marland Kitchen Difficulty breathing  . Swelling of face and throat  . A fast heartbeat  . A bad rash all over body  . Dizziness and weakness   Immunizations Administered    Name Date Dose VIS Date Route   Pfizer COVID-19 Vaccine 06/17/2019  4:14 PM 0.3 mL 03/28/2019 Intramuscular   Manufacturer: [place]   Lot: [id number]   [place]: [id number]

## 2019-06-30 DIAGNOSIS — G4733 Obstructive sleep apnea (adult) (pediatric): Secondary | ICD-10-CM | POA: Diagnosis not present

## 2019-07-10 ENCOUNTER — Other Ambulatory Visit: Payer: Self-pay | Admitting: Family Medicine

## 2019-07-15 DIAGNOSIS — G4733 Obstructive sleep apnea (adult) (pediatric): Secondary | ICD-10-CM | POA: Diagnosis not present

## 2019-07-31 DIAGNOSIS — G4733 Obstructive sleep apnea (adult) (pediatric): Secondary | ICD-10-CM | POA: Diagnosis not present

## 2019-08-19 ENCOUNTER — Other Ambulatory Visit: Payer: Self-pay

## 2019-08-19 ENCOUNTER — Ambulatory Visit (INDEPENDENT_AMBULATORY_CARE_PROVIDER_SITE_OTHER): Payer: Medicare Other | Admitting: Family Medicine

## 2019-08-19 ENCOUNTER — Encounter: Payer: Self-pay | Admitting: Family Medicine

## 2019-08-19 VITALS — BP 130/70 | HR 74 | Temp 97.4°F | Resp 20 | Ht 70.0 in | Wt 342.0 lb

## 2019-08-19 DIAGNOSIS — M5432 Sciatica, left side: Secondary | ICD-10-CM | POA: Diagnosis not present

## 2019-08-19 MED ORDER — PREDNISONE 20 MG PO TABS: ORAL_TABLET | ORAL | 0 refills | Status: DC

## 2019-08-19 MED ORDER — TIZANIDINE HCL 4 MG PO TABS
4.0000 mg | ORAL_TABLET | Freq: Four times a day (QID) | ORAL | 0 refills | Status: DC | PRN
Start: 2019-08-19 — End: 2019-09-05

## 2019-08-19 NOTE — Progress Notes (Signed)
Subjective:    Patient ID: James Branch, male    DOB: [birth year], 64 y.o.   MRN: [id number]  HPI  Patient reports a 1 week history of pain in his left lower back.  Over the last 2 or 3 days it started to radiate into his posterior left hip and the posterior lateral aspect of his left leg and into his left calf.  It is a deep aching pain.  He was triggered when he twisted to the right picking something up in his garage.  He denies any bowel or bladder incontinence.  He denies any saddle anesthesia.  He denies any leg weakness.  However the pain is intense and is not responding to Tylenol.  Past Medical History:  Diagnosis Date  . Arthritis    knees  . Asthma   . CAD (coronary artery disease)   . COPD (chronic obstructive pulmonary disease) ([place])   . Diastolic dysfunction   . GERD (gastroesophageal reflux disease)   . Hyperlipidemia   . Hypertension   . Obesity   . OSA on CPAP    ahi-84, 18 CWP  . Sleep apnea    wears cpap   . Smoker   . Umbilical hernia    Past Surgical History:  Procedure Laterality Date  . CARDIAC CATHETERIZATION    . COLONOSCOPY  2007  . TONSILLECTOMY  1967   Current Outpatient Medications on File Prior to Visit  Medication Sig Dispense Refill  . albuterol (VENTOLIN HFA) 108 (90 Base) MCG/ACT inhaler INHALE 2 PUFFS INTO THE LUNGS 4 (FOUR) TIMES DAILY. 8.5 g 11  . aspirin EC 81 MG tablet Take 81 mg by mouth daily.    Marland Kitchen atorvastatin (LIPITOR) 20 MG tablet Take 1 tablet (20 mg total) by mouth daily. 90 tablet 3  . cholecalciferol (VITAMIN D3) 25 MCG (1000 UT) tablet Take 1,000 Units by mouth daily.    . furosemide (LASIX) 40 MG tablet TAKE 1 TABLET (40 MG TOTAL) BY MOUTH DAILY. STOP HCTZ (Patient taking differently: Take 40 mg by mouth daily. ) 90 tablet 1  . lisinopril (ZESTRIL) 20 MG tablet Take 1 tablet (20 mg total) by mouth daily. 90 tablet 3  . diclofenac (VOLTAREN) 75 MG EC tablet TAKE 1 TABLET BY MOUTH TWICE A DAY (Patient not taking: Reported on  08/19/2019) 60 tablet 3   No current facility-administered medications on file prior to visit.   Allergies  Allergen Reactions  . Penicillins Swelling   Social History   Socioeconomic History  . Marital status: Married    Spouse name: Not on file  . Number of children: 0  . Years of education: Not on file  . Highest education level: Not on file  Occupational History  . Not on file  Tobacco Use  . Smoking status: Former Smoker    Types: Cigarettes    Quit date: 02/17/2019    Years since quitting: 0.5  . Smokeless tobacco: Never Used  Substance and Sexual Activity  . Alcohol use: No    Alcohol/week: 0.0 standard drinks  . Drug use: No  . Sexual activity: Yes    Birth control/protection: Condom  Other Topics Concern  . Not on file  Social History Narrative  . Not on file   Social Determinants of Health   Financial Resource Strain:   . Difficulty of Paying Living Expenses:   Food Insecurity:   . Worried About Charity fundraiser in the Last Year:   . [employer]  of Food in the Last Year:   Transportation Needs:   . Film/video editor (Medical):   Marland Kitchen Lack of Transportation (Non-Medical):   Physical Activity:   . Days of Exercise per Week:   . Minutes of Exercise per Session:   Stress:   . Feeling of Stress :   Social Connections:   . Frequency of Communication with Friends and Family:   . Frequency of Social Gatherings with Friends and Family:   . Attends Religious Services:   . Active Member of Clubs or Organizations:   . Attends Archivist Meetings:   Marland Kitchen Marital Status:   Intimate Partner Violence:   . Fear of Current or Ex-Partner:   . Emotionally Abused:   Marland Kitchen Physically Abused:   . Sexually Abused:       Review of Systems  Musculoskeletal: Positive for back pain.  All other systems reviewed and are negative.      Objective:   Physical Exam  Constitutional: He appears well-developed and well-nourished. No distress.  Cardiovascular: Normal  rate, regular rhythm and normal heart sounds.  Pulmonary/Chest: Effort normal and breath sounds normal. No respiratory distress. He has no wheezes. He has no rales.  Musculoskeletal:        General: Edema present.     Lumbar back: Pain and spasms present.       Back:  Skin: No rash noted. He is not diaphoretic.  Vitals reviewed.         Assessment & Plan:  Left sided sciatica  Patient saw no benefit from diclofenac or Tylenol.  Therefore we will try prednisone taper pack and use Zanaflex 4 mg every 6 hours as needed for muscle spasms.  Recheck in 1 week if no better or sooner if worsening.  At that point I would proceed with imaging of the back.

## 2019-08-21 ENCOUNTER — Ambulatory Visit: Payer: Medicare Other | Admitting: Cardiology

## 2019-08-22 ENCOUNTER — Encounter: Payer: Self-pay | Admitting: Family Medicine

## 2019-08-25 ENCOUNTER — Other Ambulatory Visit: Payer: Self-pay | Admitting: Family Medicine

## 2019-08-25 MED ORDER — OXYCODONE-ACETAMINOPHEN 7.5-325 MG PO TABS: 1.0000 | ORAL_TABLET | ORAL | 0 refills | Status: DC | PRN

## 2019-08-30 DIAGNOSIS — G4733 Obstructive sleep apnea (adult) (pediatric): Secondary | ICD-10-CM | POA: Diagnosis not present

## 2019-09-03 NOTE — Progress Notes (Signed)
Patient referred by James Frizzle, MD for coronary artery calcification.  Subjective:   James Branch, male    DOB: 1955/12/06, 64 y.o.   MRN: [id number]  Chief Complaint  Patient presents with  . Coronary Artery Disease  . Follow-up    6 mont    HPI  64 y.o. Caucasian male with controlled hypertension, hyperlipidemia, tobacco abuse, morbid obesity, venous insufficiency, referred for evaluation of coronary artery calcification.  Patient denies any chest pain, but has experienced dyspnea with exertion- such as during his recent visit to [place].   Current Outpatient Medications on File Prior to Visit  Medication Sig Dispense Refill  . albuterol (VENTOLIN HFA) 108 (90 Base) MCG/ACT inhaler INHALE 2 PUFFS INTO THE LUNGS 4 (FOUR) TIMES DAILY. 8.5 g 11  . aspirin EC 81 MG tablet Take 81 mg by mouth daily.    Marland Kitchen atorvastatin (LIPITOR) 20 MG tablet Take 1 tablet (20 mg total) by mouth daily. 90 tablet 3  . cholecalciferol (VITAMIN D3) 25 MCG (1000 UT) tablet Take 1,000 Units by mouth daily.    . furosemide (LASIX) 40 MG tablet TAKE 1 TABLET (40 MG TOTAL) BY MOUTH DAILY. STOP HCTZ (Patient taking differently: Take 40 mg by mouth daily. ) 90 tablet 1  . lisinopril (ZESTRIL) 20 MG tablet Take 1 tablet (20 mg total) by mouth daily. 90 tablet 3  . oxyCODONE-acetaminophen (PERCOCET) 7.5-325 MG tablet Take 1 tablet by mouth every 4 (four) hours as needed for severe pain. 30 tablet 0  . predniSONE (DELTASONE) 20 MG tablet 3 tabs poqday 1-2, 2 tabs poqday 3-4, 1 tab poqday 5-6 12 tablet 0  . tiZANidine (ZANAFLEX) 4 MG tablet Take 1 tablet (4 mg total) by mouth every 6 (six) hours as needed for muscle spasms. 30 tablet 0   No current facility-administered medications on file prior to visit.    Cardiovascular studies:  EKG 09/05/2019: Sinus rhythm 68 bpm Normal EKG   Ultrasound abdominal aorta 02/17/2019: Abdominal Aorta: No evidence of an abdominal aortic aneurysm was  visualized. Very limited exam and unable to do a thorough exam of the aorta and iliac arteries.  Stenosis: Very limited exam. From what was seen there is no evidence of stenosis.  Can not rule out stenosis on segments not visualized.  CT chest 02/11/2019: 1. Lung-RADS 2S, benign appearance or behavior. Continue annual screening with low-dose chest CT without contrast in 12 months. 2. The "S" modifier above refers to potentially clinically significant non lung cancer related findings.  Specifically, there is aortic atherosclerosis, in addition to left main and 3 vessel coronary artery disease. Please note that although the presence of coronary artery calcium  documents the presence of coronary artery disease, the severity of this disease and any potential stenosis cannot be assessed on this non-gated CT examination. Assessment for potential risk factor modification,  dietary therapy or pharmacologic therapy may be warranted, if clinically indicated. 3. Mild diffuse bronchial wall thickening with very mild centrilobular and paraseptal emphysema;  imaging findings suggestive of underlying COPD. 4. Hepatic steatosis.  Aortic Atherosclerosis and Emphysema   Recent labs: 01/31/2019: Glucose 106, BUN/Cr 25/1. EGFR 79. Na/K 141/4.4. Rest of the CMP normal H/H 15/44. MCV 93. Platelets 243 Chol 127, TG 93, HDL 46, LDL 63.   Review of Systems  Cardiovascular: Negative for chest pain, dyspnea on exertion, leg swelling, palpitations and syncope.         Vitals:   09/05/19 0935  BP: 135/65  Pulse: 72  Resp: 17  SpO2: 96%     Body mass index is 49.01 kg/m. Filed Weights   09/05/19 0935  Weight: (!) 341 lb 9.6 oz (154.9 kg)     Objective:   Physical Exam  Constitutional: No distress.  Morbidly obese  Neck: No JVD present.  Cardiovascular: Normal rate, regular rhythm, normal heart sounds and intact distal pulses.  No murmur heard. Pulmonary/Chest: Effort normal and breath  sounds normal. He has no wheezes. He has no rales.  Musculoskeletal:        General: No edema.  Nursing note and vitals reviewed.         Assessment & Recommendations:   64 y.o. Caucasian male with controlled hypertension, hyperlipidemia, tobacco abuse, morbid obesity, venous insufficiency, referred for evaluation of coronary artery calcification.  Exertional dyspnea: Will obtain exercise nuclear stress test. May need 2 day study.  Coronary artery calcification: Continue Aspirin, stain  Hypertension: Controlled  F/u in 6 months.   James Mormon, MD [facility] Pager: [phone] Office: [phone]

## 2019-09-05 ENCOUNTER — Ambulatory Visit: Payer: Medicare Other | Admitting: Cardiology

## 2019-09-05 ENCOUNTER — Encounter: Payer: Self-pay | Admitting: Cardiology

## 2019-09-05 ENCOUNTER — Other Ambulatory Visit: Payer: Self-pay

## 2019-09-05 VITALS — BP 135/65 | HR 72 | Resp 17 | Ht 70.0 in | Wt 341.6 lb

## 2019-09-05 DIAGNOSIS — I2510 Atherosclerotic heart disease of native coronary artery without angina pectoris: Secondary | ICD-10-CM

## 2019-09-05 DIAGNOSIS — R0600 Dyspnea, unspecified: Secondary | ICD-10-CM | POA: Insufficient documentation

## 2019-09-05 DIAGNOSIS — R0609 Other forms of dyspnea: Secondary | ICD-10-CM | POA: Diagnosis not present

## 2019-09-05 DIAGNOSIS — E782 Mixed hyperlipidemia: Secondary | ICD-10-CM | POA: Diagnosis not present

## 2019-09-08 ENCOUNTER — Other Ambulatory Visit: Payer: Self-pay

## 2019-09-08 ENCOUNTER — Ambulatory Visit: Payer: Medicare Other

## 2019-09-08 DIAGNOSIS — I2510 Atherosclerotic heart disease of native coronary artery without angina pectoris: Secondary | ICD-10-CM

## 2019-09-08 DIAGNOSIS — R0600 Dyspnea, unspecified: Secondary | ICD-10-CM

## 2019-09-08 DIAGNOSIS — R0609 Other forms of dyspnea: Secondary | ICD-10-CM

## 2019-09-17 MED ORDER — NITROGLYCERIN 0.4 MG SL SUBL: 0.4000 mg | SUBLINGUAL_TABLET | SUBLINGUAL | 2 refills | Status: DC | PRN

## 2019-09-17 MED ORDER — METOPROLOL TARTRATE 25 MG PO TABS: 25.0000 mg | ORAL_TABLET | Freq: Two times a day (BID) | ORAL | 2 refills | Status: DC

## 2019-09-17 NOTE — Progress Notes (Signed)
Lexiscan Tetrofosmin stress test 09/08/2019: Lexiscan/modified Bruce nuclear stress test performed using 1-day protocol. Stress EKG is non-diagnostic, as this is pharmacological stress test. In addition, stress EKG at 79% MPHR showed sinus tachycardia, <1 mm downsloping ST segment depression in inferior leads.  SPECT images show medium sized, medium intensity, reversible perfusion defect in mid to apical inferior myocardium. Stress LVEF 70%. Intermediate risk study.   Discussed these results with the patient. Recommend aspirin/statin, added metoprolol tartarate 25 mg bid, SL NTG as needed. If symptoms no better, will need cardiac catheterization.  Please schedule f/u w/me in 4 weeks.   [name], MD [facility] Pager: [phone] Office: [phone]

## 2019-09-23 ENCOUNTER — Other Ambulatory Visit: Payer: Self-pay | Admitting: Family Medicine

## 2019-10-15 ENCOUNTER — Other Ambulatory Visit: Payer: Self-pay

## 2019-10-15 ENCOUNTER — Ambulatory Visit: Payer: Medicare Other | Admitting: Cardiology

## 2019-10-15 ENCOUNTER — Encounter: Payer: Self-pay | Admitting: Cardiology

## 2019-10-15 VITALS — BP 126/59 | HR 61 | Ht 70.0 in | Wt 339.0 lb

## 2019-10-15 DIAGNOSIS — R9439 Abnormal result of other cardiovascular function study: Secondary | ICD-10-CM

## 2019-10-15 DIAGNOSIS — E782 Mixed hyperlipidemia: Secondary | ICD-10-CM | POA: Diagnosis not present

## 2019-10-15 DIAGNOSIS — I2510 Atherosclerotic heart disease of native coronary artery without angina pectoris: Secondary | ICD-10-CM

## 2019-10-15 MED ORDER — METOPROLOL TARTRATE 25 MG PO TABS: 25.0000 mg | ORAL_TABLET | Freq: Two times a day (BID) | ORAL | 2 refills | Status: DC

## 2019-10-15 NOTE — Progress Notes (Signed)
Patient referred by [name], MD for coronary artery calcification.  Subjective:   [place], male    DOB: 06-Mar-1956, 64 y.o.   MRN: [id number]   Chief Complaint  Patient presents with  . Coronary Artery Disease  . Follow-up    4 weekj    HPI  64 y.o. Caucasian male with controlled hypertension, hyperlipidemia, tobacco dependence, CAD with exertional dyspnea, abnormal stress test, morbid obesity, venous insufficiency  Patient denies any chest pain, but has experienced dyspnea with exertion- such as during his recent visit to [facility]. Stress test was intermediate risk, details below.  I discussed these results with the patient over the phone. And recommend aspirin/statin, added metoprolol tartarate 25 mg bid, SL NTG as needed. If symptoms no better, will need cardiac catheterization.  Patient is here for follow up visit.  Since last visit, he has lost 3 pounds.  With metoprolol tartrate 25 mg twice daily, his exertional dyspnea symptoms have improved.    Current Outpatient Medications on File Prior to Visit  Medication Sig Dispense Refill  . albuterol (VENTOLIN HFA) 108 (90 Base) MCG/ACT inhaler INHALE 2 PUFFS INTO THE LUNGS 4 (FOUR) TIMES DAILY. 8.5 g 11  . aspirin EC 81 MG tablet Take 81 mg by mouth daily.    Marland Kitchen atorvastatin (LIPITOR) 20 MG tablet Take 1 tablet (20 mg total) by mouth daily. 90 tablet 3  . cholecalciferol (VITAMIN D3) 25 MCG (1000 UT) tablet Take 1,000 Units by mouth daily.    . furosemide (LASIX) 40 MG tablet Take 1 tablet (40 mg total) by mouth daily. 90 tablet 0  . lisinopril (ZESTRIL) 20 MG tablet Take 1 tablet (20 mg total) by mouth daily. 90 tablet 3  . metoprolol tartrate (LOPRESSOR) 25 MG tablet Take 1 tablet (25 mg total) by mouth 2 (two) times daily. 60 tablet 2  . nitroGLYCERIN (NITROSTAT) 0.4 MG SL tablet Place 1 tablet (0.4 mg total) under the tongue every 5 (five) minutes as needed for chest pain. 30 tablet 2   No current  facility-administered medications on file prior to visit.    Cardiovascular studies:  Lexiscan Tetrofosmin stress test 09/08/2019: Lexiscan/modified Bruce nuclear stress test performed using 1-day protocol. Stress EKG is non-diagnostic, as this is pharmacological stress test. In addition, stress EKG at 79% MPHR showed sinus tachycardia, <1 mm downsloping ST segment depression in inferior leads.  SPECT images show medium sized, medium intensity, reversible perfusion defect in mid to apical inferior myocardium. STress LVEF 70%. Intermediate risk study.    EKG 09/05/2019: Sinus rhythm 68 bpm Normal EKG   Ultrasound abdominal aorta 02/17/2019: Abdominal Aorta: No evidence of an abdominal aortic aneurysm was visualized. Very limited exam and unable to do a thorough exam of the aorta and iliac arteries.  Stenosis: Very limited exam. From what was seen there is no evidence of stenosis.  Can not rule out stenosis on segments not visualized.  CT chest 02/11/2019: 1. Lung-RADS 2S, benign appearance or behavior. Continue annual screening with low-dose chest CT without contrast in 12 months. 2. The "S" modifier above refers to potentially clinically significant non lung cancer related findings.  Specifically, there is aortic atherosclerosis, in addition to left main and 3 vessel coronary artery disease. Please note that although the presence of coronary artery calcium  documents the presence of coronary artery disease, the severity of this disease and any potential stenosis cannot be assessed on this non-gated CT examination. Assessment for potential risk factor modification,  dietary therapy or pharmacologic therapy may be warranted, if clinically indicated. 3. Mild diffuse bronchial wall thickening with very mild centrilobular and paraseptal emphysema;  imaging findings suggestive of underlying COPD. 4. Hepatic steatosis.  Aortic Atherosclerosis and Emphysema   Recent  labs: 01/31/2019: Glucose 106, BUN/Cr 25/1. EGFR 79. Na/K 141/4.4. Rest of the CMP normal H/H 15/44. MCV 93. Platelets 243 Chol 127, TG 93, HDL 46, LDL 63.   Review of Systems  Cardiovascular: Negative for chest pain, dyspnea on exertion, leg swelling, palpitations and syncope.         Vitals:   10/15/19 1129 10/15/19 1131  BP: (!) 141/109 (!) 126/59  Pulse: 66 61  SpO2: 95%      Body mass index is 48.64 kg/m. Filed Weights   10/15/19 1129  Weight: (!) 339 lb (153.8 kg)     Objective:   Physical Exam Vitals and nursing note reviewed.  Constitutional:      General: He is not in acute distress.    Comments: Morbidly obese  Neck:     Vascular: No JVD.  Cardiovascular:     Rate and Rhythm: Normal rate and regular rhythm.     Pulses: Intact distal pulses.     Heart sounds: Normal heart sounds. No murmur heard.   Pulmonary:     Effort: Pulmonary effort is normal.     Breath sounds: Normal breath sounds. No wheezing or rales.           Assessment & Recommendations:   64 y.o. Caucasian male with controlled hypertension, hyperlipidemia, tobacco dependence, CAD with exertional dyspnea, abnormal stress test, morbid obesity, venous insufficiency  CAD: Seen on CT chest with left main and triple vessel atherosclerosis. Now with exertional dyspnea and abnormal stress test. Symptoms improved with medical management.  I discussed with the patient regarding continued medical management versus medical management plus invasion management with further angiography and possible revascularization.  Patient would like to continue medical management at this time.  He has an upcoming circumcision surgery.  If surgery is performed under local anesthesia, it should be low risk.  If it will be performed under general anesthesia, could consider coronary angiography for stratification.  Patient will let me know. Continue Aspirin, stain  Hypertension: Controlled  F/u in 2  months.   [name], MD [facility] Pager: [phone] Office: [phone]

## 2019-10-15 NOTE — Telephone Encounter (Signed)
Is the surgery going to be under general anesthesia?

## 2019-10-16 NOTE — Telephone Encounter (Signed)
If surgery is going to be under general anesthesia, recommend coronary angiography before the surgery. Keep me posted.  Regards, Dr. Virgina Jock

## 2019-10-21 DIAGNOSIS — G4733 Obstructive sleep apnea (adult) (pediatric): Secondary | ICD-10-CM | POA: Diagnosis not present

## 2019-11-04 ENCOUNTER — Encounter: Payer: Self-pay | Admitting: Family Medicine

## 2019-11-04 MED ORDER — LISINOPRIL 20 MG PO TABS: 20.0000 mg | ORAL_TABLET | Freq: Every day | ORAL | 3 refills | Status: DC

## 2019-11-19 ENCOUNTER — Telehealth: Payer: Self-pay | Admitting: Family Medicine

## 2019-11-19 NOTE — Progress Notes (Signed)
  Chronic Care Management   Outreach Note  11/19/2019 Name: HAMP MORELAND MRN: [id number] DOB: 10-05-1955  Referred by: [name], MD Reason for referral : No chief complaint on file.   An unsuccessful telephone outreach was attempted today. The patient was referred to the pharmacist for assistance with care management and care coordination.   Follow Up Plan:   Carley Perdue [employer] Scheduler

## 2019-11-23 ENCOUNTER — Other Ambulatory Visit: Payer: Self-pay | Admitting: Cardiology

## 2019-11-23 DIAGNOSIS — I2510 Atherosclerotic heart disease of native coronary artery without angina pectoris: Secondary | ICD-10-CM

## 2019-11-26 ENCOUNTER — Other Ambulatory Visit: Payer: Self-pay

## 2019-11-26 ENCOUNTER — Telehealth: Payer: Self-pay | Admitting: Family Medicine

## 2019-11-26 NOTE — Progress Notes (Signed)
°  Chronic Care Management   Note  11/26/2019 Name: [facility] [place] MRN: [id number] DOB: [birth year]  James [place] is a 65 y.o. year old male who is a primary care patient of Dennard Schaumann, [name], MD. I reached out to [name] by phone today in response to a referral sent by [facility] [place]'s PCP, [name], MD.   [facility] [place] was given information about Chronic Care Management services today including:  1. CCM service includes personalized support from designated clinical staff supervised by his physician, including individualized plan of care and coordination with other care providers 2. 24/7 contact phone numbers for assistance for urgent and routine care needs. 3. Service will only be billed when office clinical staff spend 20 minutes or more in a month to coordinate care. 4. Only one practitioner may furnish and bill the service in a calendar month. 5. The patient may stop CCM services at any time (effective at the end of the month) by phone call to the office staff.   Patient agreed to services and verbal consent obtained.   Follow up plan:   Carley Perdue [employer] Scheduler

## 2019-12-16 ENCOUNTER — Other Ambulatory Visit: Payer: Self-pay | Admitting: Family Medicine

## 2019-12-17 ENCOUNTER — Other Ambulatory Visit: Payer: Self-pay | Admitting: Family Medicine

## 2019-12-18 ENCOUNTER — Other Ambulatory Visit: Payer: Self-pay | Admitting: *Deleted

## 2019-12-18 MED ORDER — ATORVASTATIN CALCIUM 20 MG PO TABS
20.0000 mg | ORAL_TABLET | Freq: Every day | ORAL | 3 refills | Status: DC
Start: 2019-12-18 — End: 2020-01-21

## 2020-01-16 ENCOUNTER — Other Ambulatory Visit: Payer: Self-pay | Admitting: Family Medicine

## 2020-01-19 NOTE — Chronic Care Management (AMB) (Addendum)
Chronic Care Management Pharmacy  Name: James Branch  MRN: [id number] DOB: 22-May-1955  Chief Complaint/ HPI  James Branch,  64 y.o. , male presents for their Initial CCM visit with the clinical pharmacist via telephone.  PCP : James Frizzle, MD  Their chronic conditions include: CAD, HLD, Diastolic dysfunction.  Office Visits: 08/19/2019 James Branch) -  One week history of lower back pain, not responding to Tylenol Trial of prednisone and Zanaflex for, will recheck in one week  Consult Visit: 09/05/2019 (James Branch, Cardio)  Patient has experience dyspnea on exertion, stress test intermediate risk  Medications: Outpatient Encounter Medications as of 01/21/2020  Medication Sig   albuterol (VENTOLIN HFA) 108 (90 Base) MCG/ACT inhaler INHALE 2 PUFFS INTO THE LUNGS 4 (FOUR) TIMES DAILY.   aspirin EC 81 MG tablet Take 81 mg by mouth daily.   atorvastatin (LIPITOR) 20 MG tablet Take 1 tablet (20 mg total) by mouth daily.   betamethasone dipropionate 0.05 % cream APPLY TO AFFECTED AREA TWICE A DAY   cholecalciferol (VITAMIN D3) 25 MCG (1000 UT) tablet Take 1,000 Units by mouth daily.   furosemide (LASIX) 40 MG tablet TAKE 1 TABLET BY MOUTH EVERY DAY   lisinopril (ZESTRIL) 20 MG tablet Take 1 tablet (20 mg total) by mouth daily.   metoprolol tartrate (LOPRESSOR) 25 MG tablet Take 1 tablet (25 mg total) by mouth 2 (two) times daily.   nitroGLYCERIN (NITROSTAT) 0.4 MG SL tablet PLACE 1 TABLET (0.4 MG TOTAL) UNDER THE TONGUE EVERY 5 (FIVE) MINUTES AS NEEDED FOR CHEST PAIN.   No facility-administered encounter medications on file as of 01/21/2020.     Current Diagnosis/Assessment:   James Branch: Low Risk    Difficulty of Paying Living Expenses: Not very hard    Goals Addressed             This Visit's Progress    Pharmacy Care Plan:       CARE PLAN ENTRY (see longitudinal plan of care for additional care plan information)  Current Barriers:  Chronic  Disease Management support, education, and care coordination needs related to Hyperlipidemia and CAD, and diastolic dysfunction.    Hyperlipidemia Lab Results  Component Value Date/Time   LDLCALC 63 01/31/2019 09:00 AM   Pharmacist Clinical Goal(s): Over the next 180 days, patient will work with PharmD and providers to maintain LDL goal < 70 Current regimen:  Atorvastatin 49m Interventions: Reviewed most recent lipid panel Discussed current diet and exercise regimen Patient self care activities - Over the next 180 days, patient will: Continue to focus on medication adherence using pill box   CAD Pharmacist Clinical Goal(s) Over the next 180 days, patient will work with PharmD and providers to optimize medication and minimize symptoms of CAD. Current regimen:  ASA 835mInterventions: Discussed need to control risk factors Counseled on symptoms of abnormal bleeding Patient self care activities - Over the next 180 days, patient will: Continue to focus on medication adherence Contact providers with any new or worsening symptoms  Disatolic Dysfunction Pharmacist Clinical Goal(s) Over the next 180 days, patient will work with PharmD and providers to optimize medication related to diastolic dysfunction. Current regimen:  Metoprolol tartrate 2568mid Furosemide 23m81mily Lisinopril 20mg25merventions: Comprehensive medication review Discussed current fluid status Patient self care activities - Over the next 180 days, patient will: Continue to monitor fluid status by weight Contact providers with any new or worsening symptoms Initial goal documentation  Hyperlipidemia   LDL goal < 70  Lipid Panel     Component Value Date/Time   CHOL 127 01/31/2019 0900   TRIG 93 01/31/2019 0900   HDL 46 01/31/2019 0900   LDLCALC 63 01/31/2019 0900    Hepatic Function Latest Ref Rng & Units 01/31/2019 08/30/2017 03/28/2016  Total Protein 6.1 - 8.1 g/dL 6.7 7.1 6.6    Albumin 3.6 - 5.1 g/dL - - 4.1  AST 10 - 35 U/L '22 17 16  ' ALT 9 - 46 U/L 35 19 18  Alk Phosphatase 40 - 115 U/L - - 66  Total Bilirubin 0.2 - 1.2 mg/dL 0.7 0.7 0.5     The ASCVD Risk score (Canyon Day., et al., 2013) failed to calculate for the following reasons:   The valid total cholesterol range is 130 to 320 mg/dL   Patient has failed these meds in past: none noted Patient is currently controlled on the following medications:  Atorvastatin 24m  LDL well controlled last October.  Due for repeat lipid panel.  They have upcoming appointment end of this month, recommend repeat labs.  He is taking medication appropriately and reports adherence.  Plan  Continue current medications, follow up on lipid panel end of October. CAD   Patient has failed these meds in past: none noted Patient is currently controlled on the following medications:  ASA 849m Controlling risk factors, denies abnormal bleeding or bruising.  Takes daily.  Plan  Continue current medications Diastolic Dysfunction   Patient has failed these meds in past: none noted Patient is currently controlled on the following medications:  Metoprolol tartrate 2524mid Furosemide 20m20mily Lisinopril 20mg55mnies swelling, reports BP is well controlled and denies dizziness and headaches.  No concerns at this time.  Plan  Continue current medications   Vaccines   Reviewed and discussed patient's vaccination history.    Immunization History  Administered Date(s) Administered   Influenza,inj,Quad PF,6+ Mos 03/14/2017, 03/04/2018, 01/31/2019   Influenza-Unspecified 02/28/2016, 02/24/2017   PFIZER SARS-COV-2 Vaccination 05/23/2019, 06/17/2019   Pneumococcal Polysaccharide-23 01/31/2019   Tdap 10/26/2014   Zoster 03/28/2016    Plan  Recommended patient receive COVID booster, flu vaccine in.  Medication Management   Miscellaneous medications:  Albuterol HFA 90mcg1m Nitroglycerin 0.4mg OT73m:   Tylenol  Nexium Patient currently uses [facility] pharmacy.  Phone #  [phone] reports using pill box method to organize medications and promote adherence. Patient denies missed doses of medication.  Verbal consent obtained for [facility] enhanced pharmacy services (medication synchronization, adherence packaging, delivery coordination). A medication sync plan was created to allow patient to get all medications delivered once every 30 to 90 days per patient preference. Patient understands they have freedom to choose pharmacy and clinical pharmacist will coordinate care between all prescribers and [facility].     ChristiBeverly MilchD Clinical Pharmacist [phone] collaborated with the care management provider regarding care management and care coordination activities outlined in this encounter and have reviewed this encounter including documentation in the note and care plan. I am certifying that I agree with the content of this note and encounter as supervising physician.

## 2020-01-21 ENCOUNTER — Other Ambulatory Visit: Payer: Self-pay

## 2020-01-21 ENCOUNTER — Ambulatory Visit: Payer: Medicare Other | Admitting: Pharmacist

## 2020-01-21 DIAGNOSIS — I2510 Atherosclerotic heart disease of native coronary artery without angina pectoris: Secondary | ICD-10-CM

## 2020-01-21 DIAGNOSIS — E782 Mixed hyperlipidemia: Secondary | ICD-10-CM

## 2020-01-21 DIAGNOSIS — I5189 Other ill-defined heart diseases: Secondary | ICD-10-CM

## 2020-01-21 MED ORDER — FUROSEMIDE 40 MG PO TABS
40.0000 mg | ORAL_TABLET | Freq: Every day | ORAL | 3 refills | Status: DC
Start: 2020-01-21 — End: 2020-03-17

## 2020-01-21 MED ORDER — ALBUTEROL SULFATE HFA 108 (90 BASE) MCG/ACT IN AERS
INHALATION_SPRAY | RESPIRATORY_TRACT | 11 refills | Status: DC
Start: 2020-01-21 — End: 2021-02-23

## 2020-01-21 MED ORDER — LISINOPRIL 20 MG PO TABS
20.0000 mg | ORAL_TABLET | Freq: Every day | ORAL | 3 refills | Status: DC
Start: 2020-01-21 — End: 2020-11-22

## 2020-01-21 MED ORDER — ATORVASTATIN CALCIUM 20 MG PO TABS
20.0000 mg | ORAL_TABLET | Freq: Every day | ORAL | 3 refills | Status: DC
Start: 2020-01-21 — End: 2021-02-18

## 2020-01-21 NOTE — Patient Instructions (Addendum)
Visit Information Thank you for meeting with me today!  I look forward to working with you to help you meet all of your healthcare goals and answer any questions you may have.  Feel free to contact me anytime!  Goals Addressed            This Visit's Progress   . Pharmacy Care Plan:       CARE PLAN ENTRY (see longitudinal plan of care for additional care plan information)  Current Barriers:  . Chronic Disease Management support, education, and care coordination needs related to Hyperlipidemia and CAD, and diastolic dysfunction.    Hyperlipidemia Lab Results  Component Value Date/Time   LDLCALC 63 01/31/2019 09:00 AM   . Pharmacist Clinical Goal(s): o Over the next 180 days, patient will work with PharmD and providers to maintain LDL goal < 70 . Current regimen:  o Atorvastatin 20mg  . Interventions: o Reviewed most recent lipid panel o Discussed current diet and exercise regimen . Patient self care activities - Over the next 180 days, patient will: o Continue to focus on medication adherence using pill box   CAD . Pharmacist Clinical Goal(s) o Over the next 180 days, patient will work with PharmD and providers to optimize medication and minimize symptoms of CAD. Marland Kitchen Current regimen:  o ASA 81mg  . Interventions: o Discussed need to control risk factors o Counseled on symptoms of abnormal bleeding . Patient self care activities - Over the next 180 days, patient will: o Continue to focus on medication adherence o Contact providers with any new or worsening symptoms  Disatolic Dysfunction . Pharmacist Clinical Goal(s) o Over the next 180 days, patient will work with PharmD and providers to optimize medication related to diastolic dysfunction. . Current regimen:  . Metoprolol tartrate 25mg  bid . Furosemide 40mg  daily . Lisinopril 20mg  . Interventions: o Comprehensive medication review o Discussed current fluid status . Patient self care activities - Over the next 180  days, patient will: o Continue to monitor fluid status by weight o Contact providers with any new or worsening symptoms Initial goal documentation        James Branch was given information about Chronic Care Management services today including:  1. CCM service includes personalized support from designated clinical staff supervised by his physician, including individualized plan of care and coordination with other care providers 2. 24/7 contact phone numbers for assistance for urgent and routine care needs. 3. Standard insurance, coinsurance, copays and deductibles apply for chronic care management only during months in which we provide at least 20 minutes of these services. Most insurances cover these services at 100%, however patients may be responsible for any copay, coinsurance and/or deductible if applicable. This service may help you avoid the need for more expensive face-to-face services. 4. Only one practitioner may furnish and bill the service in a calendar month. 5. The patient may stop CCM services at any time (effective at the end of the month) by phone call to the office staff.  Patient agreed to services and verbal consent obtained.   The patient verbalized understanding of instructions provided today and agreed to receive a mailed copy of patient instruction and/or educational materials. Telephone follow up appointment with pharmacy team member scheduled for: 6 months  Beverly Milch, PharmD Clinical Pharmacist [facility] [phone]   Dyslipidemia Dyslipidemia is an imbalance of waxy, fat-like substances (lipids) in the blood. The body needs lipids in small amounts. Dyslipidemia often involves a high level of cholesterol  or triglycerides, which are types of lipids. Common forms of dyslipidemia include:  High levels of LDL cholesterol. LDL is the type of cholesterol that causes fatty deposits (plaques) to build up in the blood vessels that carry blood  away from your heart (arteries).  Low levels of HDL cholesterol. HDL cholesterol is the type of cholesterol that protects against heart disease. High levels of HDL remove the LDL buildup from arteries.  High levels of triglycerides. Triglycerides are a fatty substance in the blood that is linked to a buildup of plaques in the arteries. What are the causes? Primary dyslipidemia is caused by changes (mutations) in genes that are passed down through families (inherited). These mutations cause several types of dyslipidemia. Secondary dyslipidemia is caused by lifestyle choices and diseases that lead to dyslipidemia, such as:  Eating a diet that is high in animal fat.  Not getting enough exercise.  Having diabetes, kidney disease, liver disease, or thyroid disease.  Drinking large amounts of alcohol.  Using certain medicines. What increases the risk? You are more likely to develop this condition if you are an older man or if you are a woman who has gone through menopause. Other risk factors include:  Having a family history of dyslipidemia.  Taking certain medicines, including birth control pills, steroids, some diuretics, and beta-blockers.  Smoking cigarettes.  Eating a high-fat diet.  Having certain medical conditions such as diabetes, polycystic ovary syndrome (PCOS), kidney disease, liver disease, or hypothyroidism.  Not exercising regularly.  Being overweight or obese with too much belly fat. What are the signs or symptoms? In most cases, dyslipidemia does not usually cause any symptoms. In severe cases, very high lipid levels can cause:  Fatty bumps under the skin (xanthomas).  White or gray ring around the black center (pupil) of the eye. Very high triglyceride levels can cause inflammation of the pancreas (pancreatitis). How is this diagnosed? Your health care provider may diagnose dyslipidemia based on a routine blood test (fasting blood test). Because most people do  not have symptoms of the condition, this blood testing (lipid profile) is done on adults age 42 and older and is repeated every 5 years. This test checks:  Total cholesterol. This measures the total amount of cholesterol in your blood, including LDL cholesterol, HDL cholesterol, and triglycerides. A healthy number is below 200.  LDL cholesterol. The target number for LDL cholesterol is different for each person, depending on individual risk factors. Ask your health care provider what your LDL cholesterol should be.  HDL cholesterol. An HDL level of 60 or higher is best because it helps to protect against heart disease. A number below 83 for men or below 33 for women increases the risk for heart disease.  Triglycerides. A healthy triglyceride number is below 150. If your lipid profile is abnormal, your health care provider may do other blood tests. How is this treated? Treatment depends on the type of dyslipidemia that you have and your other risk factors for heart disease and stroke. Your health care provider will have a target range for your lipid levels based on this information. For many people, this condition may be treated by lifestyle changes, such as diet and exercise. Your health care provider may recommend that you:  Get regular exercise.  Make changes to your diet.  Quit smoking if you smoke. If diet changes and exercise do not help you reach your goals, your health care provider may also prescribe medicine to lower lipids. The most commonly  prescribed type of medicine lowers your LDL cholesterol (statin drug). If you have a high triglyceride level, your provider may prescribe another type of drug (fibrate) or an omega-3 fish oil supplement, or both. Follow these instructions at home:  Eating and drinking  Follow instructions from your health care provider or dietitian about eating or drinking restrictions.  Eat a healthy diet as told by your health care provider. This can help  you reach and maintain a healthy weight, lower your LDL cholesterol, and raise your HDL cholesterol. This may include: ? Limiting your calories, if you are overweight. ? Eating more fruits, vegetables, whole grains, fish, and lean meats. ? Limiting saturated fat, trans fat, and cholesterol.  If you drink alcohol: ? Limit how much you use. ? Be aware of how much alcohol is in your drink. In the [place] one drink equals one 12 oz bottle of beer (355 mL), one 5 oz glass of wine (148 mL), or one 1 oz glass of hard liquor (44 mL).  Do not drink alcohol if: ? Your health care provider tells you not to drink. ? You are pregnant, may be pregnant, or are planning to become pregnant. Activity  Get regular exercise. Start an exercise and strength training program as told by your health care provider. Ask your health care provider what activities are safe for you. Your health care provider may recommend: ? 30 minutes of aerobic activity 4-6 days a week. Brisk walking is an example of aerobic activity. ? Strength training 2 days a week. General instructions  Do not use any products that contain nicotine or tobacco, such as cigarettes, e-cigarettes, and chewing tobacco. If you need help quitting, ask your health care provider.  Take over-the-counter and prescription medicines only as told by your health care provider. This includes supplements.  Keep all follow-up visits as told by your health care provider. Contact a health care provider if:  You are: ? Having trouble sticking to your exercise or diet plan. ? Struggling to quit smoking or control your use of alcohol. Summary  Dyslipidemia often involves a high level of cholesterol or triglycerides, which are types of lipids.  Treatment depends on the type of dyslipidemia that you have and your other risk factors for heart disease and stroke.  For many people, treatment starts with lifestyle changes, such as diet and exercise.  Your health care  provider may prescribe medicine to lower lipids. This information is not intended to replace advice given to you by your health care provider. Make sure you discuss any questions you have with your health care provider. Document Revised: 11/26/2017 Document Reviewed: 11/02/2017 Elsevier Patient Education  [place].

## 2020-01-22 ENCOUNTER — Ambulatory Visit: Payer: Medicare Other | Attending: Internal Medicine

## 2020-01-22 DIAGNOSIS — Z23 Encounter for immunization: Secondary | ICD-10-CM

## 2020-01-22 NOTE — Progress Notes (Signed)
   Covid-19 Vaccination Clinic  Name:  JAYDIAN SANTANA    MRN: [id number] DOB: 15-Aug-1955  01/22/2020  Mr. Reindl was observed post Covid-19 immunization for 15 minutes without incident. He was provided with Vaccine Information Sheet and instruction to access the V-Safe system.   Mr. Dulworth was instructed to call 911 with any severe reactions post vaccine: Marland Kitchen Difficulty breathing  . Swelling of face and throat  . A fast heartbeat  . A bad rash all over body  . Dizziness and weakness

## 2020-01-28 ENCOUNTER — Other Ambulatory Visit: Payer: Self-pay | Admitting: *Deleted

## 2020-01-28 DIAGNOSIS — I2510 Atherosclerotic heart disease of native coronary artery without angina pectoris: Secondary | ICD-10-CM

## 2020-01-28 DIAGNOSIS — I10 Essential (primary) hypertension: Secondary | ICD-10-CM

## 2020-01-28 DIAGNOSIS — I5189 Other ill-defined heart diseases: Secondary | ICD-10-CM

## 2020-01-28 DIAGNOSIS — G4733 Obstructive sleep apnea (adult) (pediatric): Secondary | ICD-10-CM

## 2020-01-28 DIAGNOSIS — E785 Hyperlipidemia, unspecified: Secondary | ICD-10-CM

## 2020-01-28 DIAGNOSIS — M170 Bilateral primary osteoarthritis of knee: Secondary | ICD-10-CM

## 2020-01-28 DIAGNOSIS — I872 Venous insufficiency (chronic) (peripheral): Secondary | ICD-10-CM

## 2020-02-02 ENCOUNTER — Other Ambulatory Visit: Payer: Medicare Other

## 2020-02-02 ENCOUNTER — Other Ambulatory Visit: Payer: Self-pay

## 2020-02-02 DIAGNOSIS — Z1159 Encounter for screening for other viral diseases: Secondary | ICD-10-CM

## 2020-02-02 DIAGNOSIS — E782 Mixed hyperlipidemia: Secondary | ICD-10-CM | POA: Diagnosis not present

## 2020-02-02 DIAGNOSIS — I10 Essential (primary) hypertension: Secondary | ICD-10-CM | POA: Diagnosis not present

## 2020-02-02 DIAGNOSIS — Z125 Encounter for screening for malignant neoplasm of prostate: Secondary | ICD-10-CM

## 2020-02-03 LAB — CBC WITH DIFFERENTIAL/PLATELET
Absolute Monocytes: 599 cells/uL (ref 200–950)
Basophils Absolute: 57 cells/uL (ref 0–200)
Basophils Relative: 0.7 %
Eosinophils Absolute: 203 cells/uL (ref 15–500)
Eosinophils Relative: 2.5 %
HCT: 47.7 % (ref 38.5–50.0)
Hemoglobin: 15.8 g/dL (ref 13.2–17.1)
Lymphs Abs: 2924 cells/uL (ref 850–3900)
MCH: 31 pg (ref 27.0–33.0)
MCHC: 33.1 g/dL (ref 32.0–36.0)
MCV: 93.5 fL (ref 80.0–100.0)
MPV: 12.5 fL (ref 7.5–12.5)
Monocytes Relative: 7.4 %
Neutro Abs: 4317 cells/uL (ref 1500–7800)
Neutrophils Relative %: 53.3 %
Platelets: 267 10*3/uL (ref 140–400)
RBC: 5.1 10*6/uL (ref 4.20–5.80)
RDW: 12.8 % (ref 11.0–15.0)
Total Lymphocyte: 36.1 %
WBC: 8.1 10*3/uL (ref 3.8–10.8)

## 2020-02-03 LAB — COMPLETE METABOLIC PANEL WITH GFR
AG Ratio: 1.6 (calc) (ref 1.0–2.5)
ALT: 27 U/L (ref 9–46)
AST: 19 U/L (ref 10–35)
Albumin: 4.2 g/dL (ref 3.6–5.1)
Alkaline phosphatase (APISO): 79 U/L (ref 35–144)
BUN: 13 mg/dL (ref 7–25)
CO2: 21 mmol/L (ref 20–32)
Calcium: 9.8 mg/dL (ref 8.6–10.3)
Chloride: 104 mmol/L (ref 98–110)
Creat: 0.97 mg/dL (ref 0.70–1.25)
GFR, Est African American: 94 mL/min/{1.73_m2} (ref >=60)
GFR, Est Non African American: 81 mL/min/{1.73_m2} (ref >=60)
Globulin: 2.7 g/dL (calc) (ref 1.9–3.7)
Glucose, Bld: 99 mg/dL (ref 65–99)
Potassium: 4.6 mmol/L (ref 3.5–5.3)
Sodium: 138 mmol/L (ref 135–146)
Total Bilirubin: 0.8 mg/dL (ref 0.2–1.2)
Total Protein: 6.9 g/dL (ref 6.1–8.1)

## 2020-02-03 LAB — LIPID PANEL
Cholesterol: 129 mg/dL (ref <200)
HDL: 45 mg/dL (ref >=40)
LDL Cholesterol (Calc): 62 mg/dL (calc)
Non-HDL Cholesterol (Calc): 84 mg/dL (calc) (ref <130)
Total CHOL/HDL Ratio: 2.9 (calc) (ref <5.0)
Triglycerides: 135 mg/dL (ref <150)

## 2020-02-03 LAB — HEPATITIS C ANTIBODY
Hepatitis C Ab: NONREACTIVE
SIGNAL TO CUT-OFF: 0.01 (ref <1.00)

## 2020-02-03 LAB — PSA: PSA: 0.23 ng/mL (ref <=4.0)

## 2020-02-05 ENCOUNTER — Other Ambulatory Visit: Payer: Self-pay

## 2020-02-05 ENCOUNTER — Encounter: Payer: Self-pay | Admitting: Family Medicine

## 2020-02-05 ENCOUNTER — Ambulatory Visit: Payer: Medicare Other

## 2020-02-05 ENCOUNTER — Ambulatory Visit (INDEPENDENT_AMBULATORY_CARE_PROVIDER_SITE_OTHER): Payer: Medicare Other | Admitting: Family Medicine

## 2020-02-05 VITALS — BP 120/60 | HR 61 | Temp 98.0°F | Resp 10 | Ht 70.0 in | Wt 326.0 lb

## 2020-02-05 DIAGNOSIS — E785 Hyperlipidemia, unspecified: Secondary | ICD-10-CM

## 2020-02-05 DIAGNOSIS — G4733 Obstructive sleep apnea (adult) (pediatric): Secondary | ICD-10-CM

## 2020-02-05 DIAGNOSIS — Z9989 Dependence on other enabling machines and devices: Secondary | ICD-10-CM

## 2020-02-05 DIAGNOSIS — I10 Essential (primary) hypertension: Secondary | ICD-10-CM

## 2020-02-05 DIAGNOSIS — I2510 Atherosclerotic heart disease of native coronary artery without angina pectoris: Secondary | ICD-10-CM

## 2020-02-05 DIAGNOSIS — Z122 Encounter for screening for malignant neoplasm of respiratory organs: Secondary | ICD-10-CM

## 2020-02-05 DIAGNOSIS — Z23 Encounter for immunization: Secondary | ICD-10-CM | POA: Diagnosis not present

## 2020-02-05 DIAGNOSIS — F17200 Nicotine dependence, unspecified, uncomplicated: Secondary | ICD-10-CM

## 2020-02-05 DIAGNOSIS — Z0001 Encounter for general adult medical examination with abnormal findings: Secondary | ICD-10-CM | POA: Diagnosis not present

## 2020-02-05 DIAGNOSIS — I5189 Other ill-defined heart diseases: Secondary | ICD-10-CM

## 2020-02-05 DIAGNOSIS — I872 Venous insufficiency (chronic) (peripheral): Secondary | ICD-10-CM

## 2020-02-05 NOTE — Progress Notes (Signed)
Subjective:    Patient ID: James Branch, male    DOB: 08/08/1955, 64 y.o.   MRN: [id number]  HPI  Patient is a very pleasant 64 year old Caucasian male here today for complete physical exam.  He denies any falls, depression, or memory loss.  He does have greater than 30-pack-year history of smoking.  Unfortunately continues to smoke.  Last year we screen the patient for a AAA and this was negative.  We also performed a CT scan to screen for lung cancer and this was negative.  He is due for repeat CT scan of the lungs at the present time.  He had a colonoscopy last year that revealed tubular adenomas.  He is due for repeat colonoscopy therefore in 2025.  His PSA was just recently checked on his lab work and was outstanding.  His immunization records are listed below.  He is already had his booster on his Covid vaccine.  He received his flu shot today.  I did encourage the patient to receive Prevnar 13 at some point in the future but he defers this at the present time. Immunization History  Administered Date(s) Administered  . Influenza,inj,Quad PF,6+ Mos 03/14/2017, 03/04/2018, 01/31/2019, 02/05/2020  . Influenza-Unspecified 02/28/2016, 02/24/2017  . PFIZER SARS-COV-2 Vaccination 05/23/2019, 06/17/2019, 01/22/2020  . Pneumococcal Polysaccharide-23 01/31/2019  . Tdap 10/26/2014  . Zoster 03/28/2016   Most recent lab work is listed below: Lab on 02/02/2020  Component Date Value Ref Range Status  . WBC 02/02/2020 8.1  3.8 - 10.8 Thousand/uL Final  . RBC 02/02/2020 5.10  4.20 - 5.80 Million/uL Final  . Hemoglobin 02/02/2020 15.8  13.2 - 17.1 g/dL Final  . HCT 02/02/2020 47.7  38 - 50 % Final  . MCV 02/02/2020 93.5  80.0 - 100.0 fL Final  . MCH 02/02/2020 31.0  27.0 - 33.0 pg Final  . MCHC 02/02/2020 33.1  32.0 - 36.0 g/dL Final  . RDW 02/02/2020 12.8  11.0 - 15.0 % Final  . Platelets 02/02/2020 267  140 - 400 Thousand/uL Final  . MPV 02/02/2020 12.5  7.5 - 12.5 fL Final  . Neutro Abs  02/02/2020 4,317  1,500 - 7,800 cells/uL Final  . Lymphs Abs 02/02/2020 2,924  850 - 3,900 cells/uL Final  . Absolute Monocytes 02/02/2020 599  200 - 950 cells/uL Final  . Eosinophils Absolute 02/02/2020 203  15.0 - 500.0 cells/uL Final  . Basophils Absolute 02/02/2020 57  0.0 - 200.0 cells/uL Final  . Neutrophils Relative % 02/02/2020 53.3  % Final  . Total Lymphocyte 02/02/2020 36.1  % Final  . Monocytes Relative 02/02/2020 7.4  % Final  . Eosinophils Relative 02/02/2020 2.5  % Final  . Basophils Relative 02/02/2020 0.7  % Final  . Glucose, Bld 02/02/2020 99  65 - 99 mg/dL Final   Comment: .            Fasting reference interval .   . BUN 02/02/2020 13  7 - 25 mg/dL Final  . Creat 02/02/2020 0.97  0.70 - 1.25 mg/dL Final   Comment: For patients >51 years of age, the reference limit for Creatinine is approximately 13% higher for people identified as African-American. .   . GFR, Est Non African American 02/02/2020 81  > OR = 60 mL/min/1.67m2 Final  . GFR, Est African American 02/02/2020 94  > OR = 60 mL/min/1.60m2 Final  . BUN/Creatinine Ratio 44/96/7591 NOT APPLICABLE  6 - 22 (calc) Final  . Sodium 02/02/2020 138  135 -  146 mmol/L Final  . Potassium 02/02/2020 4.6  3.5 - 5.3 mmol/L Final  . Chloride 02/02/2020 104  98 - 110 mmol/L Final  . CO2 02/02/2020 21  20 - 32 mmol/L Final  . Calcium 02/02/2020 9.8  8.6 - 10.3 mg/dL Final  . Total Protein 02/02/2020 6.9  6.1 - 8.1 g/dL Final  . Albumin 02/02/2020 4.2  3.6 - 5.1 g/dL Final  . Globulin 02/02/2020 2.7  1.9 - 3.7 g/dL (calc) Final  . AG Ratio 02/02/2020 1.6  1.0 - 2.5 (calc) Final  . Total Bilirubin 02/02/2020 0.8  0.2 - 1.2 mg/dL Final  . Alkaline phosphatase (APISO) 02/02/2020 79  35 - 144 U/L Final  . AST 02/02/2020 19  10 - 35 U/L Final  . ALT 02/02/2020 27  9 - 46 U/L Final  . Cholesterol 02/02/2020 129  <200 mg/dL Final  . HDL 02/02/2020 45  > OR = 40 mg/dL Final  . Triglycerides 02/02/2020 135  <150 mg/dL Final  .  LDL Cholesterol (Calc) 02/02/2020 62  mg/dL (calc) Final   Comment: Reference range: <100 . Desirable range <100 mg/dL for primary prevention;   <70 mg/dL for patients with CHD or diabetic patients  with > or = 2 CHD risk factors. Marland Kitchen LDL-C is now calculated using the Martin-Hopkins  calculation, which is a validated novel method providing  better accuracy than the Friedewald equation in the  estimation of LDL-C.  Cresenciano Genre et al. Annamaria Helling. 5102;585(27): 2061-2068  (http://education.[facility].com/faq/FAQ164)   . Total CHOL/HDL Ratio 02/02/2020 2.9  <5.0 (calc) Final  . Non-HDL Cholesterol (Calc) 02/02/2020 84  <130 mg/dL (calc) Final   Comment: For patients with diabetes plus 1 major ASCVD risk  factor, treating to a non-HDL-C goal of <100 mg/dL  (LDL-C of <70 mg/dL) is considered a therapeutic  option.   Marland Kitchen PSA 02/02/2020 0.23  < OR = 4.0 ng/mL Final   Comment: The total PSA value from this assay system is  standardized against the WHO standard. The test  result will be approximately 20% lower when compared  to the equimolar-standardized total PSA (Beckman  Coulter). Comparison of serial PSA results should be  interpreted with this fact in mind. . This test was performed using the Siemens  chemiluminescent method. Values obtained from  different assay methods cannot be used interchangeably. PSA levels, regardless of value, should not be interpreted as absolute evidence of the presence or absence of disease.   . Hepatitis C Ab 02/02/2020 NON-REACTIVE  NON-REACTI Final  . SIGNAL TO CUT-OFF 02/02/2020 0.01  <1.00 Final   Comment: . HCV antibody was non-reactive. There is no laboratory  evidence of HCV infection. . In most cases, no further action is required. However, if recent HCV exposure is suspected, a test for HCV RNA (test code 5756555963) is suggested. . For additional information please refer to http://education.[facility].com/faq/FAQ22v1 (This link is being  provided for informational/ educational purposes only.) .      Past Medical History:  Diagnosis Date  . Arthritis    knees  . Asthma   . CAD (coronary artery disease)   . COPD (chronic obstructive pulmonary disease) ([place])   . Diastolic dysfunction   . GERD (gastroesophageal reflux disease)   . Hyperlipidemia   . Hypertension   . Obesity   . OSA on CPAP    ahi-84, 18 CWP  . Sleep apnea    wears cpap   . Smoker   . Umbilical hernia    Past Surgical  History:  Procedure Laterality Date  . CARDIAC CATHETERIZATION    . COLONOSCOPY  2007  . TONSILLECTOMY  1967   Current Outpatient Medications on File Prior to Visit  Medication Sig Dispense Refill  . albuterol (VENTOLIN HFA) 108 (90 Base) MCG/ACT inhaler INHALE 2 PUFFS INTO THE LUNGS 4 (FOUR) TIMES DAILY. 8.5 each 11  . aspirin EC 81 MG tablet Take 81 mg by mouth daily.    Marland Kitchen atorvastatin (LIPITOR) 20 MG tablet Take 1 tablet (20 mg total) by mouth daily. 90 tablet 3  . betamethasone dipropionate 0.05 % cream APPLY TO AFFECTED AREA TWICE A DAY 45 g 3  . cholecalciferol (VITAMIN D3) 25 MCG (1000 UT) tablet Take 1,000 Units by mouth daily.    . furosemide (LASIX) 40 MG tablet Take 1 tablet (40 mg total) by mouth daily. 90 tablet 3  . lisinopril (ZESTRIL) 20 MG tablet Take 1 tablet (20 mg total) by mouth daily. 90 tablet 3  . nitroGLYCERIN (NITROSTAT) 0.4 MG SL tablet PLACE 1 TABLET (0.4 MG TOTAL) UNDER THE TONGUE EVERY 5 (FIVE) MINUTES AS NEEDED FOR CHEST PAIN. 25 tablet 2  . metoprolol tartrate (LOPRESSOR) 25 MG tablet Take 1 tablet (25 mg total) by mouth 2 (two) times daily. 180 tablet 2   No current facility-administered medications on file prior to visit.   Allergies  Allergen Reactions  . Penicillins Swelling   Social History   Socioeconomic History  . Marital status: Married    Spouse name: Not on file  . Number of children: 0  . Years of education: Not on file  . Highest education level: Not on file  Occupational  History  . Not on file  Tobacco Use  . Smoking status: Current Some Day Smoker    Packs/day: 0.50    Years: 20.00    Pack years: 10.00    Types: Cigarettes    Last attempt to quit: 02/17/2019    Years since quitting: 0.9  . Smokeless tobacco: Never Used  Vaping Use  . Vaping Use: Never used  Substance and Sexual Activity  . Alcohol use: No    Alcohol/week: 0.0 standard drinks  . Drug use: No  . Sexual activity: Yes    Birth control/protection: Condom  Other Topics Concern  . Not on file  Social History Narrative  . Not on file   Social Determinants of Health   Financial Resource Strain: Low Risk   . Difficulty of Paying Living Expenses: Not very hard  Food Insecurity:   . Worried About Charity fundraiser in the Last Year: Not on file  . Ran Out of Food in the Last Year: Not on file  Transportation Needs:   . Lack of Transportation (Medical): Not on file  . Lack of Transportation (Non-Medical): Not on file  Physical Activity:   . Days of Exercise per Week: Not on file  . Minutes of Exercise per Session: Not on file  Stress:   . Feeling of Stress : Not on file  Social Connections:   . Frequency of Communication with Friends and Family: Not on file  . Frequency of Social Gatherings with Friends and Family: Not on file  . Attends Religious Services: Not on file  . Active Member of Clubs or Organizations: Not on file  . Attends Archivist Meetings: Not on file  . Marital Status: Not on file  Intimate Partner Violence:   . Fear of Current or Ex-Partner: Not on file  .  Emotionally Abused: Not on file  . Physically Abused: Not on file  . Sexually Abused: Not on file      Review of Systems  All other systems reviewed and are negative.      Objective:   Physical Exam Vitals reviewed.  Constitutional:      General: He is not in acute distress.    Appearance: He is well-developed. He is not diaphoretic.  HENT:     Head: Normocephalic and atraumatic.      Right Ear: External ear normal.     Left Ear: External ear normal.     Nose: Nose normal.     Mouth/Throat:     Pharynx: No oropharyngeal exudate.  Eyes:     General: No scleral icterus.       Right eye: No discharge.        Left eye: No discharge.     Conjunctiva/sclera: Conjunctivae normal.     Pupils: Pupils are equal, round, and reactive to light.  Neck:     Thyroid: No thyromegaly.     Vascular: No JVD.     Trachea: No tracheal deviation.  Cardiovascular:     Rate and Rhythm: Normal rate and regular rhythm.     Heart sounds: Normal heart sounds. No murmur heard.  No friction rub. No gallop.   Pulmonary:     Effort: Pulmonary effort is normal. No respiratory distress.     Breath sounds: Normal breath sounds. No stridor. No wheezing or rales.  Chest:     Chest wall: No tenderness.  Abdominal:     General: Bowel sounds are normal. There is no distension.     Palpations: Abdomen is soft. There is no mass.     Tenderness: There is no abdominal tenderness. There is no guarding or rebound.     Hernia: A hernia is present.    Musculoskeletal:        General: No tenderness or deformity.     Cervical back: Normal range of motion and neck supple.     Right knee: Decreased range of motion.     Left knee: Decreased range of motion.  Lymphadenopathy:     Cervical: No cervical adenopathy.  Skin:    General: Skin is warm.     Coloration: Skin is not pale.     Findings: No erythema or rash.  Neurological:     Mental Status: He is alert and oriented to person, place, and time.     Cranial Nerves: No cranial nerve deficit.     Deep Tendon Reflexes: Reflexes are normal and symmetric.  Psychiatric:        Behavior: Behavior normal.        Thought Content: Thought content normal.        Judgment: Judgment normal.   Large umbilical hernia        Assessment & Plan:  Need for immunization against influenza - Plan: Flu Vaccine QUAD 36+ mos IM  Screening for lung cancer -  Plan: CT CHEST LUNG CA SCREEN LOW DOSE W/O CM  Diastolic dysfunction  Benign essential HTN  OSA on CPAP  Hyperlipidemia, unspecified hyperlipidemia type  Chronic venous insufficiency  Coronary artery disease involving native coronary artery of native heart without angina pectoris  Smoker  Patient received his flu shot today.  I recommended Prevnar 13 at some point the future.  His Covid vaccine is already up-to-date.  Colonoscopy is up-to-date.  PSA is outstanding.  Lab work is excellent including  his cholesterol which is well below his goal.  Biggest issues for this patient include morbid obesity and his smoking.  Respectfully asked the patient to try to start exercising and gradually build up to 30 minutes a day 5 days a week in an effort to achieve weight loss.  Also recommended smoking cessation.  Schedule the patient for CT scan of the lung to screen for lung cancer.  Regular anticipatory guidance is provided.  He denies any falls, depression, or memory loss.

## 2020-02-07 ENCOUNTER — Other Ambulatory Visit: Payer: Self-pay | Admitting: Cardiology

## 2020-02-07 DIAGNOSIS — I2510 Atherosclerotic heart disease of native coronary artery without angina pectoris: Secondary | ICD-10-CM

## 2020-02-23 ENCOUNTER — Ambulatory Visit (HOSPITAL_COMMUNITY): Payer: Medicare Other

## 2020-02-24 ENCOUNTER — Encounter: Payer: Self-pay | Admitting: Family Medicine

## 2020-02-26 ENCOUNTER — Other Ambulatory Visit: Payer: Self-pay | Admitting: Family Medicine

## 2020-02-26 MED ORDER — PREDNISONE 20 MG PO TABS: ORAL_TABLET | ORAL | 0 refills | Status: DC

## 2020-03-08 ENCOUNTER — Telehealth: Payer: Self-pay | Admitting: Pharmacist

## 2020-03-08 NOTE — Progress Notes (Addendum)
    Chronic Care Management Pharmacy Assistant   Name: James Branch  MRN: [id number] DOB: 1956/04/04  Branch for Encounter: Medication Review   PCP : [name], MD  Allergies:   Allergies  Allergen Reactions   Penicillins Swelling    Medications: Outpatient Encounter Medications as of 03/08/2020  Medication Sig   albuterol (VENTOLIN HFA) 108 (90 Base) MCG/ACT inhaler INHALE 2 PUFFS INTO THE LUNGS 4 (FOUR) TIMES DAILY.   aspirin EC 81 MG tablet Take 81 mg by mouth daily.   atorvastatin (LIPITOR) 20 MG tablet Take 1 tablet (20 mg total) by mouth daily.   betamethasone dipropionate 0.05 % cream APPLY TO AFFECTED AREA TWICE A DAY   cholecalciferol (VITAMIN D3) 25 MCG (1000 UT) tablet Take 1,000 Units by mouth daily.   furosemide (LASIX) 40 MG tablet Take 1 tablet (40 mg total) by mouth daily.   lisinopril (ZESTRIL) 20 MG tablet Take 1 tablet (20 mg total) by mouth daily.   metoprolol tartrate (LOPRESSOR) 25 MG tablet Take 1 tablet (25 mg total) by mouth 2 (two) times daily.   nitroGLYCERIN (NITROSTAT) 0.4 MG SL tablet PLACE 1 TABLET (0.4 MG TOTAL) UNDER THE TONGUE EVERY 5 (FIVE) MINUTES AS NEEDED FOR CHEST PAIN.   predniSONE (DELTASONE) 20 MG tablet 3 tabs poqday 1-2, 2 tabs poqday 3-4, 1 tab poqday 5-6   No facility-administered encounter medications on file as of 03/08/2020.    Current Diagnosis: Patient Active Problem List   Diagnosis Date Noted   Exertional dyspnea 09/05/2019   Mixed hyperlipidemia 02/20/2019   CAD (coronary artery disease)    Morbid obesity ([place])    Smoker    OSA on CPAP    Diastolic dysfunction     Goals Addressed   None    Reviewed chart for medication changes ahead of medication coordination call.  Office Visits: 02-05-2020 (PCP) Patient presented in the office for his physical exam. He received his flu shot at the time of the visit.  Schedule the patient for CT scan of the lung to screen for lung cancer.  No Consults, or hospital  visits since last Pharmacist visit.   No medication changes indicated.  BP Readings from Last 3 Encounters:  02/05/20 120/60  10/15/19 (!) 126/59  09/05/19 135/65    No results found for: HGBA1C   Patient obtains medications through Vials  90 Days  Easy open caps  This is the patient's first adherence delivery.  Patient is due for next adherence delivery on: 03-14-2020. Called patient and reviewed medications and coordinated delivery.  This delivery to include: Albuterol Sulfate 90 mcg daily Atorvastatin 20 mg daily Furosemide 40 mg daily Metoprolol tartrate 25 mg twice daily  Lisinopril 20 mg daily  Patient declined the following medications due to PRN use: Nitroglycerin 0.4mg  SL  Betamethasone Diproprionate Cr 0.05%    Patient needs prescription sent to [facility] for Metoprolol Tartrate 25 mg. Will contact the specialist to be forwarded  Confirmed delivery date of 03-14-2020, advised patient that pharmacy will contact them the morning of delivery.  PharmD contacted specialist on 11/23 and refill on metoprolol sent in to [facility].  Follow-Up:  Coordination of Enhanced Pharmacy Services and Pharmacist Review   Fanny Skates, [place] Pharmacist Assistant [phone]

## 2020-03-09 ENCOUNTER — Other Ambulatory Visit: Payer: Self-pay

## 2020-03-09 DIAGNOSIS — I2510 Atherosclerotic heart disease of native coronary artery without angina pectoris: Secondary | ICD-10-CM

## 2020-03-09 MED ORDER — METOPROLOL TARTRATE 25 MG PO TABS: 25.0000 mg | ORAL_TABLET | Freq: Two times a day (BID) | ORAL | 2 refills | Status: DC

## 2020-03-16 ENCOUNTER — Other Ambulatory Visit: Payer: Self-pay | Admitting: Family Medicine

## 2020-03-17 ENCOUNTER — Ambulatory Visit (HOSPITAL_COMMUNITY)
Admission: RE | Admit: 2020-03-17 | Discharge: 2020-03-17 | Disposition: A | Discharge status: Home / Self Care | Payer: Medicare Other | Source: Ambulatory Visit | Attending: Family Medicine | Admitting: Family Medicine

## 2020-03-17 ENCOUNTER — Other Ambulatory Visit: Payer: Self-pay

## 2020-03-17 DIAGNOSIS — Z87891 Personal history of nicotine dependence: Secondary | ICD-10-CM | POA: Diagnosis not present

## 2020-03-17 DIAGNOSIS — Z122 Encounter for screening for malignant neoplasm of respiratory organs: Secondary | ICD-10-CM | POA: Diagnosis not present

## 2020-03-29 DIAGNOSIS — G4733 Obstructive sleep apnea (adult) (pediatric): Secondary | ICD-10-CM | POA: Diagnosis not present

## 2020-04-02 ENCOUNTER — Other Ambulatory Visit: Payer: Self-pay

## 2020-04-02 ENCOUNTER — Telehealth (INDEPENDENT_AMBULATORY_CARE_PROVIDER_SITE_OTHER): Payer: Medicare Other | Admitting: Nurse Practitioner

## 2020-04-02 VITALS — Temp 100.0°F

## 2020-04-02 DIAGNOSIS — J069 Acute upper respiratory infection, unspecified: Secondary | ICD-10-CM

## 2020-04-02 MED ORDER — BENZONATATE 100 MG PO CAPS: 100.0000 mg | ORAL_CAPSULE | Freq: Three times a day (TID) | ORAL | 0 refills | Status: DC | PRN

## 2020-04-02 MED ORDER — PREDNISONE 10 MG PO TABS: ORAL_TABLET | ORAL | 0 refills | Status: AC

## 2020-04-02 MED ORDER — GUAIFENESIN ER 600 MG PO TB12: 600.0000 mg | ORAL_TABLET | Freq: Two times a day (BID) | ORAL | 0 refills | Status: DC | PRN

## 2020-04-02 NOTE — Progress Notes (Signed)
Subjective:    Patient ID: James Branch, male    DOB: 18-Nov-1955, 64 y.o.   MRN: [id number]  HPI: James Branch is a 64 y.o. male presenting for upper re  Chief Complaint  Patient presents with   Illness    For the past 3 days, having sore throat and congestion. Has been coughing now for the past 24hrs. Fever ranging from 99.5 to 100. Controlling temp with tylenol. Using flonase to break up congestion   UPPER RESPIRATORY TRACT INFECTION Onset: 03/31/2020 Worst symptom: congestion Fever: yes; low-grade 99-100 Cough: yes; productive  Shortness of breath: yes; has been using albuterol inhaler ~4 hours Wheezing: yes Chest pain: no Chest tightness: no Chest congestion: yes Nasal congestion: yes Runny nose: yes Post nasal drip: yes  Sneezing: no Sore throat: yes Swollen glands: no Sinus pressure: no Headache: no Face pain: no Toothache: no; wears dentures Ear pain: no  Ear pressure: no  Eyes red/itching:yes Eye drainage/crusting: watery  Nausea: yes;  Vomiting: no Diarrhea: no Change in appetite: yes Loss of taste/smell: no Rash: no Fatigue: yes Sick contacts: no Strep contacts: no  Context: worse Recurrent sinusitis: no Treatments attempted:  Tylenol, flonase, robitussin Relief with OTC medications: yes  Allergies  Allergen Reactions   Penicillins Swelling    Outpatient Encounter Medications as of 04/02/2020  Medication Sig   albuterol (VENTOLIN HFA) 108 (90 Base) MCG/ACT inhaler INHALE 2 PUFFS INTO THE LUNGS 4 (FOUR) TIMES DAILY.   aspirin EC 81 MG tablet Take 81 mg by mouth daily.   atorvastatin (LIPITOR) 20 MG tablet Take 1 tablet (20 mg total) by mouth daily.   betamethasone dipropionate 0.05 % cream APPLY TO AFFECTED AREA TWICE A DAY   cholecalciferol (VITAMIN D3) 25 MCG (1000 UT) tablet Take 1,000 Units by mouth daily.   furosemide (LASIX) 40 MG tablet TAKE 1 TABLET BY MOUTH EVERY DAY   lisinopril (ZESTRIL) 20 MG tablet Take 1 tablet  (20 mg total) by mouth daily.   metoprolol tartrate (LOPRESSOR) 25 MG tablet Take 1 tablet (25 mg total) by mouth 2 (two) times daily.   nitroGLYCERIN (NITROSTAT) 0.4 MG SL tablet PLACE 1 TABLET (0.4 MG TOTAL) UNDER THE TONGUE EVERY 5 (FIVE) MINUTES AS NEEDED FOR CHEST PAIN.   benzonatate (TESSALON) 100 MG capsule Take 1 capsule (100 mg total) by mouth 3 (three) times daily as needed for cough. Take first dose at nighttime and monitor for drowsiness.  If this medication makes you drowsy, do not take while driving or operating heavy machinery.   guaiFENesin (MUCINEX) 600 MG 12 hr tablet Take 1 tablet (600 mg total) by mouth 2 (two) times daily as needed for cough or to loosen phlegm.   predniSONE (DELTASONE) 10 MG tablet Take 6 tablets (60 mg total) by mouth daily with breakfast for 1 day, THEN 5 tablets (50 mg total) daily with breakfast for 1 day, THEN 4 tablets (40 mg total) daily with breakfast for 1 day, THEN 3 tablets (30 mg total) daily with breakfast for 1 day, THEN 2 tablets (20 mg total) daily with breakfast for 1 day, THEN 1 tablet (10 mg total) daily with breakfast for 1 day.   [DISCONTINUED] predniSONE (DELTASONE) 20 MG tablet 3 tabs poqday 1-2, 2 tabs poqday 3-4, 1 tab poqday 5-6   No facility-administered encounter medications on file as of 04/02/2020.    Patient Active Problem List   Diagnosis Date Noted   Upper respiratory tract infection 04/02/2020   Exertional  dyspnea 09/05/2019   Mixed hyperlipidemia 02/20/2019   CAD (coronary artery disease)    Morbid obesity ([facility])    Smoker    OSA on CPAP    Diastolic dysfunction     Past Medical History:  Diagnosis Date   Arthritis    knees   Asthma    CAD (coronary artery disease)    COPD (chronic obstructive pulmonary disease) ([facility])    Diastolic dysfunction    GERD (gastroesophageal reflux disease)    Hyperlipidemia    Hypertension    Obesity    OSA on CPAP    ahi-84, 18 CWP   Sleep apnea    wears  cpap    Smoker    Umbilical hernia     Relevant past medical, surgical, family and social history reviewed and updated as indicated. Interim medical history since our last visit reviewed.  Review of Systems  Constitutional: Positive for fatigue and fever. Negative for activity change, appetite change, chills and diaphoresis.  HENT: Positive for congestion, postnasal drip, rhinorrhea and sore throat. Negative for ear discharge, ear pain, facial swelling, hearing loss, nosebleeds, sinus pressure, sinus pain, sneezing and trouble swallowing.   Respiratory: Positive for cough, shortness of breath and wheezing. Negative for chest tightness.   Cardiovascular: Negative.  Negative for chest pain.  Gastrointestinal: Negative.  Negative for abdominal pain, constipation, diarrhea, nausea and vomiting.  Skin: Negative.  Negative for rash.  Neurological: Negative.  Negative for headaches.  Hematological: Negative.  Negative for adenopathy.    Per HPI unless specifically indicated above     Objective:    Temp 100 F (37.8 C)   Wt Readings from Last 3 Encounters:  02/05/20 (!) 326 lb (147.9 kg)  10/15/19 (!) 339 lb (153.8 kg)  09/05/19 (!) 341 lb 9.6 oz (154.9 kg)    Physical Exam Physical examination unable to be performed due to lack of equipment.  Patient talking in complete sentences with frequent coughing during telephone visit.    Assessment & Plan:   Problem List Items Addressed This Visit      Respiratory   Upper respiratory tract infection - Primary    Acute, ongoing x 3 days.  Symptoms likely viral in etiology.  Encouraged/offered COVID testing/other viral testing and stay home/isolate until results return.  Declined testing for now, will try to isolate from wife.  Is fully vaccinated for COVID with booster in October.  Worried with wheezing/shortness of breath unrelieved by rescue inhaler.  Instructed to continue to use inhaler every 4-6 hours as needed and will start prednisone  taper.  Also can start tessalon perls for dry cough.  Reassured patient that symptoms and exam findings are most consistent with a viral upper respiratory infection and explained lack of efficacy of antibiotics against viruses.  Discussed expected course and features suggestive of secondary bacterial infection.  Continue supportive care. Increase fluid intake with water or electrolyte solution like pedialyte. Encouraged acetaminophen as needed for fever/pain. Encouraged salt water gargling, chloraseptic spray and throat lozenges. Encouraged OTC guaifenesin. Encouraged saline sinus flushes and/or neti with humidified air.  If symptoms last >7-10 days, notify us and we will start antibiotics.  With any sudden onset of chest pain or shortness of breath, go to ER.       Relevant Medications   predniSONE (DELTASONE) 10 MG tablet   guaiFENesin (MUCINEX) 600 MG 12 hr tablet   benzonatate (TESSALON) 100 MG capsule       Follow up plan: Return if symptoms  worsen or fail to improve.   This visit was completed via telephone due to the restrictions of the COVID-19 pandemic. All issues as above were discussed and addressed but no physical exam was performed. If it was felt that the patient should be evaluated in the office, they were directed there. The patient verbally consented to this visit. Patient was unable to complete an audio/visual visit due to Lack of equipment.  Location of the patient: home  Location of the provider: work  Those involved with this call:   Provider: Carnella Guadalajara, DNP  CMA: Annabelle Harman, CMA  Front Desk/Registration: Jeralene Peters   Time spent on call: 12 minutes on the phone discussing health concerns. 20 minutes total spent in review of patient's record and preparation of their chart.  I verified patient identity using two factors (patient name and date of birth). Patient consents verbally to being seen via telemedicine visit today.

## 2020-04-02 NOTE — Patient Instructions (Signed)

## 2020-04-02 NOTE — Assessment & Plan Note (Signed)
Acute, ongoing x 3 days.  Symptoms likely viral in etiology.  Encouraged/offered COVID testing/other viral testing and stay home/isolate until results return.  Declined testing for now, will try to isolate from wife.  Is fully vaccinated for COVID with booster in October.  Worried with wheezing/shortness of breath unrelieved by rescue inhaler.  Instructed to continue to use inhaler every 4-6 hours as needed and will start prednisone taper.  Also can start tessalon perls for dry cough.  Reassured patient that symptoms and exam findings are most consistent with a viral upper respiratory infection and explained lack of efficacy of antibiotics against viruses.  Discussed expected course and features suggestive of secondary bacterial infection.  Continue supportive care. Increase fluid intake with water or electrolyte solution like pedialyte. Encouraged acetaminophen as needed for fever/pain. Encouraged salt water gargling, chloraseptic spray and throat lozenges. Encouraged OTC guaifenesin. Encouraged saline sinus flushes and/or neti with humidified air.  If symptoms last >7-10 days, notify us and we will start antibiotics.  With any sudden onset of chest pain or shortness of breath, go to ER.

## 2020-04-05 ENCOUNTER — Ambulatory Visit: Payer: Self-pay | Admitting: Pharmacist

## 2020-04-05 NOTE — Chronic Care Management (AMB) (Signed)
Chronic Care Management   Follow Up Note   04/05/2020 Name: James Branch MRN: [id number] DOB: January 05, 1956  Referred by: James Frizzle, MD Reason for referral : No chief complaint on file.   James Branch is a 64 y.o. year old male who is a primary care patient of [name], [name], MD. The CCM team was consulted for assistance with chronic disease management and care coordination needs.    Review of patient status, including review of consultants reports, relevant laboratory and other test results, and collaboration with appropriate care team members and the patient's provider was performed as part of comprehensive patient evaluation and provision of chronic care management services.    SDOH (Social Determinants of Health) assessments performed: No See Care Plan activities for detailed interventions related to [facility])     Outpatient Encounter Medications as of 04/05/2020  Medication Sig  . albuterol (VENTOLIN HFA) 108 (90 Base) MCG/ACT inhaler INHALE 2 PUFFS INTO THE LUNGS 4 (FOUR) TIMES DAILY.  Marland Kitchen aspirin EC 81 MG tablet Take 81 mg by mouth daily.  Marland Kitchen atorvastatin (LIPITOR) 20 MG tablet Take 1 tablet (20 mg total) by mouth daily.  . benzonatate (TESSALON) 100 MG capsule Take 1 capsule (100 mg total) by mouth 3 (three) times daily as needed for cough. Take first dose at nighttime and monitor for drowsiness.  If this medication makes you drowsy, do not take while driving or operating heavy machinery.  . betamethasone dipropionate 0.05 % cream APPLY TO AFFECTED AREA TWICE A DAY  . cholecalciferol (VITAMIN D3) 25 MCG (1000 UT) tablet Take 1,000 Units by mouth daily.  . furosemide (LASIX) 40 MG tablet TAKE 1 TABLET BY MOUTH EVERY DAY  . guaiFENesin (MUCINEX) 600 MG 12 hr tablet Take 1 tablet (600 mg total) by mouth 2 (two) times daily as needed for cough or to loosen phlegm.  Marland Kitchen lisinopril (ZESTRIL) 20 MG tablet Take 1 tablet (20 mg total) by mouth daily.  . metoprolol tartrate  (LOPRESSOR) 25 MG tablet Take 1 tablet (25 mg total) by mouth 2 (two) times daily.  . nitroGLYCERIN (NITROSTAT) 0.4 MG SL tablet PLACE 1 TABLET (0.4 MG TOTAL) UNDER THE TONGUE EVERY 5 (FIVE) MINUTES AS NEEDED FOR CHEST PAIN.  Marland Kitchen predniSONE (DELTASONE) 10 MG tablet Take 6 tablets (60 mg total) by mouth daily with breakfast for 1 day, THEN 5 tablets (50 mg total) daily with breakfast for 1 day, THEN 4 tablets (40 mg total) daily with breakfast for 1 day, THEN 3 tablets (30 mg total) daily with breakfast for 1 day, THEN 2 tablets (20 mg total) daily with breakfast for 1 day, THEN 1 tablet (10 mg total) daily with breakfast for 1 day.   No facility-administered encounter medications on file as of 04/05/2020.     Reviewed chart for medication changes ahead of medication coordination call.  Recent OV's 04/02/2020 Edsel Petrin, NP) - patient started on prednisone, tessalon perles and mucinex for bronchitic issues.  No medication changes indicated.  BP Readings from Last 3 Encounters:  02/05/20 120/60  10/15/19 (!) 126/59  09/05/19 135/65    No results found for: HGBA1C   Patient obtains medications through Vials  90 days easy open caps   Last adherence delivery included: Albuterol Sulfate 90 mcg daily Atorvastatin 20 mg daily Furosemide 40 mg daily Metoprolol tartrate 25 mg twice daily  Lisinopril 20 mg daily  Patient declined the following meds last month due to prn use: Nitroglycerin 0.4mg  SL  Betamethasone Diproprionate  Cr 0.05%    Patient is due for next adherence delivery on: 04/11/2020. Called patient and reviewed medications and coordinated delivery.  This delivery to include: No medications at this time - patient received a 90 days supply on 03/14/2020   No refills needed at this time.  Advised patient to contact us should he need any prn medications before next monthly call.  Beverly Milch, PharmD Clinical Pharmacist [place] [phone]

## 2020-04-06 ENCOUNTER — Other Ambulatory Visit: Payer: Self-pay | Admitting: Nurse Practitioner

## 2020-04-06 MED ORDER — AZITHROMYCIN 250 MG PO TABS: ORAL_TABLET | ORAL | 0 refills | Status: DC

## 2020-04-07 ENCOUNTER — Other Ambulatory Visit: Payer: Self-pay | Admitting: Nurse Practitioner

## 2020-04-07 DIAGNOSIS — J069 Acute upper respiratory infection, unspecified: Secondary | ICD-10-CM

## 2020-04-07 NOTE — Progress Notes (Signed)
Video visit with wife 12/21, wife reported patient is not improving.  Rx zpack sent in and advised to get chest x-ray

## 2020-04-08 ENCOUNTER — Encounter: Payer: Self-pay | Admitting: Family Medicine

## 2020-04-08 ENCOUNTER — Ambulatory Visit (INDEPENDENT_AMBULATORY_CARE_PROVIDER_SITE_OTHER): Payer: Medicare Other | Admitting: Family Medicine

## 2020-04-08 ENCOUNTER — Other Ambulatory Visit: Payer: Self-pay

## 2020-04-08 VITALS — BP 128/82 | HR 70 | Temp 97.0°F | Ht 70.0 in | Wt 350.0 lb

## 2020-04-08 DIAGNOSIS — R059 Cough, unspecified: Secondary | ICD-10-CM

## 2020-04-08 DIAGNOSIS — J441 Chronic obstructive pulmonary disease with (acute) exacerbation: Secondary | ICD-10-CM

## 2020-04-08 MED ORDER — PREDNISONE 20 MG PO TABS: 40.0000 mg | ORAL_TABLET | Freq: Every day | ORAL | 0 refills | Status: DC

## 2020-04-08 NOTE — Progress Notes (Signed)
Subjective:    Patient ID: James Branch, male    DOB: [birth year], 64 y.o.   MRN: [id number]  HPI Symptoms began 9 days ago.  Symptoms began with sore throat and head congestion and rhinorrhea.  They have subsequently developed into shortness of breath, cough, wheezing, chest tightness.  He has a history of COPD and COPD exacerbations.  He was treated with a prednisone taper pack which helped some.  However he has now completed that.  My partner put him on a Z-Pak recently he is on day 3 of.  He continues to have low-grade fevers, chest congestion, wheezing, and coughing.  The cough is nonproductive Past Medical History:  Diagnosis Date  . Arthritis    knees  . Asthma   . CAD (coronary artery disease)   . COPD (chronic obstructive pulmonary disease) ([place])   . Diastolic dysfunction   . GERD (gastroesophageal reflux disease)   . Hyperlipidemia   . Hypertension   . Obesity   . OSA on CPAP    ahi-84, 18 CWP  . Sleep apnea    wears cpap   . Smoker   . Umbilical hernia    Past Surgical History:  Procedure Laterality Date  . CARDIAC CATHETERIZATION    . COLONOSCOPY  2007  . TONSILLECTOMY  1967   Current Outpatient Medications on File Prior to Visit  Medication Sig Dispense Refill  . albuterol (VENTOLIN HFA) 108 (90 Base) MCG/ACT inhaler INHALE 2 PUFFS INTO THE LUNGS 4 (FOUR) TIMES DAILY. 8.5 each 11  . aspirin EC 81 MG tablet Take 81 mg by mouth daily.    Marland Kitchen atorvastatin (LIPITOR) 20 MG tablet Take 1 tablet (20 mg total) by mouth daily. 90 tablet 3  . benzonatate (TESSALON) 100 MG capsule Take 1 capsule (100 mg total) by mouth 3 (three) times daily as needed for cough. Take first dose at nighttime and monitor for drowsiness.  If this medication makes you drowsy, do not take while driving or operating heavy machinery. 20 capsule 0  . betamethasone dipropionate 0.05 % cream APPLY TO AFFECTED AREA TWICE A DAY 45 g 3  . cholecalciferol (VITAMIN D3) 25 MCG (1000 UT) tablet Take 1,000  Units by mouth daily.    . furosemide (LASIX) 40 MG tablet TAKE 1 TABLET BY MOUTH EVERY DAY 90 tablet 3  . guaiFENesin (MUCINEX) 600 MG 12 hr tablet Take 1 tablet (600 mg total) by mouth 2 (two) times daily as needed for cough or to loosen phlegm. 30 tablet 0  . lisinopril (ZESTRIL) 20 MG tablet Take 1 tablet (20 mg total) by mouth daily. 90 tablet 3  . metoprolol tartrate (LOPRESSOR) 25 MG tablet Take 1 tablet (25 mg total) by mouth 2 (two) times daily. 180 tablet 2  . nitroGLYCERIN (NITROSTAT) 0.4 MG SL tablet PLACE 1 TABLET (0.4 MG TOTAL) UNDER THE TONGUE EVERY 5 (FIVE) MINUTES AS NEEDED FOR CHEST PAIN. 25 tablet 2  . predniSONE (DELTASONE) 10 MG tablet Take 6 tablets (60 mg total) by mouth daily with breakfast for 1 day, THEN 5 tablets (50 mg total) daily with breakfast for 1 day, THEN 4 tablets (40 mg total) daily with breakfast for 1 day, THEN 3 tablets (30 mg total) daily with breakfast for 1 day, THEN 2 tablets (20 mg total) daily with breakfast for 1 day, THEN 1 tablet (10 mg total) daily with breakfast for 1 day. 21 tablet 0   No current facility-administered medications on file prior to visit.  Allergies  Allergen Reactions  . Penicillins Swelling   Social History   Socioeconomic History  . Marital status: Married    Spouse name: Not on file  . Number of children: 0  . Years of education: Not on file  . Highest education level: Not on file  Occupational History  . Not on file  Tobacco Use  . Smoking status: Current Some Day Smoker    Packs/day: 0.50    Years: 20.00    Pack years: 10.00    Types: Cigarettes    Last attempt to quit: 02/17/2019    Years since quitting: 1.1  . Smokeless tobacco: Never Used  Vaping Use  . Vaping Use: Never used  Substance and Sexual Activity  . Alcohol use: No    Alcohol/week: 0.0 standard drinks  . Drug use: No  . Sexual activity: Yes    Birth control/protection: Condom  Other Topics Concern  . Not on file  Social History Narrative   . Not on file   Social Determinants of Health   Financial Resource Strain: Low Risk   . Difficulty of Paying Living Expenses: Not very hard  Food Insecurity: Not on file  Transportation Needs: Not on file  Physical Activity: Not on file  Stress: Not on file  Social Connections: Not on file  Intimate Partner Violence: Not on file      Review of Systems  All other systems reviewed and are negative.      Objective:   Physical Exam Vitals reviewed.  HENT:     Right Ear: External ear normal.     Left Ear: External ear normal.     Nose: Nose normal.     Mouth/Throat:     Pharynx: No oropharyngeal exudate.  Cardiovascular:     Rate and Rhythm: Normal rate and regular rhythm.     Heart sounds: Normal heart sounds.  Pulmonary:     Effort: No accessory muscle usage or respiratory distress.     Breath sounds: Decreased breath sounds and wheezing present.           Assessment & Plan:  Cough - Plan: SARS-COV-2 RNA,(COVID-19) QUAL NAAT  COPD exacerbation ([place])  I believe the patient had a viral upper respiratory infection which is progressed into a COPD exacerbation.  He is already on a Z-Pak.  I will lengthen his steroids.  Resume prednisone 40 mg a day for 7 days and use albuterol 2 puffs every 4-6 hours as needed.  If shortness of breath worsens he is to go to the emergency room.  Meanwhile screen the patient for Covid.  I do not appreciate any rales or rhonchi on exam today to suggest underlying pneumonia.  I do recommend quarantining until Covid test has returned hopefully negative

## 2020-04-11 LAB — SARS-COV-2 RNA,(COVID-19) QUALITATIVE NAAT: SARS CoV2 RNA: NOT DETECTED

## 2020-04-26 ENCOUNTER — Ambulatory Visit: Payer: Medicare Other | Admitting: Cardiology

## 2020-05-04 ENCOUNTER — Telehealth: Payer: Self-pay | Admitting: Pharmacist

## 2020-05-04 NOTE — Progress Notes (Addendum)
Chronic Care Management Pharmacy Assistant   Name: James Branch  MRN: [id number] DOB: Nov 13, 1955  Reason for Encounter: Medication Review  PCP : [name], MD  Allergies:   Allergies  Allergen Reactions   Penicillins Swelling    Medications: Outpatient Encounter Medications as of 05/04/2020  Medication Sig   albuterol (VENTOLIN HFA) 108 (90 Base) MCG/ACT inhaler INHALE 2 PUFFS INTO THE LUNGS 4 (FOUR) TIMES DAILY.   aspirin EC 81 MG tablet Take 81 mg by mouth daily.   atorvastatin (LIPITOR) 20 MG tablet Take 1 tablet (20 mg total) by mouth daily.   benzonatate (TESSALON) 100 MG capsule Take 1 capsule (100 mg total) by mouth 3 (three) times daily as needed for cough. Take first dose at nighttime and monitor for drowsiness.  If this medication makes you drowsy, do not take while driving or operating heavy machinery.   betamethasone dipropionate 0.05 % cream APPLY TO AFFECTED AREA TWICE A DAY   cholecalciferol (VITAMIN D3) 25 MCG (1000 UT) tablet Take 1,000 Units by mouth daily.   furosemide (LASIX) 40 MG tablet TAKE 1 TABLET BY MOUTH EVERY DAY   guaiFENesin (MUCINEX) 600 MG 12 hr tablet Take 1 tablet (600 mg total) by mouth 2 (two) times daily as needed for cough or to loosen phlegm.   lisinopril (ZESTRIL) 20 MG tablet Take 1 tablet (20 mg total) by mouth daily.   metoprolol tartrate (LOPRESSOR) 25 MG tablet Take 1 tablet (25 mg total) by mouth 2 (two) times daily.   nitroGLYCERIN (NITROSTAT) 0.4 MG SL tablet PLACE 1 TABLET (0.4 MG TOTAL) UNDER THE TONGUE EVERY 5 (FIVE) MINUTES AS NEEDED FOR CHEST PAIN.   predniSONE (DELTASONE) 20 MG tablet Take 2 tablets (40 mg total) by mouth daily with breakfast.   No facility-administered encounter medications on file as of 05/04/2020.    Current Diagnosis: Patient Active Problem List   Diagnosis Date Noted   Upper respiratory tract infection 04/02/2020   Exertional dyspnea 09/05/2019   Mixed hyperlipidemia 02/20/2019   CAD  (coronary artery disease)    Morbid obesity ([place])    Smoker    OSA on CPAP    Diastolic dysfunction     Goals Addressed   None    Reviewed chart for medication changes ahead of medication coordination call.  No Consults, or hospital visits since last care coordination call.  Office Visits: 04/08/20 Dr. Dennard Schaumann for Cough. Advised to CONTUINE Prednisone and use Albuterol inhaler as needed.   No medication changes indicated.  BP Readings from Last 3 Encounters:  04/08/20 128/82  02/05/20 120/60  10/15/19 (!) 126/59    No results found for: HGBA1C   Patient obtains medications through Vials  90 Days   Last adherence delivery included: Albuterol Sulfate 90 mcg daily Atorvastatin 20 mg daily Furosemide 40 mg daily Metoprolol tartrate 25 mg twice daily  Lisinopril 20 mg daily  Patient declined the following meds last month: Albuterol Sulfate 90 mcg daily Atorvastatin 20 mg daily Furosemide 40 mg daily Metoprolol tartrate 25 mg twice daily  Lisinopril 20 mg daily Nitroglycerin 0.4mg  SL  Betamethasone Diproprionate Cr 0.05%   Patient is not due for an adherence delivery at this time.  Called patient and reviewed medications.  This delivery to include: No medications at this time - patient received a 90 days supply on 03/14/2020  Patient declined the following medications: Albuterol Sulfate 90 mcg daily (90 day on 03/14/20) Atorvastatin 20 mg daily (90 day on 03/14/20) Furosemide  40 mg daily (90 day on 03/14/20) Metoprolol tartrate 25 mg twice daily (90 day on 03/14/20) Lisinopril 20 mg daily (90 day on 03/14/20) Nitroglycerin 0.4mg  SL (PRN) Betamethasone Diproprionate Cr 0.05% (PRN)  Patient does not need any refills at this time.   Follow-Up:  Pharmacist Review   Charlann Lange, RMA Clinical Pharmacist Assistant [phone]  6 minutes spent in review, coordination, and documentation.  Reviewed by: Beverly Milch, PharmD Clinical Pharmacist [facility] [phone]

## 2020-05-24 DIAGNOSIS — R6889 Other general symptoms and signs: Secondary | ICD-10-CM | POA: Diagnosis not present

## 2020-05-27 ENCOUNTER — Ambulatory Visit: Payer: Medicare Other | Admitting: Physician Assistant

## 2020-06-04 ENCOUNTER — Ambulatory Visit: Payer: Medicare Other | Admitting: Cardiology

## 2020-06-04 ENCOUNTER — Telehealth: Payer: Self-pay | Admitting: Pharmacist

## 2020-06-04 NOTE — Progress Notes (Addendum)
Chronic Care Management Pharmacy Assistant   Name: James Branch  MRN: [id number] DOB: 1955-12-29  Reason for Encounter: Medication Review  PCP : [name], MD  Allergies:   Allergies  Allergen Reactions   Penicillins Swelling    Medications: Outpatient Encounter Medications as of 06/04/2020  Medication Sig   albuterol (VENTOLIN HFA) 108 (90 Base) MCG/ACT inhaler INHALE 2 PUFFS INTO THE LUNGS 4 (FOUR) TIMES DAILY.   aspirin EC 81 MG tablet Take 81 mg by mouth daily.   atorvastatin (LIPITOR) 20 MG tablet Take 1 tablet (20 mg total) by mouth daily.   benzonatate (TESSALON) 100 MG capsule Take 1 capsule (100 mg total) by mouth 3 (three) times daily as needed for cough. Take first dose at nighttime and monitor for drowsiness.  If this medication makes you drowsy, do not take while driving or operating heavy machinery.   betamethasone dipropionate 0.05 % cream APPLY TO AFFECTED AREA TWICE A DAY   cholecalciferol (VITAMIN D3) 25 MCG (1000 UT) tablet Take 1,000 Units by mouth daily.   furosemide (LASIX) 40 MG tablet TAKE 1 TABLET BY MOUTH EVERY DAY   guaiFENesin (MUCINEX) 600 MG 12 hr tablet Take 1 tablet (600 mg total) by mouth 2 (two) times daily as needed for cough or to loosen phlegm.   lisinopril (ZESTRIL) 20 MG tablet Take 1 tablet (20 mg total) by mouth daily.   metoprolol tartrate (LOPRESSOR) 25 MG tablet Take 1 tablet (25 mg total) by mouth 2 (two) times daily.   nitroGLYCERIN (NITROSTAT) 0.4 MG SL tablet PLACE 1 TABLET (0.4 MG TOTAL) UNDER THE TONGUE EVERY 5 (FIVE) MINUTES AS NEEDED FOR CHEST PAIN.   predniSONE (DELTASONE) 20 MG tablet Take 2 tablets (40 mg total) by mouth daily with breakfast.   No facility-administered encounter medications on file as of 06/04/2020.    Current Diagnosis: Patient Active Problem List   Diagnosis Date Noted   Upper respiratory tract infection 04/02/2020   Exertional dyspnea 09/05/2019   Mixed hyperlipidemia 02/20/2019   CAD  (coronary artery disease)    Morbid obesity ([place])    Smoker    OSA on CPAP    Diastolic dysfunction     Goals Addressed   None    Reviewed chart for medication changes ahead of medication coordination call.  No OVs, consults, hospital visits since last care coordination call.  No medication changes indicated.  BP Readings from Last 3 Encounters:  04/08/20 128/82  02/05/20 120/60  10/15/19 (!) 126/59    No results found for: HGBA1C   Patient obtains medications through Vials  90 Days   Last adherence delivery included: On 03/14/20 Albuterol Sulfate 90 mcg daily Atorvastatin 20 mg daily Furosemide 40 mg daily Metoprolol tartrate 25 mg twice daily  Lisinopril 20 mg daily  Patient declined (meds) last month: Albuterol Sulfate 90 mcg daily  Atorvastatin 20 mg daily  Furosemide 40 mg daily  Metoprolol tartrate 25 mg twice daily Lisinopril 20 mg daily  Nitroglycerin 0.4mg  SL (PRN)  Betamethasone Diproprionate Cr 0.05% (PRN)  Patient is due for next adherence delivery on: 06/11/20.  Called patient and reviewed medications and coordinated delivery.  This delivery to include: Albuterol Sulfate 90 mcg daily Atorvastatin 20 mg daily Furosemide 40 mg daily Metoprolol tartrate 25 mg twice daily  Lisinopril 20 mg daily  Patient declined the following medications: Nitroglycerin 0.4mg  SL (PRN)  Betamethasone Diproprionate Cr 0.05% (PRN)  Patient does not need any refills at this time.  Confirmed  delivery date of 06/11/20, advised patient that pharmacy will contact them the morning of delivery.   Follow-Up:  Coordination of Enhanced Pharmacy Services and Pharmacist Review   Charlann Lange, [place] Pharmacist Assistant [phone]  10 minutes spent in review, coordination, and documentation.  Reviewed by: Beverly Milch, PharmD Clinical Pharmacist [facility] [phone]

## 2020-06-08 ENCOUNTER — Ambulatory Visit: Payer: Medicare Other | Admitting: Gastroenterology

## 2020-06-08 ENCOUNTER — Encounter: Payer: Self-pay | Admitting: Gastroenterology

## 2020-06-08 VITALS — BP 118/64 | HR 76 | Ht 70.0 in | Wt 352.0 lb

## 2020-06-08 DIAGNOSIS — Z86010 Personal history of colonic polyps: Secondary | ICD-10-CM | POA: Diagnosis not present

## 2020-06-08 DIAGNOSIS — K429 Umbilical hernia without obstruction or gangrene: Secondary | ICD-10-CM

## 2020-06-08 DIAGNOSIS — K602 Anal fissure, unspecified: Secondary | ICD-10-CM

## 2020-06-08 DIAGNOSIS — K6289 Other specified diseases of anus and rectum: Secondary | ICD-10-CM

## 2020-06-08 MED ORDER — DILTIAZEM GEL 2 %: 1.0000 "application " | Freq: Three times a day (TID) | CUTANEOUS | 1 refills | Status: DC

## 2020-06-08 NOTE — Patient Instructions (Addendum)
We have sent a prescription for diltiazem gel to [facility]. You should apply a pea size amount to your rectum three times daily 8 weeks.  [facility]'s information is below: Address: [address], [place], [place] [address]  [phone]  *Please DO NOT go directly from our office to pick up this medication! Give the pharmacy 1 day to process the prescription as this is compounded and takes time to make.  RECTAL CARE INSTRUCTIONS:  1. Sitz Baths twice a day for 10 minutes each. 2. Thoroughly clean and dry the rectum. 3. Put Tucks pad against the rectum at night. 4. Clean the rectum with Balenol lotion after each bowel movement.   Thank you for choosing me and [facility].  Pricilla Riffle. Dagoberto Ligas., MD., [facility]

## 2020-06-08 NOTE — Progress Notes (Addendum)
    History of Present Illness: This is a 65 year old male with anal / rectal pain after bowel movements for several months.  He relates anal / rectal pain that starts with a bowel movement and lasts a few hours to all day long.  He will occasionally have a bowel movement without pain.  He has tried hemorrhoidal creams, RectiCare and stool softeners without consistent relief of symptoms.  Colonoscopy 02/2019 - Three 6 to 7 mm polyps in the sigmoid colon, in the descending colon and in the transverse colon, removed with a cold snare. Resected and retrieved. 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 - Moderate diverticulosis in the left colon. - Internal hemorrhoids. - The examination was otherwise normal on direct and retroflexion views.   Current Medications, Allergies, Past Medical History, Past Surgical History, Family History and Social History were reviewed in [employer] record.   Physical Exam: General: Well developed, well nourished, obese, no acute distress Head: Normocephalic and atraumatic Eyes: Sclerae anicteric, EOMI Ears: Normal auditory acuity Mouth: Not examined, mask on during Covid-19 pandemic Lungs: Clear throughout to auscultation Heart: Regular rate and rhythm; no murmurs, rubs or bruits Abdomen: Soft, large, non tender and non distended. No masses, hepatosplenomegaly noted. Umbilical hernia, reducible. Normal Bowel sounds Rectal: small anterior anal fissure, small skin tag, no other lesions, heme neg brown stool  Musculoskeletal: Symmetrical with no gross deformities  Pulses:  Normal pulses noted Extremities: No clubbing, cyanosis, edema or deformities noted Neurological: Alert oriented x 4, grossly nonfocal Psychological:  Alert and cooperative. Normal mood and affect   Assessment and Recommendations:  1. Anal fissure. Diltiazem 2% gel to anus tid for 8 weeks.  Colace daily.  Increase daily water intake.  Standard rectal care instructions. REV in 8  weeks.  2. Umbilical hernia.  Surgical referral after problem #1 resolved.  3.  Personal history of adenomatous colon polyps.  A 7-year interval surveillance colonoscopy is recommended in November 2027.

## 2020-07-05 ENCOUNTER — Telehealth: Payer: Self-pay | Admitting: Pharmacist

## 2020-07-05 ENCOUNTER — Encounter: Payer: Self-pay | Admitting: Cardiology

## 2020-07-05 ENCOUNTER — Other Ambulatory Visit: Payer: Self-pay

## 2020-07-05 ENCOUNTER — Ambulatory Visit: Payer: Medicare Other | Admitting: Cardiology

## 2020-07-05 VITALS — BP 141/63 | HR 58 | Resp 16 | Ht 70.0 in | Wt 349.0 lb

## 2020-07-05 DIAGNOSIS — I25118 Atherosclerotic heart disease of native coronary artery with other forms of angina pectoris: Secondary | ICD-10-CM

## 2020-07-05 DIAGNOSIS — R0609 Other forms of dyspnea: Secondary | ICD-10-CM | POA: Diagnosis not present

## 2020-07-05 DIAGNOSIS — E782 Mixed hyperlipidemia: Secondary | ICD-10-CM

## 2020-07-05 DIAGNOSIS — R9439 Abnormal result of other cardiovascular function study: Secondary | ICD-10-CM | POA: Insufficient documentation

## 2020-07-05 DIAGNOSIS — R0600 Dyspnea, unspecified: Secondary | ICD-10-CM

## 2020-07-05 NOTE — Progress Notes (Signed)
Patient referred by [name], MD for coronary artery calcification.  Subjective:   James Branch, male    DOB: 05-24-1955, 65 y.o.   MRN: [id number]   Chief Complaint  Patient presents with  . Coronary artery disease involving native coronary artery of  . Follow-up    HPI  65 y.o. Caucasian male with controlled hypertension, hyperlipidemia, tobacco dependence, CAD with exertional dyspnea, abnormal stress test, morbid obesity, venous insufficiency  Patient has stable, unchanged exertional dyspnea. Again, denies any chest pain.     Current Outpatient Medications on File Prior to Visit  Medication Sig Dispense Refill  . albuterol (VENTOLIN HFA) 108 (90 Base) MCG/ACT inhaler INHALE 2 PUFFS INTO THE LUNGS 4 (FOUR) TIMES DAILY. 8.5 each 11  . aspirin EC 81 MG tablet Take 81 mg by mouth daily.    Marland Kitchen atorvastatin (LIPITOR) 20 MG tablet Take 1 tablet (20 mg total) by mouth daily. 90 tablet 3  . betamethasone dipropionate 0.05 % cream APPLY TO AFFECTED AREA TWICE A DAY 45 g 3  . cholecalciferol (VITAMIN D3) 25 MCG (1000 UT) tablet Take 1,000 Units by mouth daily.    Marland Kitchen diltiazem 2 % GEL Apply 1 application topically 3 (three) times daily. X 8 weeks 30 g 1  . furosemide (LASIX) 40 MG tablet TAKE 1 TABLET BY MOUTH EVERY DAY 90 tablet 3  . lisinopril (ZESTRIL) 20 MG tablet Take 1 tablet (20 mg total) by mouth daily. 90 tablet 3  . metoprolol tartrate (LOPRESSOR) 25 MG tablet Take 1 tablet (25 mg total) by mouth 2 (two) times daily. 180 tablet 2  . nitroGLYCERIN (NITROSTAT) 0.4 MG SL tablet PLACE 1 TABLET (0.4 MG TOTAL) UNDER THE TONGUE EVERY 5 (FIVE) MINUTES AS NEEDED FOR CHEST PAIN. 25 tablet 2   No current facility-administered medications on file prior to visit.    Cardiovascular studies:  EKG 07/05/2020: Sinus rhythm 60 bpm Low voltage, otherwise normal EKG  Lexiscan Tetrofosmin stress test 09/08/2019: Lexiscan/modified Bruce nuclear stress test performed using 1-day  protocol. Stress EKG is non-diagnostic, as this is pharmacological stress test. In addition, stress EKG at 79% MPHR showed sinus tachycardia, <1 mm downsloping ST segment depression in inferior leads.  SPECT images show medium sized, medium intensity, reversible perfusion defect in mid to apical inferior myocardium. STress LVEF 70%. Intermediate risk study.    EKG 09/05/2019: Sinus rhythm 68 bpm Normal EKG   Ultrasound abdominal aorta 02/17/2019: Abdominal Aorta: No evidence of an abdominal aortic aneurysm was visualized. Very limited exam and unable to do a thorough exam of the aorta and iliac arteries.  Stenosis: Very limited exam. From what was seen there is no evidence of stenosis.  Can not rule out stenosis on segments not visualized.  CT chest 02/11/2019: 1. Lung-RADS 2S, benign appearance or behavior. Continue annual screening with low-dose chest CT without contrast in 12 months. 2. The "S" modifier above refers to potentially clinically significant non lung cancer related findings.  Specifically, there is aortic atherosclerosis, in addition to left main and 3 vessel coronary artery disease. Please note that although the presence of coronary artery calcium  documents the presence of coronary artery disease, the severity of this disease and any potential stenosis cannot be assessed on this non-gated CT examination. Assessment for potential risk factor modification,  dietary therapy or pharmacologic therapy may be warranted, if clinically indicated. 3. Mild diffuse bronchial wall thickening with very mild centrilobular and paraseptal emphysema;  imaging findings suggestive of underlying  COPD. 4. Hepatic steatosis.  Aortic Atherosclerosis and Emphysema   Recent labs: 02/02/2020: Glucose 99, BUN/Cr 13/0.79. EGFR 94. Na/K 138/4.6. Rest of the CMP normal H/H 15/47. MCV 93. Platelets 267 HbA1C N/A% Chol 129, TG 135, HDL 45, LDL 62 TSH N/A  01/31/2019: Glucose 106, BUN/Cr 25/1.  EGFR 79. Na/K 141/4.4. Rest of the CMP normal H/H 15/44. MCV 93. Platelets 243 Chol 127, TG 93, HDL 46, LDL 63.   Review of Systems  Cardiovascular: Negative for chest pain, dyspnea on exertion, leg swelling, palpitations and syncope.         Vitals:   07/05/20 1437  BP: (!) 141/63  Pulse: (!) 58  Resp: 16  SpO2: 98%     Body mass index is 50.08 kg/m. Filed Weights   07/05/20 1437  Weight: (!) 349 lb (158.3 kg)     Objective:   Physical Exam Vitals and nursing note reviewed.  Constitutional:      General: He is not in acute distress.    Comments: Morbidly obese  Neck:     Vascular: No JVD.  Cardiovascular:     Rate and Rhythm: Normal rate and regular rhythm.     Pulses: Intact distal pulses.     Heart sounds: Normal heart sounds. No murmur heard.   Pulmonary:     Effort: Pulmonary effort is normal.     Breath sounds: Normal breath sounds. No wheezing or rales.           Assessment & Recommendations:   64 y.o. Caucasian male with controlled hypertension, hyperlipidemia, tobacco dependence, CAD with exertional dyspnea, abnormal stress test, morbid obesity, venous insufficiency  CAD: Seen on CT chest with left main and triple vessel atherosclerosis. Stable unchanged exertional dyspnea, which could be multifactorial due to obesity and CAD. Patient would like to continue medical management at this time.  He has an upcoming circumcision surgery.  If surgery is performed under local anesthesia, it should be low risk.  If it will be performed under general anesthesia, could consider coronary angiography for stratification.  Patient will let me know. Continue Aspirin, stain  Hypertension: Controlled  F/u in 6 months.   [name], MD [facility] Pager: [phone] Office: [phone]

## 2020-07-05 NOTE — Progress Notes (Addendum)
° ° °  Chronic Care Management Pharmacy Assistant   Name: SHADE KALEY  MRN: [id number] DOB: 1956-02-06  Reason for Encounter: Medication Review  Medications: Outpatient Encounter Medications as of 07/05/2020  Medication Sig   albuterol (VENTOLIN HFA) 108 (90 Base) MCG/ACT inhaler INHALE 2 PUFFS INTO THE LUNGS 4 (FOUR) TIMES DAILY.   aspirin EC 81 MG tablet Take 81 mg by mouth daily.   atorvastatin (LIPITOR) 20 MG tablet Take 1 tablet (20 mg total) by mouth daily.   betamethasone dipropionate 0.05 % cream APPLY TO AFFECTED AREA TWICE A DAY   cholecalciferol (VITAMIN D3) 25 MCG (1000 UT) tablet Take 1,000 Units by mouth daily.   diltiazem 2 % GEL Apply 1 application topically 3 (three) times daily. X 8 weeks   furosemide (LASIX) 40 MG tablet TAKE 1 TABLET BY MOUTH EVERY DAY   lisinopril (ZESTRIL) 20 MG tablet Take 1 tablet (20 mg total) by mouth daily.   metoprolol tartrate (LOPRESSOR) 25 MG tablet Take 1 tablet (25 mg total) by mouth 2 (two) times daily.   nitroGLYCERIN (NITROSTAT) 0.4 MG SL tablet PLACE 1 TABLET (0.4 MG TOTAL) UNDER THE TONGUE EVERY 5 (FIVE) MINUTES AS NEEDED FOR CHEST PAIN.   No facility-administered encounter medications on file as of 07/05/2020.   Reviewed chart for medication changes ahead of medication coordination call.  No OVs, hospital visits since last care coordination call.  Consults: 06/08/20 Mallie Snooks, Pricilla Riffle, MD. For rectal pain. STARTED Diltiazem HCL 2% TID for 8 weeks. STOPPED Benzonatate, guaifenesin, and prednisone.    BP Readings from Last 3 Encounters:  06/08/20 118/64  04/08/20 128/82  02/05/20 120/60    No results found for: HGBA1C   Patient obtains medications through Vials  90 Days   Last adherence delivery included:  Albuterol Sulfate 90 mcg daily Atorvastatin 20 mg daily Furosemide 40 mg daily Metoprolol tartrate 25 mg twice daily  Lisinopril 20 mg daily  Patient declined meds last month: Nitroglycerin 0.4mg  SL (PRN)   Betamethasone Diproprionate Cr 0.05% (PRN)  Patient is not due for an adherence delivery at this time.  Called patient and reviewed medications and coordinated delivery.  This delivery to include: None  Patient declined the following medications: Albuterol Sulfate 90 mcg daily  Atorvastatin 20 mg daily  Furosemide 40 mg daily  Metoprolol tartrate 25 mg twice daily Lisinopril 20 mg daily  Nitroglycerin 0.4mg  SL (PRN)  Betamethasone Diproprionate Cr 0.05% (PRN)  Patient does not need refills at this time.    Follow-Up: Pharmacist Review  Charlann Lange, RMA Clinical Pharmacist Assistant [phone]  5 minutes spent in review, coordination, and documentation.  Reviewed by: Beverly Milch, PharmD Clinical Pharmacist [facility] [phone]

## 2020-07-06 ENCOUNTER — Encounter: Payer: Self-pay | Admitting: Cardiology

## 2020-07-26 ENCOUNTER — Encounter: Payer: Self-pay | Admitting: Family Medicine

## 2020-07-26 ENCOUNTER — Other Ambulatory Visit: Payer: Self-pay | Admitting: Family Medicine

## 2020-07-26 DIAGNOSIS — K429 Umbilical hernia without obstruction or gangrene: Secondary | ICD-10-CM

## 2020-07-30 ENCOUNTER — Telehealth: Payer: Self-pay | Admitting: Pharmacist

## 2020-07-30 NOTE — Progress Notes (Addendum)
    Chronic Care Management Pharmacy Assistant   Name: James Branch  MRN: [id number] DOB: 10-Apr-1956  Reason for Encounter: Medication Review/Medication Coordination Call.  Medications: Outpatient Encounter Medications as of 07/30/2020  Medication Sig   albuterol (VENTOLIN HFA) 108 (90 Base) MCG/ACT inhaler INHALE 2 PUFFS INTO THE LUNGS 4 (FOUR) TIMES DAILY.   aspirin EC 81 MG tablet Take 81 mg by mouth daily.   atorvastatin (LIPITOR) 20 MG tablet Take 1 tablet (20 mg total) by mouth daily.   betamethasone dipropionate 0.05 % cream APPLY TO AFFECTED AREA TWICE A DAY   cholecalciferol (VITAMIN D3) 25 MCG (1000 UT) tablet Take 1,000 Units by mouth daily.   diltiazem 2 % GEL Apply 1 application topically 3 (three) times daily. X 8 weeks   furosemide (LASIX) 40 MG tablet TAKE 1 TABLET BY MOUTH EVERY DAY   lisinopril (ZESTRIL) 20 MG tablet Take 1 tablet (20 mg total) by mouth daily.   metoprolol tartrate (LOPRESSOR) 25 MG tablet Take 1 tablet (25 mg total) by mouth 2 (two) times daily.   nitroGLYCERIN (NITROSTAT) 0.4 MG SL tablet PLACE 1 TABLET (0.4 MG TOTAL) UNDER THE TONGUE EVERY 5 (FIVE) MINUTES AS NEEDED FOR CHEST PAIN.   No facility-administered encounter medications on file as of 07/30/2020.    Reviewed chart for medication changes ahead of medication coordination call.  No OVs or hospital visits since last care coordination call.  Consults Visits: 07/05/20 Cardiology Patwardhan, Reynold Bowen, MD. For coronary artery disease. No medication changes.  No medication changes indicated.  BP Readings from Last 3 Encounters:  07/05/20 (!) 141/63  06/08/20 118/64  04/08/20 128/82    No results found for: HGBA1C   Patient obtains medications through Vials  90 Days   Last adherence delivery included: (90 DS 2/25) Albuterol Sulfate 90 mcg daily Atorvastatin 20 mg daily Furosemide 40 mg daily Metoprolol tartrate 25 mg twice daily  Lisinopril 20 mg daily  Patient declined meds last  month: Albuterol Sulfate 90 mcg daily  Atorvastatin 20 mg daily  Furosemide 40 mg daily  Metoprolol tartrate 25 mg twice daily Lisinopril 20 mg daily  Nitroglycerin 0.4mg  SL (PRN)  Betamethasone Diproprionate Cr 0.05% (PRN)  Patient is not due for an adherence delivery at this time.  This delivery to include: None  Patient declined the following medications: Albuterol Sulfate 90 mcg daily  Atorvastatin 20 mg daily  Furosemide 40 mg daily  Metoprolol tartrate 25 mg twice daily Lisinopril 20 mg daily  Nitroglycerin 0.4mg  SL (PRN)  Betamethasone Diproprionate Cr 0.05% (PRN)  Patient does not needs refills at this time.    Follow-Up:Pharmacist Review  Charlann Lange, RMA Clinical Pharmacist Assistant [phone]  6 minutes spent in review, coordination, and documentation.  Reviewed by: Beverly Milch, PharmD Clinical Pharmacist [facility] [phone]

## 2020-08-10 ENCOUNTER — Encounter: Payer: Self-pay | Admitting: Gastroenterology

## 2020-08-10 ENCOUNTER — Ambulatory Visit: Payer: Medicare Other | Admitting: Gastroenterology

## 2020-08-10 VITALS — BP 120/68 | HR 56 | Ht 70.0 in | Wt 345.0 lb

## 2020-08-10 DIAGNOSIS — K602 Anal fissure, unspecified: Secondary | ICD-10-CM

## 2020-08-10 DIAGNOSIS — Z86010 Personal history of colonic polyps: Secondary | ICD-10-CM | POA: Diagnosis not present

## 2020-08-10 DIAGNOSIS — K429 Umbilical hernia without obstruction or gangrene: Secondary | ICD-10-CM

## 2020-08-10 MED ORDER — DILTIAZEM GEL 2 %
1.0000 | Freq: Three times a day (TID) | CUTANEOUS | 1 refills | Status: DC
Start: 2020-08-10 — End: 2021-02-10

## 2020-08-10 NOTE — Patient Instructions (Signed)
We have sent a prescription for diltiazem gel to [facility]. You should apply a pea size amount to your rectum three times daily x 4-6 more weeks.  [facility]'s information is below: Address: [address], [place], [place] [address]  [phone]  *Please DO NOT go directly from our office to pick up this medication! Give the pharmacy 1 day to process the prescription as this is compounded and takes time to make.  Call back if your rectal pain comes back.  Thank you for choosing me and [facility].  Pricilla Riffle. Dagoberto Ligas., MD., [facility] Regal

## 2020-08-10 NOTE — Progress Notes (Signed)
    History of Present Illness: This is a 65 year old male returning for follow-up of an anal fissure.  Anal fissure diagnosed on February 22 and he was treated with 2% diltiazem gel tid, and stool softeners. For the past 2 weeks he has noted only 1 day with anal / rectal pain following a bowel movement.   Current Medications, Allergies, Past Medical History, Past Surgical History, Family History and Social History were reviewed in [employer] record.   Physical Exam: General: Well developed, well nourished, no acute distress Head: Normocephalic and atraumatic Eyes: Sclerae anicteric, EOMI Ears: Normal auditory acuity Mouth: Not examined, mask on during Covid-19 pandemic Lungs: Clear throughout to auscultation Heart: Regular rate and rhythm; no murmurs, rubs or bruits Abdomen: Soft, non tender and non distended.  Umbilical hernia, reducible.  No masses, hepatosplenomegaly noted. Normal Bowel sounds Rectal:  Not done Musculoskeletal: Symmetrical with no gross deformities  Pulses:  Normal pulses noted Extremities: No clubbing, cyanosis, edema or deformities noted Neurological: Alert oriented x 4, grossly nonfocal Psychological:  Alert and cooperative. Normal mood and affect   Assessment and Recommendations:  1. Anal fissure, resolving.  His symptoms have substantially improved especially over the past 2 weeks.  He is advised to continue 2% diltiazem gel 3 times daily for 4-6 more weeks even if completely asymptomatic.  Continue daily stool softener.  He is advised to call if his symptoms do not completely resolve.  2. Umbilical hernia. Surgical consult is scheduled in May.   3. Personal history of adenomatous colon polyps.  Surveillance colonoscopy is recommended in November 2027.

## 2020-08-27 ENCOUNTER — Telehealth: Payer: Self-pay | Admitting: *Deleted

## 2020-08-27 ENCOUNTER — Telehealth: Payer: Self-pay | Admitting: Pharmacist

## 2020-08-27 MED ORDER — BETAMETHASONE DIPROPIONATE 0.05 % EX CREA
TOPICAL_CREAM | CUTANEOUS | 3 refills | Status: DC
Start: 2020-08-27 — End: 2020-11-22

## 2020-08-27 NOTE — Telephone Encounter (Signed)
-----   Message from Rosetta Posner sent at 08/27/2020  2:49 PM EDT ----- Regarding: Medication Refill Patient is requesting for a refill for Betamethasone Diproprionate Cr 0.05% be sent to [facility].

## 2020-08-27 NOTE — Progress Notes (Addendum)
    Chronic Care Management Pharmacy Assistant   Name: James Branch  MRN: [id number] DOB: 1955-11-18  Reason for Encounter: Medication Review/Medication Coordination Call  Medications: Outpatient Encounter Medications as of 08/27/2020  Medication Sig   albuterol (VENTOLIN HFA) 108 (90 Base) MCG/ACT inhaler INHALE 2 PUFFS INTO THE LUNGS 4 (FOUR) TIMES DAILY.   aspirin EC 81 MG tablet Take 81 mg by mouth daily.   atorvastatin (LIPITOR) 20 MG tablet Take 1 tablet (20 mg total) by mouth daily.   betamethasone dipropionate 0.05 % cream APPLY TO AFFECTED AREA TWICE A DAY   cholecalciferol (VITAMIN D3) 25 MCG (1000 UT) tablet Take 1,000 Units by mouth daily.   diltiazem 2 % GEL Apply 1 application topically 3 (three) times daily. X 8 weeks   furosemide (LASIX) 40 MG tablet TAKE 1 TABLET BY MOUTH EVERY DAY   lisinopril (ZESTRIL) 20 MG tablet Take 1 tablet (20 mg total) by mouth daily.   metoprolol tartrate (LOPRESSOR) 25 MG tablet Take 1 tablet (25 mg total) by mouth 2 (two) times daily.   nitroGLYCERIN (NITROSTAT) 0.4 MG SL tablet PLACE 1 TABLET (0.4 MG TOTAL) UNDER THE TONGUE EVERY 5 (FIVE) MINUTES AS NEEDED FOR CHEST PAIN.   No facility-administered encounter medications on file as of 08/27/2020.    Reviewed chart for medication changes ahead of medication coordination call.  No OVs, hospital visits since last care coordination call  Consults Visit: 08/10/20 Mallie Snooks, Pricilla Riffle, MD. Per note: Diltiazem gel sent to the pharmacy.   BP Readings from Last 3 Encounters:  08/10/20 120/68  07/05/20 (!) 141/63  06/08/20 118/64    No results found for: HGBA1C   Patient obtains medications through Vials  90 Days   Last adherence delivery included: (90 DS 2/25) Albuterol Sulfate 90 mcg daily Atorvastatin 20 mg daily Furosemide 40 mg daily Metoprolol tartrate 25 mg twice daily  Lisinopril 20 mg daily  Patient declined meds last month: Albuterol Sulfate 90 mcg daily  Atorvastatin  20 mg daily  Furosemide 40 mg daily  Metoprolol tartrate 25 mg twice daily Lisinopril 20 mg daily  Nitroglycerin 0.4mg  SL (PRN)  Betamethasone Diproprionate Cr 0.05% (PRN)  Patient is due for next adherence delivery on: 09/02/20.  Called patient and reviewed medications and coordinated delivery.  This delivery to include: Albuterol Sulfate 90 mcg daily Atorvastatin 20 mg daily Furosemide 40 mg daily Metoprolol tartrate 25 mg twice daily  Lisinopril 20 mg daily Betamethasone Diproprionate Cr 0.05% (PRN)  Patient declined the following medications: Nitroglycerin 0.4mg  SL (PRN)   Patient needs refills for Betamethasone Diproprionate Cr 0.05% (PRN)- requested.  Confirmed delivery date of 09/02/20, advised patient that pharmacy will contact them the morning of delivery.   Follow-Up:Pharmacist Review  Charlann Lange, RMA Clinical Pharmacist Assistant [phone] 10 minutes spent in review, coordination, and documentation.  Reviewed by: Beverly Milch, PharmD Clinical Pharmacist [facility] [phone]

## 2020-08-31 ENCOUNTER — Other Ambulatory Visit: Payer: Self-pay | Admitting: Surgery

## 2020-08-31 DIAGNOSIS — J449 Chronic obstructive pulmonary disease, unspecified: Secondary | ICD-10-CM | POA: Diagnosis not present

## 2020-08-31 DIAGNOSIS — K420 Umbilical hernia with obstruction, without gangrene: Secondary | ICD-10-CM | POA: Diagnosis not present

## 2020-09-01 ENCOUNTER — Telehealth: Payer: Self-pay | Admitting: Cardiology

## 2020-09-01 DIAGNOSIS — I25118 Atherosclerotic heart disease of native coronary artery with other forms of angina pectoris: Secondary | ICD-10-CM

## 2020-09-01 DIAGNOSIS — R9439 Abnormal result of other cardiovascular function study: Secondary | ICD-10-CM

## 2020-09-01 NOTE — Telephone Encounter (Signed)
Received preop cardiac clearance letter for this patient from Dr. Ninfa Linden.  Patient is going to undergo umbilical hernia repair with mesh surgery in the near future, under general anesthesia.  Patient is known to have coronary artery disease with calcification seen on CT scan and left main as well as all 3 major vessels.  Nuclear stress test and May 2021 showed medium sized, medium intensity, reversible perfusion defect in mid to apical inferior myocardium-intermediate risk study.  Patient has stable baseline exertional dyspnea symptoms.  Before undergoing surgery under general anesthesia, I recommend coronary angiogram for restratification.  If severe left main stenosis noted, his risk would be elevated to undergo general anesthesia.  For any known left main stenosis, the risk would likely be acceptable.  Patient would like to schedule coronary angiogram in the next few weeks.  Risks benefits, alternate options discussed with the patient.   [name], MD Pager: [phone] Office: [phone]

## 2020-09-01 NOTE — Addendum Note (Signed)
Addended by: [name] on: 09/01/2020 09:46 AM   Modules accepted: Orders

## 2020-09-23 ENCOUNTER — Telehealth (INDEPENDENT_AMBULATORY_CARE_PROVIDER_SITE_OTHER): Payer: Medicare Other | Admitting: Nurse Practitioner

## 2020-09-23 ENCOUNTER — Encounter: Payer: Self-pay | Admitting: Nurse Practitioner

## 2020-09-23 DIAGNOSIS — R9439 Abnormal result of other cardiovascular function study: Secondary | ICD-10-CM | POA: Diagnosis not present

## 2020-09-23 DIAGNOSIS — J069 Acute upper respiratory infection, unspecified: Secondary | ICD-10-CM | POA: Diagnosis not present

## 2020-09-23 DIAGNOSIS — I25118 Atherosclerotic heart disease of native coronary artery with other forms of angina pectoris: Secondary | ICD-10-CM | POA: Diagnosis not present

## 2020-09-23 MED ORDER — PREDNISONE 20 MG PO TABS: 40.0000 mg | ORAL_TABLET | Freq: Every day | ORAL | 0 refills | Status: AC

## 2020-09-23 MED ORDER — SALINE SPRAY 0.65 % NA SOLN: 1.0000 | NASAL | 0 refills | Status: DC | PRN

## 2020-09-23 MED ORDER — GUAIFENESIN ER 600 MG PO TB12: 600.0000 mg | ORAL_TABLET | Freq: Two times a day (BID) | ORAL | 0 refills | Status: DC | PRN

## 2020-09-23 NOTE — Progress Notes (Signed)
Subjective:    Patient ID: James Branch, male    DOB: [birth year], 65 y.o.   MRN: [id number]  HPI: James Branch is a 65 y.o. male presenting virtually for cough.  Chief Complaint  Patient presents with   Cough    Coughing and fever since Friday while on road trip, taking cough syrup robitussin.  Fever has come down to 100 from 102,  some wheezing, congestion, taking flonase for congestion. Has heart cath schedule for this Tues. Took 2 home test both neg   UPPER RESPIRATORY TRACT INFECTION Biggest thing is chest congestion.   Onset: 6 days ago COVID-19 testing history: tested negative twice at home COVID-19 vaccination status: 2 with 1 booster Fever: yes TMAX 102 Saturday/Sunday; Monday 100, no fever since  Cough: yes - dry Shortness of breath: yes - especially with activity; albuterol once every 4 hours Wheezing: yes Chest pain: no Chest tightness: no Chest congestion: yes Nasal congestion: yes Runny nose: no Post nasal drip: no Sneezing: no Sore throat: no Swollen glands: no Sinus pressure: no Headache: no Face pain: no Toothache: no Ear pain: no  Ear pressure: no  Eyes red/itching:no Eye drainage/crusting: no  Nausea: no  Vomiting: no Diarrhea: yes  Change in appetite: yes; decreased Loss of taste/smell: no  Rash: no Fatigue: yes Sick contacts: no Strep contacts: no  Context: better Recurrent sinusitis: no Treatments attempted: Robitussin, flonase,  Relief with OTC medications:  Allergies  Allergen Reactions   Penicillins Swelling    Outpatient Encounter Medications as of 09/23/2020  Medication Sig   albuterol (VENTOLIN HFA) 108 (90 Base) MCG/ACT inhaler INHALE 2 PUFFS INTO THE LUNGS 4 (FOUR) TIMES DAILY. (Patient taking differently: Inhale 2 puffs into the lungs every 6 (six) hours as needed for wheezing or shortness of breath. INHALE 2 PUFFS INTO THE LUNGS 4 (FOUR) TIMES DAILY.)   aspirin EC 81 MG tablet Take 81 mg by mouth daily.   atorvastatin  (LIPITOR) 20 MG tablet Take 1 tablet (20 mg total) by mouth daily.   betamethasone dipropionate 0.05 % cream APPLY TO AFFECTED AREA TWICE A DAY (Patient taking differently: Apply 1 application topically 2 (two) times daily as needed (irritation).)   cholecalciferol (VITAMIN D3) 25 MCG (1000 UT) tablet Take 1,000 Units by mouth daily.   diltiazem 2 % GEL Apply 1 application topically 3 (three) times daily. X 8 weeks   furosemide (LASIX) 40 MG tablet TAKE 1 TABLET BY MOUTH EVERY DAY (Patient taking differently: Take 40 mg by mouth daily.)   guaiFENesin (MUCINEX) 600 MG 12 hr tablet Take 1 tablet (600 mg total) by mouth 2 (two) times daily as needed for cough or to loosen phlegm.   lisinopril (ZESTRIL) 20 MG tablet Take 1 tablet (20 mg total) by mouth daily.   metoprolol tartrate (LOPRESSOR) 25 MG tablet Take 25 mg by mouth 2 (two) times daily.   nitroGLYCERIN (NITROSTAT) 0.4 MG SL tablet Place 0.4 mg under the tongue every 5 (five) minutes as needed for chest pain.   Omega-3 Fatty Acids (FISH OIL) 1000 MG CAPS Take 1,000 mg by mouth 3 (three) times a week.   predniSONE (DELTASONE) 20 MG tablet Take 2 tablets (40 mg total) by mouth daily with breakfast for 5 days.   sodium chloride (OCEAN) 0.65 % SOLN nasal spray Place 1 spray into both nostrils as needed for congestion.   No facility-administered encounter medications on file as of 09/23/2020.    Patient Active Problem List  Diagnosis Date Noted   Abnormal stress test 07/05/2020   Upper respiratory tract infection 04/02/2020   Exertional dyspnea 09/05/2019   Mixed hyperlipidemia 02/20/2019   Coronary artery disease involving native coronary artery of native heart without angina pectoris    Bilateral sensorineural hearing loss 01/15/2017   Morbid obesity ([place])    Smoker    OSA on CPAP    Diastolic dysfunction     Past Medical History:  Diagnosis Date   Arthritis    knees   Asthma    CAD (coronary artery disease)    COPD (chronic  obstructive pulmonary disease) (HCC)    Diastolic dysfunction    GERD (gastroesophageal reflux disease)    Hyperlipidemia    Hypertension    Obesity    OSA on CPAP    ahi-84, 18 CWP   Sleep apnea    wears cpap    Smoker    Umbilical hernia     Relevant past medical, surgical, family and social history reviewed and updated as indicated. Interim medical history since our last visit reviewed.  Review of Systems Per HPI unless specifically indicated above     Objective:    There were no vitals taken for this visit.  Wt Readings from Last 3 Encounters:  08/10/20 (!) 345 lb (156.5 kg)  07/05/20 (!) 349 lb (158.3 kg)  06/08/20 (!) 352 lb (159.7 kg)    Physical Exam Unable to perform physical examination due to lack of equipment.  Patient talking in complete sentences during telemedicine visit.  Results for orders placed or performed in visit on 04/08/20  SARS-COV-2 RNA,(COVID-19) QUAL NAAT   Specimen: Respiratory  Result Value Ref Range   SARS CoV2 RNA NOT DETECTED NOT DETECT      Assessment & Plan:  1. Upper respiratory tract infection, unspecified type Acute.  Encouraged viral testing.  Given smoking history and wheezing, concern for bronchitis.  Start prednisone burst.  Reassured patient that symptoms and exam findings are most consistent with a viral upper respiratory infection and explained lack of efficacy of antibiotics against viruses.  Discussed expected course and features suggestive of secondary bacterial infection.  Continue supportive care. Increase fluid intake with water or electrolyte solution like pedialyte. Encouraged acetaminophen as needed for fever/pain. Encouraged salt water gargling, chloraseptic spray and throat lozenges. Encouraged OTC guaifenesin. Encouraged saline sinus flushes and/or neti with humidified air.    - SARS-CoV-2 RNA (COVID-19) and Respiratory Viral Panel, Qualitative NAAT - guaiFENesin (MUCINEX) 600 MG 12 hr tablet; Take 1 tablet (600 mg  total) by mouth 2 (two) times daily as needed for cough or to loosen phlegm.  Dispense: 30 tablet; Refill: 0 - predniSONE (DELTASONE) 20 MG tablet; Take 2 tablets (40 mg total) by mouth daily with breakfast for 5 days.  Dispense: 10 tablet; Refill: 0 - sodium chloride (OCEAN) 0.65 % SOLN nasal spray; Place 1 spray into both nostrils as needed for congestion.  Dispense: 88 mL; Refill: 0     Follow up plan: Return if symptoms worsen or fail to improve.   This visit was completed via telephone due to the restrictions of the COVID-19 pandemic. All issues as above were discussed and addressed but no physical exam was performed. If it was felt that the patient should be evaluated in the office, they were directed there. The patient verbally consented to this visit. Patient was unable to complete an audio/visual visit due to Technical difficulties.  Caregility error stating "error invalid data."  Tried numerous times.  Location of the patient: home Location of the provider: work Those involved with this call:  Provider: Noemi Chapel, DNP, FNP-C CMA: Annabelle Harman, CMA Front Desk/Registration: Vevelyn Pat  Time spent on call:  15 minutes on the phone discussing health concerns. 13 minutes total spent in review of patient's record and preparation of their chart. I verified patient identity using two factors (patient name and date of birth). Patient consents verbally to being seen via telemedicine visit today.

## 2020-09-24 LAB — CBC
Hematocrit: 43.4 % (ref 37.5–51.0)
Hemoglobin: 14.9 g/dL (ref 13.0–17.7)
MCH: 31.8 pg (ref 26.6–33.0)
MCHC: 34.3 g/dL (ref 31.5–35.7)
MCV: 93 fL (ref 79–97)
Platelets: 234 10*3/uL (ref 150–450)
RBC: 4.69 x10E6/uL (ref 4.14–5.80)
RDW: 13.1 % (ref 11.6–15.4)
WBC: 6.1 10*3/uL (ref 3.4–10.8)

## 2020-09-24 LAB — BASIC METABOLIC PANEL
BUN/Creatinine Ratio: 13 (ref 10–24)
BUN: 12 mg/dL (ref 8–27)
CO2: 23 mmol/L (ref 20–29)
Calcium: 9.3 mg/dL (ref 8.6–10.2)
Chloride: 102 mmol/L (ref 96–106)
Creatinine, Ser: 0.92 mg/dL (ref 0.76–1.27)
Glucose: 88 mg/dL (ref 65–99)
Potassium: 4.4 mmol/L (ref 3.5–5.2)
Sodium: 141 mmol/L (ref 134–144)
eGFR: 91 mL/min/{1.73_m2} (ref >59)

## 2020-09-26 LAB — SARS-COV-2 RNA (COVID-19) RESP VIRAL PNL QL NAAT

## 2020-09-27 ENCOUNTER — Telehealth: Payer: Self-pay | Admitting: *Deleted

## 2020-09-27 MED ORDER — SODIUM CHLORIDE 0.9 % IV SOLN: 250.0000 mL | INTRAVENOUS | Status: DC | PRN

## 2020-09-27 MED ORDER — SODIUM CHLORIDE 0.9% FLUSH: 3.0000 mL | Freq: Two times a day (BID) | INTRAVENOUS | Status: DC

## 2020-09-27 MED ORDER — SODIUM CHLORIDE 0.9 % WEIGHT BASED INFUSION: 3.0000 mL/kg/h | INTRAVENOUS | Status: DC

## 2020-09-27 MED ORDER — SODIUM CHLORIDE 0.9% FLUSH: 3.0000 mL | INTRAVENOUS | Status: DC | PRN

## 2020-09-27 MED ORDER — NIRMATRELVIR/RITONAVIR (PAXLOVID)TABLET: 3.0000 | ORAL_TABLET | Freq: Two times a day (BID) | ORAL | 0 refills | Status: AC

## 2020-09-27 MED ORDER — ASPIRIN 81 MG PO CHEW: 81.0000 mg | CHEWABLE_TABLET | ORAL | Status: AC

## 2020-09-27 MED ORDER — SODIUM CHLORIDE 0.9 % WEIGHT BASED INFUSION: 1.0000 mL/kg/h | INTRAVENOUS | Status: DC

## 2020-09-27 NOTE — Telephone Encounter (Signed)
Patient tested positive for COVID on 09/26/2020.   Sx include cough, congestion and nasal drainage.  Advised to continue symptom management with OTC medications: Tylenol/ Ibuprofen for fever/ body aches, Mucinex/ Delsym for cough/ chest congestion, Afrin/Sudafed/nasal saline for sinus pressure/ nasal congestion  GFR 91. Order for Paxlovid sent to pharmacy. Advised possible SE include nausea, diarrhea, loss of taste and hypertension.  If chest pain, shortness of breath, fever >104 that is unresponsive to antipyretics noted, advised to go to ER for evaluation.   Advised that the CDC recommends the following criteria prior to ending isolation in vaccinated persons at least 5 days since symptoms onset AND 3 days fever free without antipyretics (Tylenol or Ibuprofen) AND improvement in respiratory symptoms.

## 2020-09-27 NOTE — Telephone Encounter (Signed)
From pt

## 2020-10-12 ENCOUNTER — Encounter (HOSPITAL_COMMUNITY)
Admission: RE | Disposition: A | Discharge status: Home / Self Care | Payer: Self-pay | Source: Home / Self Care | Attending: Cardiology

## 2020-10-12 ENCOUNTER — Ambulatory Visit (HOSPITAL_COMMUNITY)
Admission: RE | Admit: 2020-10-12 | Discharge: 2020-10-12 | Disposition: A | Discharge status: Home / Self Care | Payer: Medicare Other | Attending: Cardiology | Admitting: Cardiology

## 2020-10-12 DIAGNOSIS — Z6841 Body Mass Index (BMI) 40.0 and over, adult: Secondary | ICD-10-CM | POA: Insufficient documentation

## 2020-10-12 DIAGNOSIS — Z7982 Long term (current) use of aspirin: Secondary | ICD-10-CM | POA: Diagnosis not present

## 2020-10-12 DIAGNOSIS — Z01818 Encounter for other preprocedural examination: Secondary | ICD-10-CM | POA: Diagnosis not present

## 2020-10-12 DIAGNOSIS — J449 Chronic obstructive pulmonary disease, unspecified: Secondary | ICD-10-CM | POA: Insufficient documentation

## 2020-10-12 DIAGNOSIS — I2584 Coronary atherosclerosis due to calcified coronary lesion: Secondary | ICD-10-CM | POA: Diagnosis not present

## 2020-10-12 DIAGNOSIS — Z87891 Personal history of nicotine dependence: Secondary | ICD-10-CM | POA: Insufficient documentation

## 2020-10-12 DIAGNOSIS — I872 Venous insufficiency (chronic) (peripheral): Secondary | ICD-10-CM | POA: Diagnosis not present

## 2020-10-12 DIAGNOSIS — E785 Hyperlipidemia, unspecified: Secondary | ICD-10-CM | POA: Diagnosis not present

## 2020-10-12 DIAGNOSIS — Z006 Encounter for examination for normal comparison and control in clinical research program: Secondary | ICD-10-CM

## 2020-10-12 DIAGNOSIS — I25118 Atherosclerotic heart disease of native coronary artery with other forms of angina pectoris: Secondary | ICD-10-CM | POA: Diagnosis not present

## 2020-10-12 DIAGNOSIS — Z79899 Other long term (current) drug therapy: Secondary | ICD-10-CM | POA: Insufficient documentation

## 2020-10-12 DIAGNOSIS — R0609 Other forms of dyspnea: Secondary | ICD-10-CM

## 2020-10-12 DIAGNOSIS — I10 Essential (primary) hypertension: Secondary | ICD-10-CM | POA: Diagnosis not present

## 2020-10-12 DIAGNOSIS — R9439 Abnormal result of other cardiovascular function study: Secondary | ICD-10-CM | POA: Diagnosis present

## 2020-10-12 DIAGNOSIS — I2510 Atherosclerotic heart disease of native coronary artery without angina pectoris: Secondary | ICD-10-CM

## 2020-10-12 DIAGNOSIS — Z880 Allergy status to penicillin: Secondary | ICD-10-CM | POA: Insufficient documentation

## 2020-10-12 DIAGNOSIS — R0600 Dyspnea, unspecified: Secondary | ICD-10-CM

## 2020-10-12 HISTORY — PX: LEFT HEART CATH AND CORONARY ANGIOGRAPHY: CATH118249

## 2020-10-12 SURGERY — LEFT HEART CATH AND CORONARY ANGIOGRAPHY
Anesthesia: LOCAL

## 2020-10-12 MED ORDER — HEPARIN SODIUM (PORCINE) 1000 UNIT/ML IJ SOLN
INTRAMUSCULAR | Status: DC | PRN
  Administered 2020-10-12: 6000 [IU] via INTRAVENOUS

## 2020-10-12 MED ORDER — MIDAZOLAM HCL 2 MG/2ML IJ SOLN
INTRAMUSCULAR | Status: AC
  Filled 2020-10-12: qty 2

## 2020-10-12 MED ORDER — SODIUM CHLORIDE 0.9% FLUSH: 3.0000 mL | INTRAVENOUS | Status: DC | PRN

## 2020-10-12 MED ORDER — SODIUM CHLORIDE 0.9% FLUSH: 3.0000 mL | Freq: Two times a day (BID) | INTRAVENOUS | Status: DC

## 2020-10-12 MED ORDER — LIDOCAINE HCL (PF) 1 % IJ SOLN
INTRAMUSCULAR | Status: DC | PRN
  Administered 2020-10-12: 2 mL

## 2020-10-12 MED ORDER — SODIUM CHLORIDE 0.9 % IV SOLN: 250.0000 mL | INTRAVENOUS | Status: DC | PRN

## 2020-10-12 MED ORDER — ASPIRIN 81 MG PO CHEW: 81.0000 mg | CHEWABLE_TABLET | ORAL | Status: DC

## 2020-10-12 MED ORDER — FENTANYL CITRATE (PF) 100 MCG/2ML IJ SOLN
INTRAMUSCULAR | Status: DC | PRN
  Administered 2020-10-12: 50 ug via INTRAVENOUS

## 2020-10-12 MED ORDER — VERAPAMIL HCL 2.5 MG/ML IV SOLN
INTRAVENOUS | Status: AC
  Filled 2020-10-12: qty 2

## 2020-10-12 MED ORDER — FENTANYL CITRATE (PF) 100 MCG/2ML IJ SOLN
INTRAMUSCULAR | Status: AC
  Filled 2020-10-12: qty 2

## 2020-10-12 MED ORDER — SODIUM CHLORIDE 0.9 % IV SOLN: INTRAVENOUS | Status: DC

## 2020-10-12 MED ORDER — HEPARIN SODIUM (PORCINE) 1000 UNIT/ML IJ SOLN
INTRAMUSCULAR | Status: AC
  Filled 2020-10-12: qty 1

## 2020-10-12 MED ORDER — HEPARIN (PORCINE) IN NACL 1000-0.9 UT/500ML-% IV SOLN
INTRAVENOUS | Status: AC
  Filled 2020-10-12: qty 500

## 2020-10-12 MED ORDER — SODIUM CHLORIDE 0.9 % WEIGHT BASED INFUSION: 1.0000 mL/kg/h | INTRAVENOUS | Status: DC

## 2020-10-12 MED ORDER — LABETALOL HCL 5 MG/ML IV SOLN
10.0000 mg | INTRAVENOUS | Status: DC | PRN
Start: 2020-10-12 — End: 2020-10-12

## 2020-10-12 MED ORDER — HEPARIN (PORCINE) IN NACL 1000-0.9 UT/500ML-% IV SOLN
INTRAVENOUS | Status: DC | PRN
  Administered 2020-10-12 (×2): 500 mL

## 2020-10-12 MED ORDER — ONDANSETRON HCL 4 MG/2ML IJ SOLN: 4.0000 mg | Freq: Four times a day (QID) | INTRAMUSCULAR | Status: DC | PRN

## 2020-10-12 MED ORDER — VERAPAMIL HCL 2.5 MG/ML IV SOLN
INTRAVENOUS | Status: DC | PRN
  Administered 2020-10-12: 10 mL via INTRA_ARTERIAL

## 2020-10-12 MED ORDER — HYDRALAZINE HCL 20 MG/ML IJ SOLN: 10.0000 mg | INTRAMUSCULAR | Status: DC | PRN

## 2020-10-12 MED ORDER — LIDOCAINE HCL (PF) 1 % IJ SOLN
INTRAMUSCULAR | Status: AC
  Filled 2020-10-12: qty 30

## 2020-10-12 MED ORDER — MIDAZOLAM HCL 2 MG/2ML IJ SOLN
INTRAMUSCULAR | Status: DC | PRN
  Administered 2020-10-12: 1 mg via INTRAVENOUS

## 2020-10-12 MED ORDER — SODIUM CHLORIDE 0.9 % WEIGHT BASED INFUSION
3.0000 mL/kg/h | INTRAVENOUS | Status: AC
  Administered 2020-10-12: 3 mL/kg/h via INTRAVENOUS

## 2020-10-12 MED ORDER — ACETAMINOPHEN 325 MG PO TABS: 650.0000 mg | ORAL_TABLET | ORAL | Status: DC | PRN

## 2020-10-12 MED ORDER — IOHEXOL 350 MG/ML SOLN
INTRAVENOUS | Status: DC | PRN
  Administered 2020-10-12: 45 mL

## 2020-10-12 SURGICAL SUPPLY — 13 items
CATH INFINITI 5 FR JL3.5 (CATHETERS) ×1 IMPLANT
CATH INFINITI 5FR AL1 (CATHETERS) ×1 IMPLANT
CATH INFINITI JR4 5F (CATHETERS) ×1 IMPLANT
CATH OPTITORQUE TIG 4.0 5F (CATHETERS) ×1 IMPLANT
DEVICE RAD COMP TR BAND LRG (VASCULAR PRODUCTS) ×1 IMPLANT
GLIDESHEATH SLEND A-KIT 6F 22G (SHEATH) ×1 IMPLANT
KIT HEART LEFT (KITS) ×2 IMPLANT
PACK CARDIAC CATHETERIZATION (CUSTOM PROCEDURE TRAY) ×2 IMPLANT
SHEATH PROBE COVER 6X72 (BAG) ×1 IMPLANT
TRANSDUCER W/STOPCOCK (MISCELLANEOUS) ×2 IMPLANT
TUBING CIL FLEX 10 FLL-RA (TUBING) ×2 IMPLANT
WIRE EMERALD 3MM-J .035X260CM (WIRE) ×1 IMPLANT
WIRE HI TORQ VERSACORE-J 145CM (WIRE) ×1 IMPLANT

## 2020-10-12 NOTE — Interval H&P Note (Signed)
History and Physical Interval Note:  10/12/2020 5:06 PM  James Branch  has presented today for surgery, with the diagnosis of abnormal stress test, CAD.  The various methods of treatment have been discussed with the patient and family. After consideration of risks, benefits and other options for treatment, the patient has consented to  Procedure(s): LEFT HEART CATH AND CORONARY ANGIOGRAPHY (N/A) as a surgical intervention.  The patient's history has been reviewed, patient examined, no change in status, stable for surgery.  I have reviewed the patient's chart and labs.  Questions were answered to the patient's satisfaction.    2012 Appropriate Use Criteria for Diagnostic Catheterization CAD Assessment (Coronary Angiography With or Without Left Heart Catheterization and/or Left Ventriculography) Indication:  Suspected CAD (No Prior PCI, No Prior CABG, and No Prior Angiogram Showing > = 50% Angiographic Stenosis) Prior Noninvasive Testing: Stress Test With Imaging (SPECT MPI, Stress Echocardiography, Stress PET, Stress CMR) Intermediate-risk findings (e.g., 5% to 10% ischemic myocardium on stress SPECT MPI or stress PET, stress-induced wall motion abnormality in a single segment on stress echo or stress CMR) Pretest Symptom Status: Symptomatic A (7) Indication: 16; Score 7 6  Madex Seals J Naylene Foell

## 2020-10-12 NOTE — Research (Signed)
IDENTIFY Informed Consent                  Subject Name: James Branch   Subject met inclusion and exclusion criteria.  The informed consent form, study requirements and expectations were reviewed with the subject and questions and concerns were addressed prior to the signing of the consent form.  The subject verbalized understanding of the trial requirements.  The subject agreed to participate in the IDENTIFY trial and signed the informed consent at 13:11PM on 10/12/20.  The informed consent was obtained prior to performance of any protocol-specific procedures for the subject.  A copy of the signed informed consent was given to the subject and a copy was placed in the subject's medical record.    Ledon Snare , Research Assistant

## 2020-10-12 NOTE — Progress Notes (Signed)
Pt ambulated without difficulty or bleeding.   Discharged home with his daughter who will drive and his wife who will stay with pt x 24 hrs.

## 2020-10-12 NOTE — Discharge Instructions (Signed)
Radial Site Care  This sheet gives you information about how to care for yourself after your procedure. Your health care provider may also give you more specific instructions. If you have problems or questions, contact your health care provider. What can I expect after the procedure? After the procedure, it is common to have: Bruising and tenderness at the catheter insertion area. Follow these instructions at home: Medicines Take over-the-counter and prescription medicines only as told by your health care provider. Insertion site care Follow instructions from your health care provider about how to take care of your insertion site. Make sure you: Wash your hands with soap and water before you remove your bandage (dressing). If soap and water are not available, use hand sanitizer. May remove dressing in 24 hours. Check your insertion site every day for signs of infection. Check for: Redness, swelling, or pain. Fluid or blood. Pus or a bad smell. Warmth. Do no take baths, swim, or use a hot tub for 5 days. You may shower 24-48 hours after the procedure. Remove the dressing and gently wash the site with plain soap and water. Pat the area dry with a clean towel. Do not rub the site. That could cause bleeding. Do not apply powder or lotion to the site. Activity  For 24 hours after the procedure, or as directed by your health care provider: Do not flex or bend the affected arm. Do not push or pull heavy objects with the affected arm. Do not drive yourself home from the hospital or clinic. You may drive 24 hours after the procedure. Do not operate machinery or power tools. KEEP ARM ELEVATED THE REMAINDER OF THE DAY. Do not push, pull or lift anything that is heavier than 10 lb for 5 days. Ask your health care provider when it is okay to: Return to work or school. Resume usual physical activities or sports. Resume sexual activity. General instructions If the catheter site starts to  bleed, raise your arm and put firm pressure on the site. If the bleeding does not stop, get help right away. This is a medical emergency. DRINK PLENTY OF FLUIDS FOR THE NEXT 2-3 DAYS. No alcohol consumption for 24 hours after receiving sedation. If you went home on the same day as your procedure, a responsible adult should be with you for the first 24 hours after you arrive home. Keep all follow-up visits as told by your health care provider. This is important. Contact a health care provider if: You have a fever. You have redness, swelling, or yellow drainage around your insertion site. Get help right away if: You have unusual pain at the radial site. The catheter insertion area swells very fast. The insertion area is bleeding, and the bleeding does not stop when you hold steady pressure on the area. Your arm or hand becomes pale, cool, tingly, or numb. These symptoms may represent a serious problem that is an emergency. Do not wait to see if the symptoms will go away. Get medical help right away. Call your local emergency services (911 in the [place] Do not drive yourself to the hospital. Summary After the procedure, it is common to have bruising and tenderness at the site. Follow instructions from your health care provider about how to take care of your radial site wound. Check the wound every day for signs of infection.  This information is not intended to replace advice given to you by your health care provider. Make sure you discuss any questions you have with   your health care provider. Document Revised: 05/09/2017 Document Reviewed: 05/09/2017 Elsevier Patient Education  2020 Elsevier Inc[employer]  

## 2020-10-12 NOTE — H&P (Signed)
James Branch is an 65 y.o. male.   Chief Complaint: CAD with angina equivalent HPI:    65 y.o. Caucasian male with controlled hypertension, hyperlipidemia, tobacco dependence, CAD with exertional dyspnea, abnormal stress test, morbid obesity, venous insufficiency.  Patient is going to undergo umbilical hernia repair with mesh surgery in the near future, under general anesthesia.  Patient is known to have coronary artery disease with calcification seen on CT scan and left main as well as all 3 major vessels.  Nuclear stress test and May 2021 showed medium sized, medium intensity, reversible perfusion defect in mid to apical inferior myocardium-intermediate risk study.  Patient has stable baseline exertional dyspnea symptoms.  Before undergoing surgery under general anesthesia, I recommend coronary angiogram for restratification.  If severe left main stenosis noted, his risk would be elevated to undergo general anesthesia.  For any non-left main stenosis, the risk would likely be acceptable.    Past Medical History:  Diagnosis Date   Arthritis    knees   Asthma    CAD (coronary artery disease)    COPD (chronic obstructive pulmonary disease) (HCC)    Diastolic dysfunction    GERD (gastroesophageal reflux disease)    Hyperlipidemia    Hypertension    Obesity    OSA on CPAP    ahi-84, 18 CWP   Sleep apnea    wears cpap    Smoker    Umbilical hernia     Past Surgical History:  Procedure Laterality Date   CARDIAC CATHETERIZATION     COLONOSCOPY  2007   TONSILLECTOMY  1967     Family History  Problem Relation Age of Onset   Hypertension Mother    Hypertension Father    Throat cancer Father    Colon cancer Neg Hx    Colon polyps Neg Hx    Esophageal cancer Neg Hx    Rectal cancer Neg Hx    Stomach cancer Neg Hx     Social History:  reports that he quit smoking about 19 months ago. His smoking use included cigarettes. He has a 10.00 pack-year smoking history. He has never  used smokeless tobacco. He reports that he does not drink alcohol and does not use drugs.  Allergies:  Allergies  Allergen Reactions   Penicillins Swelling    Review of Systems  Cardiovascular:  Positive for dyspnea on exertion. Negative for chest pain, leg swelling, palpitations and syncope.    Blood pressure (!) 121/97, pulse 61, temperature 98.2 F (36.8 C), temperature source Oral, height '5\' 10"'  (1.778 m), weight (!) 149.7 kg, SpO2 98 %. Body mass index is 47.35 kg/m.  Physical Exam Vitals and nursing note reviewed.  Constitutional:      General: He is not in acute distress.    Appearance: He is obese.  Neck:     Vascular: No JVD.  Cardiovascular:     Rate and Rhythm: Normal rate and regular rhythm.     Pulses: Normal pulses.     Heart sounds: Normal heart sounds. No murmur heard. Pulmonary:     Effort: Pulmonary effort is normal.     Breath sounds: Normal breath sounds. No wheezing or rales.  Musculoskeletal:     Right lower leg: No edema.     Left lower leg: No edema.    Labs:   Lab Results  Component Value Date   WBC 6.1 09/23/2020   HGB 14.9 09/23/2020   HCT 43.4 09/23/2020   MCV 93 09/23/2020  PLT 234 09/23/2020   No results for input(s): NA, K, CL, CO2, BUN, CREATININE, CALCIUM, PROT, BILITOT, ALKPHOS, ALT, AST, GLUCOSE in the last 168 hours.  Invalid input(s): LABALBU  Lipid Panel     Component Value Date/Time   CHOL 129 02/02/2020 0835   TRIG 135 02/02/2020 0835   HDL 45 02/02/2020 0835   CHOLHDL 2.9 02/02/2020 0835   VLDL 15 04/23/2015 0925   LDLCALC 62 02/02/2020 0835    BNP (last 3 results) No results for input(s): BNP in the last 8760 hours.  HEMOGLOBIN A1C No results found for: HGBA1C, MPG  Cardiac Panel (last 3 results) No results for input(s): CKTOTAL, CKMB, RELINDX in the last 8760 hours.  Invalid input(s): TROPONINHS  No results found for: CKTOTAL, CKMB, CKMBINDEX   TSH No results for input(s): TSH in the last 8760  hours.   Medications Prior to Admission  Medication Sig Dispense Refill   albuterol (VENTOLIN HFA) 108 (90 Base) MCG/ACT inhaler INHALE 2 PUFFS INTO THE LUNGS 4 (FOUR) TIMES DAILY. (Patient taking differently: Inhale 2 puffs into the lungs every 6 (six) hours as needed for wheezing or shortness of breath. INHALE 2 PUFFS INTO THE LUNGS 4 (FOUR) TIMES DAILY.) 8.5 each 11   aspirin EC 81 MG tablet Take 81 mg by mouth daily.     atorvastatin (LIPITOR) 20 MG tablet Take 1 tablet (20 mg total) by mouth daily. 90 tablet 3   betamethasone dipropionate 0.05 % cream APPLY TO AFFECTED AREA TWICE A DAY (Patient taking differently: Apply 1 application topically 2 (two) times daily as needed (irritation).) 45 g 3   cholecalciferol (VITAMIN D3) 25 MCG (1000 UT) tablet Take 1,000 Units by mouth daily.     diltiazem 2 % GEL Apply 1 application topically 3 (three) times daily. X 8 weeks 30 g 1   furosemide (LASIX) 40 MG tablet TAKE 1 TABLET BY MOUTH EVERY DAY (Patient taking differently: Take 40 mg by mouth daily.) 90 tablet 3   lisinopril (ZESTRIL) 20 MG tablet Take 1 tablet (20 mg total) by mouth daily. 90 tablet 3   metoprolol tartrate (LOPRESSOR) 25 MG tablet Take 25 mg by mouth 2 (two) times daily.     nitroGLYCERIN (NITROSTAT) 0.4 MG SL tablet Place 0.4 mg under the tongue every 5 (five) minutes as needed for chest pain.     Omega-3 Fatty Acids (FISH OIL) 1000 MG CAPS Take 1,000 mg by mouth 3 (three) times a week.     guaiFENesin (MUCINEX) 600 MG 12 hr tablet Take 1 tablet (600 mg total) by mouth 2 (two) times daily as needed for cough or to loosen phlegm. 30 tablet 0   sodium chloride (OCEAN) 0.65 % SOLN nasal spray Place 1 spray into both nostrils as needed for congestion. 88 mL 0      Current Facility-Administered Medications:    0.9 %  sodium chloride infusion, 250 mL, Intravenous, PRN, Sumeet Geter J, MD   0.9% sodium chloride infusion, 3 mL/kg/hr, Intravenous, Continuous **FOLLOWED BY** 0.9%  sodium chloride infusion, 1 mL/kg/hr, Intravenous, Continuous, Tenita Cue J, MD   [START ON 10/13/2020] aspirin chewable tablet 81 mg, 81 mg, Oral, Pre-Cath, Lenorris Karger J, MD   sodium chloride flush (NS) 0.9 % injection 3 mL, 3 mL, Intravenous, Q12H, Janathan Bribiesca J, MD   sodium chloride flush (NS) 0.9 % injection 3 mL, 3 mL, Intravenous, PRN, [name], MD   Today's Vitals   10/12/20 1259  BP: (!) 121/97  Pulse: 61  Temp: 98.2 F (36.8 C)  TempSrc: Oral  SpO2: 98%  Weight: (!) 149.7 kg  Height: '5\' 10"'  (1.778 m)   Body mass index is 47.35 kg/m.   CARDIAC STUDIES:  EKG 07/05/2020: Sinus rhythm 60 bpm Low voltage, otherwise normal EKG   Lexiscan Tetrofosmin stress test 09/08/2019: Lexiscan/modified Bruce nuclear stress test performed using 1-day protocol. Stress EKG is non-diagnostic, as this is pharmacological stress test. In addition, stress EKG at 79% MPHR showed sinus tachycardia, <1 mm downsloping ST segment depression in inferior leads. SPECT images show medium sized, medium intensity, reversible perfusion defect in mid to apical inferior myocardium. STress LVEF 70%. Intermediate risk study.     EKG 09/05/2019: Sinus rhythm 68 bpm Normal EKG     Ultrasound abdominal aorta 02/17/2019: Abdominal Aorta: No evidence of an abdominal aortic aneurysm was visualized. Very limited exam and unable to do a thorough exam of the aorta and iliac arteries. Stenosis: Very limited exam. From what was seen there is no evidence of stenosis. Can not rule out stenosis on segments not visualized.   CT chest 02/11/2019: 1. Lung-RADS 2S, benign appearance or behavior. Continue annual screening with low-dose chest CT without contrast in 12 months. 2. The "S" modifier above refers to potentially clinically significant non lung cancer related findings. Specifically, there is aortic atherosclerosis, in addition to left main and 3 vessel coronary artery disease.  Please note that although the presence of coronary artery calcium documents the presence of coronary artery disease, the severity of this disease and any potential stenosis cannot be assessed on this non-gated CT examination. Assessment for potential risk factor modification, dietary therapy or pharmacologic therapy may be warranted, if clinically indicated. 3. Mild diffuse bronchial wall thickening with very mild centrilobular and paraseptal emphysema; imaging findings suggestive of underlying COPD. 4. Hepatic steatosis.  Recent labs: 09/23/2020: Glucose 88, BUN/Cr 12/0.92. EGFR 91. Na/K 141/4.4. Rest of the CMP normal H/H 14/43. MCV 93. Platelets 234  01/2020: Chol 129, TG 135, HDL 45, LDL 62  Assessment/Plan   65 y.o. Caucasian male with controlled hypertension, hyperlipidemia, tobacco dependence, CAD with exertional dyspnea, abnormal stress test, morbid obesity, venous insufficiency.  CAD: Exertional dyspnea with multivessel coronary calcification. Mildly abnormal stress test. Plan for coronary angiography for risk stratification prior to umbilical hernia surgery under general anesthesia   [name], MD Pager: [phone] Office: [phone]

## 2020-10-13 ENCOUNTER — Encounter (HOSPITAL_COMMUNITY): Payer: Self-pay | Admitting: Cardiology

## 2020-10-20 ENCOUNTER — Other Ambulatory Visit: Payer: Self-pay

## 2020-10-20 ENCOUNTER — Ambulatory Visit: Payer: Medicare Other | Admitting: Cardiology

## 2020-10-20 ENCOUNTER — Encounter: Payer: Self-pay | Admitting: Cardiology

## 2020-10-20 VITALS — BP 120/64 | HR 67 | Temp 97.8°F | Ht 70.0 in | Wt 342.0 lb

## 2020-10-20 DIAGNOSIS — R0609 Other forms of dyspnea: Secondary | ICD-10-CM

## 2020-10-20 DIAGNOSIS — E782 Mixed hyperlipidemia: Secondary | ICD-10-CM | POA: Diagnosis not present

## 2020-10-20 DIAGNOSIS — R0600 Dyspnea, unspecified: Secondary | ICD-10-CM

## 2020-10-20 NOTE — Progress Notes (Signed)
  Patient referred by [name], Warren T, MD for coronary artery calcification.  Subjective:   James Branch, male    DOB: 07/06/1955, 65 y.o.   MRN: [id number]   Chief Complaint  Patient presents with   Coronary Artery Disease   Follow-up    HPI  65 y.o. Caucasian male with controlled hypertension, hyperlipidemia, tobacco dependence, CAD with exertional dyspnea, abnormal stress test, morbid obesity, venous insufficiency  Patient underwent coronary angiogram recently, details below.  He denies any chest pain.  He asks if he could walk more.  On a separate note, he also wants to know if he can use sildenafil.  Current Outpatient Medications on File Prior to Visit  Medication Sig Dispense Refill   albuterol (VENTOLIN HFA) 108 (90 Base) MCG/ACT inhaler INHALE 2 PUFFS INTO THE LUNGS 4 (FOUR) TIMES DAILY. (Patient taking differently: Inhale 2 puffs into the lungs every 6 (six) hours as needed for wheezing or shortness of breath. INHALE 2 PUFFS INTO THE LUNGS 4 (FOUR) TIMES DAILY.) 8.5 each 11   atorvastatin (LIPITOR) 20 MG tablet Take 1 tablet (20 mg total) by mouth daily. 90 tablet 3   betamethasone dipropionate 0.05 % cream APPLY TO AFFECTED AREA TWICE A DAY (Patient taking differently: Apply 1 application topically 2 (two) times daily as needed (irritation).) 45 g 3   cholecalciferol (VITAMIN D3) 25 MCG (1000 UT) tablet Take 1,000 Units by mouth daily.     diltiazem 2 % GEL Apply 1 application topically 3 (three) times daily. X 8 weeks 30 g 1   furosemide (LASIX) 40 MG tablet TAKE 1 TABLET BY MOUTH EVERY DAY (Patient taking differently: Take 40 mg by mouth daily.) 90 tablet 3   guaiFENesin (MUCINEX) 600 MG 12 hr tablet Take 1 tablet (600 mg total) by mouth 2 (two) times daily as needed for cough or to loosen phlegm. 30 tablet 0   lisinopril (ZESTRIL) 20 MG tablet Take 1 tablet (20 mg total) by mouth daily. 90 tablet 3   metoprolol tartrate (LOPRESSOR) 25 MG tablet Take 25 mg by mouth 2  (two) times daily.     nitroGLYCERIN (NITROSTAT) 0.4 MG SL tablet Place 0.4 mg under the tongue every 5 (five) minutes as needed for chest pain.     Omega-3 Fatty Acids (FISH OIL) 1000 MG CAPS Take 1,000 mg by mouth 3 (three) times a week.     sodium chloride (OCEAN) 0.65 % SOLN nasal spray Place 1 spray into both nostrils as needed for congestion. 88 mL 0   No current facility-administered medications on file prior to visit.    Cardiovascular studies:  Coronary angiography 10/12/2020:  Left dominant circulation Mild calcific disease 20% in prox-mid LAD LVEDP 20 mmHg  Dyspnea most likely due to obesity and elevated LVEDP Okay to stop Aspirin Okay to proceed with umbilical hernia surgery    EKG 07/05/2020: Sinus rhythm 60 bpm Low voltage, otherwise normal EKG  Lexiscan Tetrofosmin stress test 09/08/2019: Lexiscan/modified Bruce nuclear stress test performed using 1-day protocol. Stress EKG is non-diagnostic, as this is pharmacological stress test. In addition, stress EKG at 79% MPHR showed sinus tachycardia, <1 mm downsloping ST segment depression in inferior leads.  SPECT images show medium sized, medium intensity, reversible perfusion defect in mid to apical inferior myocardium. STress LVEF 70%. Intermediate risk study.    EKG 09/05/2019: Sinus rhythm 68 bpm Normal EKG   Ultrasound abdominal aorta 02/17/2019: Abdominal Aorta: No evidence of an abdominal aortic aneurysm was visualized. Very limited   exam and unable to do a thorough exam of the aorta and iliac arteries.  Stenosis: Very limited exam. From what was seen there is no evidence of stenosis.  Can not rule out stenosis on segments not visualized.  CT chest 02/11/2019: 1. Lung-RADS 2S, benign appearance or behavior. Continue annual screening with low-dose chest CT without contrast in 12 months. 2. The "S" modifier above refers to potentially clinically significant non lung cancer related findings.  Specifically, there is  aortic atherosclerosis, in addition to left main and 3 vessel coronary artery disease. Please note that although the presence of coronary artery calcium  documents the presence of coronary artery disease, the severity of this disease and any potential stenosis cannot be assessed on this non-gated CT examination. Assessment for potential risk factor modification,  dietary therapy or pharmacologic therapy may be warranted, if clinically indicated. 3. Mild diffuse bronchial wall thickening with very mild centrilobular and paraseptal emphysema;  imaging findings suggestive of underlying COPD. 4. Hepatic steatosis.   Aortic Atherosclerosis and Emphysema    Recent labs: 02/02/2020: Glucose 99, BUN/Cr 13/0.79. EGFR 94. Na/K 138/4.6. Rest of the CMP normal H/H 15/47. MCV 93. Platelets 267 HbA1C N/A% Chol 129, TG 135, HDL 45, LDL 62 TSH N/A  01/31/2019: Glucose 106, BUN/Cr 25/1. EGFR 79. Na/K 141/4.4. Rest of the CMP normal H/H 15/44. MCV 93. Platelets 243 Chol 127, TG 93, HDL 46, LDL 63.   Review of Systems  Cardiovascular:  Positive for dyspnea on exertion. Negative for chest pain, leg swelling, palpitations and syncope.  Respiratory:  Positive for shortness of breath.         Vitals:   10/20/20 1428  BP: 120/64  Pulse: 67  Temp: 97.8 F (36.6 C)  SpO2: 95%     Body mass index is 49.07 kg/m. Filed Weights   10/20/20 1428  Weight: (!) 342 lb (155.1 kg)     Objective:   Physical Exam Vitals and nursing note reviewed.  Constitutional:      General: He is not in acute distress. Neck:     Vascular: No JVD.  Cardiovascular:     Rate and Rhythm: Normal rate and regular rhythm.     Heart sounds: Normal heart sounds. No murmur heard. Pulmonary:     Effort: Pulmonary effort is normal.     Breath sounds: Normal breath sounds. No wheezing or rales.  Musculoskeletal:     Right lower leg: No edema.     Left lower leg: No edema.          Assessment & Recommendations:    65 y.o. Caucasian male with controlled hypertension, hyperlipidemia, tobacco dependence, CAD with exertional dyspnea, abnormal stress test, morbid obesity, venous insufficiency  CAD: Mostly medial calcification on coronary angiogram with no significant obstructive coronary artery disease seen. Okay to stop Aspirin.  Continue statin Encouraged regular exercise. Consider pharmacological/surgical weight loss. Defer to PCP.  Okay to proceed with umbilical hernia surgery, low cardiac risk  Exertional dyspnea: Most likely related to obesity.   Hypertension: Controlled  F/u in 6 months.   [name], MD [facility] Pager: [phone] Office: [phone]

## 2020-11-12 ENCOUNTER — Other Ambulatory Visit: Payer: Self-pay | Admitting: Surgery

## 2020-11-21 ENCOUNTER — Other Ambulatory Visit: Payer: Self-pay | Admitting: Cardiology

## 2020-11-21 ENCOUNTER — Other Ambulatory Visit: Payer: Self-pay | Admitting: Family Medicine

## 2020-11-21 DIAGNOSIS — I2510 Atherosclerotic heart disease of native coronary artery without angina pectoris: Secondary | ICD-10-CM

## 2020-11-26 ENCOUNTER — Telehealth: Payer: Self-pay | Admitting: Pharmacist

## 2020-11-26 NOTE — Progress Notes (Addendum)
    Chronic Care Management Pharmacy Assistant   Name: James Branch  MRN: [id number] DOB: April 20, 1955  Reason for Encounter: Medication Review/Medication Coordination Call.   Medications: Outpatient Encounter Medications as of 11/26/2020  Medication Sig   albuterol (VENTOLIN HFA) 108 (90 Base) MCG/ACT inhaler INHALE 2 PUFFS INTO THE LUNGS 4 (FOUR) TIMES DAILY. (Patient taking differently: Inhale 2 puffs into the lungs every 6 (six) hours as needed for wheezing or shortness of breath. INHALE 2 PUFFS INTO THE LUNGS 4 (FOUR) TIMES DAILY.)   atorvastatin (LIPITOR) 20 MG tablet Take 1 tablet (20 mg total) by mouth daily.   betamethasone dipropionate 0.05 % cream Apply 1 application topically 2 (two) times daily as needed (irritation).   cholecalciferol (VITAMIN D3) 25 MCG (1000 UT) tablet Take 1,000 Units by mouth daily.   diltiazem 2 % GEL Apply 1 application topically 3 (three) times daily. X 8 weeks   furosemide (LASIX) 40 MG tablet TAKE 1 TABLET BY MOUTH EVERY DAY (Patient taking differently: Take 40 mg by mouth daily.)   guaiFENesin (MUCINEX) 600 MG 12 hr tablet Take 1 tablet (600 mg total) by mouth 2 (two) times daily as needed for cough or to loosen phlegm.   lisinopril (ZESTRIL) 20 MG tablet TAKE ONE TABLET BY MOUTH ONCE DAILY   metoprolol tartrate (LOPRESSOR) 25 MG tablet Take 25 mg by mouth 2 (two) times daily.   nitroGLYCERIN (NITROSTAT) 0.4 MG SL tablet Place 0.4 mg under the tongue every 5 (five) minutes as needed for chest pain.   Omega-3 Fatty Acids (FISH OIL) 1000 MG CAPS Take 1,000 mg by mouth 3 (three) times a week.   sodium chloride (OCEAN) 0.65 % SOLN nasal spray Place 1 spray into both nostrils as needed for congestion.   No facility-administered encounter medications on file as of 11/26/2020.    Reviewed chart for medication changes ahead of medication coordination call.  No Ov visits since last care coordination call.  Consults Visits: 10/20/20 Cardiology Patwardhan,  Reynold Bowen, MD. For follow-up. No medication changes.   Hospital Visit: 10/12/20 (Cardiology) [facility] (7 Hours) [name], MD. For Left Heart cath and Coronary Angiography. No medication changes.   No medication changes indicated.  BP Readings from Last 3 Encounters:  10/20/20 120/64  10/12/20 (!) 117/57  08/10/20 120/68    No results found for: HGBA1C   Patient obtains medications through Vials  90 Days   Last adherence delivery included: Albuterol Sulfate 90 mcg daily Atorvastatin 20 mg daily Furosemide 40 mg daily Metoprolol tartrate 25 mg twice daily  Lisinopril 20 mg daily Betamethasone Diproprionate Cr 0.05% (PRN)  Patient declined (meds) last month: Nitroglycerin 0.'4mg'$  SL (PRN)  Patient is due for next adherence delivery on: 12/07/20.  Called patient and reviewed medications and coordinated delivery.  This delivery to include: Albuterol Sulfate 90 mcg daily Atorvastatin 20 mg daily Furosemide 40 mg daily Metoprolol tartrate 25 mg twice daily  Lisinopril 20 mg daily  Patient declined the following medications: Betamethasone Diproprionate Cr 0.05% (PRN) Nitroglycerin 0.'4mg'$  SL (PRN)  Patient needs refills for Metoprolol, requested.  Confirmed delivery date of 12/07/20, advised patient that pharmacy will contact them the morning of delivery.  Follow-Up:Pharmacist Review  Charlann Lange, RMA Clinical Pharmacist Assistant [phone]  10 minutes spent in review, coordination, and documentation.  Reviewed by: Beverly Milch, PharmD Clinical Pharmacist [phone]

## 2020-11-30 ENCOUNTER — Other Ambulatory Visit: Payer: Self-pay | Admitting: Cardiology

## 2020-11-30 DIAGNOSIS — I2510 Atherosclerotic heart disease of native coronary artery without angina pectoris: Secondary | ICD-10-CM

## 2020-12-07 NOTE — Pre-Procedure Instructions (Signed)
Surgical Instructions    Your procedure is scheduled on Thursday 12/16/20.   Report to [facility] Main Entrance "A" at 08:15 A.M., then check in with the Admitting office.  Call this number if you have problems the morning of surgery:  [phone]   If you have any questions prior to your surgery date call [phone]: Open Monday-Friday 8am-4pm    Remember:  Do not eat after midnight the night before your surgery  You may drink clear liquids until 07:15 A.M. the morning of your surgery.   Clear liquids allowed are: Water, Non-Citrus Juices (without pulp), Carbonated Beverages, Clear Tea, Black Coffee Only- no cream or sugar, and Gatorade  Patient Instructions  The night before surgery:  No food after midnight. ONLY clear liquids after midnight  The day of surgery (if you do NOT have diabetes):  Drink ONE (1) Pre-Surgery Clear Ensure by 07:15 A.M. the morning of surgery. Drink in one sitting. Do not sip.  This drink was given to you during your hospital  pre-op appointment visit.  Nothing else to drink after completing the  Pre-Surgery Clear Ensure.         If you have questions, please contact your surgeon's office.     Take these medicines the morning of surgery with A SIP OF WATER   metoprolol tartrate (LOPRESSOR)  atorvastatin (LIPITOR)  albuterol (VENTOLIN HFA) 108 (90 Base)- If needed  nitroGLYCERIN (NITROSTAT)- If needed    As of today, STOP taking any Aspirin (unless otherwise instructed by your surgeon) Aleve, Naproxen, Ibuprofen, Motrin, Advil, Goody's, BC's, all herbal medications, fish oil, and all vitamins.                     Do NOT Smoke (Tobacco/Vaping) or drink Alcohol 24 hours prior to your procedure.  If you use a CPAP at night, you may bring all equipment for your overnight stay.   Contacts, glasses, piercing's, hearing aid's, dentures or partials may not be worn into surgery, please bring cases for these belongings.    For patients admitted to  the hospital, discharge time will be determined by your treatment team.   Patients discharged the day of surgery will not be allowed to drive home, and someone needs to stay with them for 24 hours.  ONLY 1 SUPPORT PERSON MAY BE PRESENT WHILE YOU ARE IN SURGERY. IF YOU ARE TO BE ADMITTED ONCE YOU ARE IN YOUR ROOM YOU WILL BE ALLOWED TWO (2) VISITORS.  Minor children may have two parents present. Special consideration for safety and communication needs will be reviewed on a case by case basis.   Special instructions:   [place]- Preparing For Surgery  Before surgery, you can play an important role. Because skin is not sterile, your skin needs to be as free of germs as possible. You can reduce the number of germs on your skin by washing with CHG (chlorahexidine gluconate) Soap before surgery.  CHG is an antiseptic cleaner which kills germs and bonds with the skin to continue killing germs even after washing.    Oral Hygiene is also important to reduce your risk of infection.  Remember - BRUSH YOUR TEETH THE MORNING OF SURGERY WITH YOUR REGULAR TOOTHPASTE  Please do not use if you have an allergy to CHG or antibacterial soaps. If your skin becomes reddened/irritated stop using the CHG.  Do not shave (including legs and underarms) for at least 48 hours prior to first CHG shower. It is OK to shave  your face.  Please follow these instructions carefully.   Shower the NIGHT BEFORE SURGERY and the MORNING OF SURGERY  If you chose to wash your hair, wash your hair first as usual with your normal shampoo.  After you shampoo, rinse your hair and body thoroughly to remove the shampoo.  Use CHG Soap as you would any other liquid soap. You can apply CHG directly to the skin and wash gently with a scrungie or a clean washcloth.   Apply the CHG Soap to your body ONLY FROM THE NECK DOWN.  Do not use on open wounds or open sores. Avoid contact with your eyes, ears, mouth and genitals (private parts). Wash  Face and genitals (private parts)  with your normal soap.   Wash thoroughly, paying special attention to the area where your surgery will be performed.  Thoroughly rinse your body with warm water from the neck down.  DO NOT shower/wash with your normal soap after using and rinsing off the CHG Soap.  Pat yourself dry with a CLEAN TOWEL.  Wear CLEAN PAJAMAS to bed the night before surgery  Place CLEAN SHEETS on your bed the night before your surgery  DO NOT SLEEP WITH PETS.   Day of Surgery: Shower with CHG soap. Do not wear jewelry, make up, nail polish, gel polish, artificial nails, or any other type of covering on natural nails including finger and toenails. If patients have artificial nails, gel coating, etc. that need to be removed by a nail salon please have this removed prior to surgery. Surgery may need to be canceled/delayed if the surgeon/ anesthesia feels like the patient is unable to be adequately monitored. Do not wear lotions, powders, perfumes/colognes, or deodorant. Do not shave 48 hours prior to surgery.  Men may shave face and neck. Do not bring valuables to the hospital. [facility] is not responsible for any belongings or valuables. Wear Clean/Comfortable clothing the morning of surgery Remember to brush your teeth WITH YOUR REGULAR TOOTHPASTE.   Please read over the following fact sheets that you were given.

## 2020-12-08 ENCOUNTER — Encounter (HOSPITAL_COMMUNITY): Payer: Self-pay

## 2020-12-08 ENCOUNTER — Encounter (HOSPITAL_COMMUNITY)
Admission: RE | Admit: 2020-12-08 | Discharge: 2020-12-08 | Disposition: A | Discharge status: Home / Self Care | Payer: Medicare Other | Source: Ambulatory Visit | Attending: Orthopaedic Surgery | Admitting: Orthopaedic Surgery

## 2020-12-08 ENCOUNTER — Other Ambulatory Visit: Payer: Self-pay

## 2020-12-08 DIAGNOSIS — K429 Umbilical hernia without obstruction or gangrene: Secondary | ICD-10-CM | POA: Insufficient documentation

## 2020-12-08 DIAGNOSIS — G4733 Obstructive sleep apnea (adult) (pediatric): Secondary | ICD-10-CM | POA: Insufficient documentation

## 2020-12-08 DIAGNOSIS — Z01812 Encounter for preprocedural laboratory examination: Secondary | ICD-10-CM | POA: Diagnosis not present

## 2020-12-08 DIAGNOSIS — I2510 Atherosclerotic heart disease of native coronary artery without angina pectoris: Secondary | ICD-10-CM | POA: Insufficient documentation

## 2020-12-08 LAB — CBC
HCT: 45.5 % (ref 39.0–52.0)
Hemoglobin: 15.2 g/dL (ref 13.0–17.0)
MCH: 31.9 pg (ref 26.0–34.0)
MCHC: 33.4 g/dL (ref 30.0–36.0)
MCV: 95.4 fL (ref 80.0–100.0)
Platelets: 227 10*3/uL (ref 150–400)
RBC: 4.77 MIL/uL (ref 4.22–5.81)
RDW: 13.6 % (ref 11.5–15.5)
WBC: 7.1 10*3/uL (ref 4.0–10.5)
nRBC: 0 % (ref 0.0–0.2)

## 2020-12-08 LAB — BASIC METABOLIC PANEL
Anion gap: 5 (ref 5–15)
BUN: 13 mg/dL (ref 8–23)
CO2: 25 mmol/L (ref 22–32)
Calcium: 8.9 mg/dL (ref 8.9–10.3)
Chloride: 107 mmol/L (ref 98–111)
Creatinine, Ser: 0.98 mg/dL (ref 0.61–1.24)
GFR, Estimated: >60 mL/min (ref >60)
Glucose, Bld: 109 mg/dL — ABNORMAL HIGH (ref 70–99)
Potassium: 4.3 mmol/L (ref 3.5–5.1)
Sodium: 137 mmol/L (ref 135–145)

## 2020-12-08 NOTE — Progress Notes (Signed)
PCP - Dr. Jenna Luo Cardiologist - Dr. Vernell Leep  PPM/ICD - n/a Device Orders - n/a Rep Notified - n/a  Chest x-ray - n/a EKG - 10/12/20 Stress Test - 09/08/19 ECHO - n/a Cardiac Cath - 10/13/20  Sleep Study - 2012 CPAP - Yes. Wears nightly. Does not know settings.   Fasting Blood Sugar - n/a Checks Blood Sugar ___  n/a __ times a day  Blood Thinner Instructions: n/a Aspirin Instructions: n/a  ERAS Protcol - Yes PRE-SURGERY Ensure or G2- Ensure  COVID TEST- No. Ambulatory Surgery   Anesthesia review: Yes. Clearance note from Dr. Virgina Jock 10/20/20 in [place].   Patient denies shortness of breath, fever, cough and chest pain at PAT appointment   All instructions explained to the patient, with a verbal understanding of the material. Patient agrees to go over the instructions while at home for a better understanding. Patient also instructed to self quarantine after being tested for COVID-19. The opportunity to ask questions was provided.

## 2020-12-09 NOTE — Progress Notes (Signed)
Anesthesia Chart Review:  Recent CAD evaluation by cardiologist Dr. Virgina Jock. Initially had abnormal stress, but subsequent cath showed no significant obstructive disease. Per note 10/20/20, " CAD: Mostly medial calcification on coronary angiogram with no significant obstructive coronary artery disease seen. Okay to stop Aspirin. Continue statin. Encouraged regular exercise. Consider pharmacological/surgical weight loss. Defer to PCP. Okay to proceed with umbilical hernia surgery, low cardiac risk."  OSA on CPAP.  Preop labs reviewed, unremarkable.  EKG 10/04/2020: Normal sinus rhythm. Rate 62. Low voltage QRS  Cath 10/12/20: Left dominant circulation Mild calcific disease 20% in prox-mid LAD Dyspnea most likely due to obesity and elevated LVEDP Okay to stop Aspirin Okay to proceed with umbilical hernia surgery   Wynonia Musty [facility] Short Stay Center/Anesthesiology Phone [phone] 12/09/2020 2:35 PM

## 2020-12-09 NOTE — Anesthesia Preprocedure Evaluation (Addendum)
Anesthesia Evaluation  Patient identified by MRN, date of birth, ID band Patient awake    Reviewed: Allergy & Precautions, NPO status , Patient's Chart, lab work & pertinent test results, reviewed documented beta blocker date and time   Airway Mallampati: III  TM Distance: >3 FB Neck ROM: Full    Dental  (+) Edentulous Upper, Edentulous Lower   Pulmonary asthma , sleep apnea and Continuous Positive Airway Pressure Ventilation , COPD,  COPD inhaler, former smoker,    Pulmonary exam normal        Cardiovascular hypertension, Pt. on medications and Pt. on home beta blockers + CAD  Normal cardiovascular exam Rhythm:Regular Rate:Normal  Cardiac Cath [id number]  LV end diastolic pressure is mildly elevated.   Left dominant circulation Mild calcific disease 20% in prox-mid LAD Dyspnea most likely due to obesity and elevated LVEDP Okay to stop Aspirin Okay to proceed with umbilical hernia surgery  EKG 10/12/20 NSR   Neuro/Psych negative neurological ROS  negative psych ROS   GI/Hepatic Neg liver ROS, GERD  Medicated,  Endo/Other  Morbid obesityHyperlipidemia  Renal/GU negative Renal ROS  negative genitourinary   Musculoskeletal  (+) Arthritis , Osteoarthritis,  Bilateral knees Incarcerated umbilical hernia   Abdominal (+) + obese,   Peds  Hematology negative hematology ROS (+)   Anesthesia Other Findings   Reproductive/Obstetrics                           Anesthesia Physical Anesthesia Plan  ASA: 3  Anesthesia Plan: General   Post-op Pain Management:    Induction: Intravenous  PONV Risk Score and Plan: Treatment may vary due to age or medical condition, Ondansetron and Dexamethasone  Airway Management Planned: LMA  Additional Equipment: None  Intra-op Plan:   Post-operative Plan: Extubation in OR  Informed Consent: I have reviewed the patients History and Physical, chart, labs and  discussed the procedure including the risks, benefits and alternatives for the proposed anesthesia with the patient or authorized representative who has indicated his/her understanding and acceptance.       Plan Discussed with: CRNA and Anesthesiologist  Anesthesia Plan Comments: (PAT note by Karoline Caldwell, PA-C:  Recent CAD evaluation by cardiologist Dr. Virgina Jock. Initially had abnormal stress, but subsequent cath showed no significant obstructive disease. Per note 10/20/20, "CAD: Mostly medial calcification on coronary angiogram with no significant obstructive coronary artery disease seen.Okay to stop Aspirin. Continue statin. Encouraged regular exercise. Consider pharmacological/surgical weight loss. Defer to PCP. Okay to proceed with umbilical hernia surgery, low cardiac risk."  OSA on CPAP.  Preop labs reviewed, unremarkable.  EKG 10/04/2020: Normal sinus rhythm. Rate 62. Low voltage QRS  Cath 10/12/20: Left dominant circulation Mild calcific disease 20% in prox-mid LAD Dyspnea most likely due to obesity and elevated LVEDP Okay to stop Aspirin Okay to proceed with umbilical hernia surgery  )      Anesthesia Quick Evaluation

## 2020-12-15 ENCOUNTER — Encounter (HOSPITAL_COMMUNITY): Payer: Self-pay | Admitting: Surgery

## 2020-12-15 NOTE — H&P (Signed)
James Branch  Location: [facility] Patient #: [id number] DOB: [birth year] Married / Language: English / Race: White Male   History of Present Illness  The patient is a 65 year old male who presents with an umbilical hernia.  Chief complaint: Umbilical hernia  This is a 65 year old gentleman with multiple medical problems including morbid obesity, COPD, and sleep apnea on CPAP, who presents with an umbilical hernia.  He reports that he has had it for several years and it will not reduce the causes no pain or obstructive symptoms.  He reports that it is getting larger.  Currently, he has no chest pain or shortness of breath.  He reports seeing his cardiologist for heart problems every 6 months.  He has no previous history of heart attack.   Past Surgical History  Anal Fissure Repair   Colon Polyp Removal - Colonoscopy   Oral Surgery   Tonsillectomy    Diagnostic Studies History  Colonoscopy   1-5 years ago  Allergies  Penicillins   Allergies Reconciled    Medication History  Albuterol Sulfate HFA  (108 (90 Base)MCG/ACT Aerosol Soln, Inhalation) Active. Furosemide  ('40MG'$  Tablet, Oral) Active. Atorvastatin Calcium  ('20MG'$  Tablet, Oral) Active. Lisinopril  ('20MG'$  Tablet, Oral) Active. Cholecalciferol  (25 MCG(1000 UT) Capsule, Oral) Active. Aspirin  ('81MG'$  Tablet, Oral) Active. Metoprolol Tartrate  ('25MG'$  Tablet, Oral) Active. Medications Reconciled   Social History  Alcohol use   Remotely quit alcohol use. Caffeine use   Carbonated beverages, Coffee, Tea. No drug use   Tobacco use   Former smoker.  Family History  Alcohol Abuse   Father. Cancer   Father, Mother. Diabetes Mellitus   Family Members In General. Heart Disease   Mother. Hypertension   Brother, Mother, Sister. Thyroid problems   Sister.  Other Problems  Asthma   Chronic Obstructive Lung Disease   Gastroesophageal Reflux Disease   High blood pressure   Hypercholesterolemia   Sleep Apnea       Review of Systems  General Not Present- Appetite Loss, Chills, Fatigue, Fever, Night Sweats, Weight Gain and Weight Loss. Skin Present- Dryness. Not Present- Change in Wart/Mole, Hives, Jaundice, New Lesions, Non-Healing Wounds, Rash and Ulcer. HEENT Present- Hearing Loss and Wears glasses/contact lenses. Not Present- Earache, Hoarseness, Nose Bleed, Oral Ulcers, Ringing in the Ears, Seasonal Allergies, Sinus Pain, Sore Throat, Visual Disturbances and Yellow Eyes. Respiratory Present- Snoring. Not Present- Bloody sputum, Chronic Cough, Difficulty Breathing and Wheezing. Breast Not Present- Breast Mass, Breast Pain, Nipple Discharge and Skin Changes. Cardiovascular Present- Shortness of Breath and Swelling of Extremities. Not Present- Chest Pain, Difficulty Breathing Lying Down, Leg Cramps, Palpitations and Rapid Heart Rate. Gastrointestinal Present- Excessive gas, Indigestion and Rectal Pain. Not Present- Abdominal Pain, Bloating, Bloody Stool, Change in Bowel Habits, Chronic diarrhea, Constipation, Difficulty Swallowing, Gets full quickly at meals, Hemorrhoids, Nausea and Vomiting. Male Genitourinary Not Present- Blood in Urine, Change in Urinary Stream, Frequency, Impotence, Nocturia, Painful Urination, Urgency and Urine Leakage. Musculoskeletal Not Present- Back Pain, Joint Pain, Joint Stiffness, Muscle Pain, Muscle Weakness and Swelling of Extremities. Neurological Not Present- Decreased Memory, Fainting, Headaches, Numbness, Seizures, Tingling, Tremor, Trouble walking and Weakness. Psychiatric Not Present- Anxiety, Bipolar, Change in Sleep Pattern, Depression, Fearful and Frequent crying. Endocrine Not Present- Cold Intolerance, Excessive Hunger, Hair Changes, Heat Intolerance, Hot flashes and New Diabetes. Hematology Not Present- Blood Thinners, Easy Bruising, Excessive bleeding, Gland problems, HIV and Persistent Infections.  Vitals   Weight: 344.6 lb   Height: 70 in  Body Surface  Area: 2.63 m   Body Mass Index: 49.44 kg/m   Temp.: 97.6 F    Pulse: 69 (Regular)    BP: 126/72(Sitting, Left Arm, Standard)       Physical Exam  The physical exam findings are as follows: Note:  He appears well controlled exam  Abdomen is morbidly obese. There is a moderate sized umbilical hernia which is chronically incarcerated but nontender. I suspect contains omentum  Lungs clear  CV RRR  Neuro grossly intact    Assessment & Plan  UMBILICAL HERNIA, INCARCERATED (K42.0)  COPD (CHRONIC OBSTRUCTIVE PULMONARY DISEASE) (J44.9) MORBID OBESITY (E66.01)  Impression: I reviewed his notes from electronic medical records. He does indeed have a moderate sized chronically incarcerated umbilical hernia. I discussed the diagnosis with him. We discussed reasons for hernia repair. As this is getting larger, the mesh is recommended. I would recommend this as an open procedure with mesh to try to limit anesthesia given his pulmonary disease. We discussed abdominal wall anatomy in general and hernias in general and the reasoning for proceeding with hernia repair. We discussed enlargement of the hernia without surgery as well as risks of incarceration of bowel. He would like to proceed with surgery. I discussed the risks of surgery which includes but is not limited to bleeding, infection, risk of recurrence given his morbid obesity, the need for further procedures, cardiopulmonary issues, postoperative recovery, etc.

## 2020-12-16 ENCOUNTER — Other Ambulatory Visit: Payer: Self-pay

## 2020-12-16 ENCOUNTER — Ambulatory Visit (HOSPITAL_COMMUNITY)
Admission: RE | Admit: 2020-12-16 | Discharge: 2020-12-16 | Disposition: A | Discharge status: Home / Self Care | Payer: Medicare Other | Attending: Surgery | Admitting: Surgery

## 2020-12-16 ENCOUNTER — Encounter (HOSPITAL_COMMUNITY)
Admission: RE | Disposition: A | Discharge status: Home / Self Care | Payer: Self-pay | Source: Home / Self Care | Attending: Surgery

## 2020-12-16 ENCOUNTER — Ambulatory Visit (HOSPITAL_COMMUNITY): Payer: Medicare Other | Admitting: Physician Assistant

## 2020-12-16 ENCOUNTER — Encounter (HOSPITAL_COMMUNITY): Payer: Self-pay | Admitting: Surgery

## 2020-12-16 ENCOUNTER — Ambulatory Visit (HOSPITAL_COMMUNITY): Payer: Medicare Other | Admitting: Anesthesiology

## 2020-12-16 DIAGNOSIS — Z79899 Other long term (current) drug therapy: Secondary | ICD-10-CM | POA: Diagnosis not present

## 2020-12-16 DIAGNOSIS — G4733 Obstructive sleep apnea (adult) (pediatric): Secondary | ICD-10-CM | POA: Insufficient documentation

## 2020-12-16 DIAGNOSIS — E782 Mixed hyperlipidemia: Secondary | ICD-10-CM | POA: Diagnosis not present

## 2020-12-16 DIAGNOSIS — Z7982 Long term (current) use of aspirin: Secondary | ICD-10-CM | POA: Diagnosis not present

## 2020-12-16 DIAGNOSIS — K420 Umbilical hernia with obstruction, without gangrene: Secondary | ICD-10-CM | POA: Insufficient documentation

## 2020-12-16 DIAGNOSIS — J449 Chronic obstructive pulmonary disease, unspecified: Secondary | ICD-10-CM | POA: Insufficient documentation

## 2020-12-16 DIAGNOSIS — Z880 Allergy status to penicillin: Secondary | ICD-10-CM | POA: Insufficient documentation

## 2020-12-16 DIAGNOSIS — Z9989 Dependence on other enabling machines and devices: Secondary | ICD-10-CM | POA: Diagnosis not present

## 2020-12-16 DIAGNOSIS — Z6841 Body Mass Index (BMI) 40.0 and over, adult: Secondary | ICD-10-CM | POA: Insufficient documentation

## 2020-12-16 DIAGNOSIS — K429 Umbilical hernia without obstruction or gangrene: Secondary | ICD-10-CM | POA: Diagnosis not present

## 2020-12-16 HISTORY — PX: INSERTION OF MESH: SHX5868

## 2020-12-16 HISTORY — PX: UMBILICAL HERNIA REPAIR: SHX196

## 2020-12-16 SURGERY — REPAIR, HERNIA, UMBILICAL, ADULT
Anesthesia: General | Site: Abdomen

## 2020-12-16 MED ORDER — ONDANSETRON HCL 4 MG/2ML IJ SOLN
INTRAMUSCULAR | Status: DC | PRN
  Administered 2020-12-16: 4 mg via INTRAVENOUS

## 2020-12-16 MED ORDER — CIPROFLOXACIN IN D5W 400 MG/200ML IV SOLN
INTRAVENOUS | Status: AC
  Filled 2020-12-16: qty 200

## 2020-12-16 MED ORDER — ACETAMINOPHEN 500 MG PO TABS
ORAL_TABLET | ORAL | Status: AC
  Administered 2020-12-16: 1000 mg via ORAL
  Filled 2020-12-16: qty 2

## 2020-12-16 MED ORDER — FENTANYL CITRATE (PF) 100 MCG/2ML IJ SOLN: 25.0000 ug | INTRAMUSCULAR | Status: DC | PRN

## 2020-12-16 MED ORDER — DEXAMETHASONE SODIUM PHOSPHATE 10 MG/ML IJ SOLN
INTRAMUSCULAR | Status: AC
  Filled 2020-12-16: qty 2

## 2020-12-16 MED ORDER — OXYCODONE HCL 5 MG PO TABS: 5.0000 mg | ORAL_TABLET | Freq: Four times a day (QID) | ORAL | 0 refills | Status: DC | PRN

## 2020-12-16 MED ORDER — 0.9 % SODIUM CHLORIDE (POUR BTL) OPTIME
TOPICAL | Status: DC | PRN
  Administered 2020-12-16: 1000 mL

## 2020-12-16 MED ORDER — PROPOFOL 10 MG/ML IV BOLUS
INTRAVENOUS | Status: AC
  Filled 2020-12-16: qty 20

## 2020-12-16 MED ORDER — DEXAMETHASONE SODIUM PHOSPHATE 10 MG/ML IJ SOLN
INTRAMUSCULAR | Status: DC | PRN
  Administered 2020-12-16: 10 mg via INTRAVENOUS

## 2020-12-16 MED ORDER — ONDANSETRON HCL 4 MG/2ML IJ SOLN
INTRAMUSCULAR | Status: AC
  Filled 2020-12-16: qty 4

## 2020-12-16 MED ORDER — BUPIVACAINE HCL (PF) 0.25 % IJ SOLN
INTRAMUSCULAR | Status: AC
  Filled 2020-12-16: qty 30

## 2020-12-16 MED ORDER — OXYCODONE HCL 5 MG/5ML PO SOLN: 5.0000 mg | Freq: Once | ORAL | Status: DC | PRN

## 2020-12-16 MED ORDER — LIDOCAINE 2% (20 MG/ML) 5 ML SYRINGE
INTRAMUSCULAR | Status: DC | PRN
  Administered 2020-12-16: 100 mg via INTRAVENOUS

## 2020-12-16 MED ORDER — LIDOCAINE 2% (20 MG/ML) 5 ML SYRINGE
INTRAMUSCULAR | Status: AC
  Filled 2020-12-16: qty 10

## 2020-12-16 MED ORDER — CHLORHEXIDINE GLUCONATE 0.12 % MT SOLN
15.0000 mL | Freq: Once | OROMUCOSAL | Status: AC
  Administered 2020-12-16: 15 mL via OROMUCOSAL
  Filled 2020-12-16: qty 15

## 2020-12-16 MED ORDER — ACETAMINOPHEN 500 MG PO TABS: 1000.0000 mg | ORAL_TABLET | Freq: Once | ORAL | Status: AC

## 2020-12-16 MED ORDER — STERILE WATER FOR IRRIGATION IR SOLN
Status: DC | PRN
  Administered 2020-12-16: 1000 mL

## 2020-12-16 MED ORDER — FENTANYL CITRATE (PF) 250 MCG/5ML IJ SOLN
INTRAMUSCULAR | Status: AC
  Filled 2020-12-16: qty 5

## 2020-12-16 MED ORDER — CIPROFLOXACIN IN D5W 400 MG/200ML IV SOLN
INTRAVENOUS | Status: DC | PRN
  Administered 2020-12-16: 400 mg via INTRAVENOUS

## 2020-12-16 MED ORDER — MIDAZOLAM HCL 5 MG/5ML IJ SOLN
INTRAMUSCULAR | Status: DC | PRN
  Administered 2020-12-16: 2 mg via INTRAVENOUS

## 2020-12-16 MED ORDER — ORAL CARE MOUTH RINSE: 15.0000 mL | Freq: Once | OROMUCOSAL | Status: AC

## 2020-12-16 MED ORDER — FENTANYL CITRATE (PF) 250 MCG/5ML IJ SOLN
INTRAMUSCULAR | Status: DC | PRN
  Administered 2020-12-16 (×2): 25 ug via INTRAVENOUS

## 2020-12-16 MED ORDER — LACTATED RINGERS IV SOLN: INTRAVENOUS | Status: DC

## 2020-12-16 MED ORDER — OXYCODONE HCL 5 MG PO TABS: 5.0000 mg | ORAL_TABLET | Freq: Once | ORAL | Status: DC | PRN

## 2020-12-16 MED ORDER — PROPOFOL 10 MG/ML IV BOLUS
INTRAVENOUS | Status: AC
  Filled 2020-12-16: qty 40

## 2020-12-16 MED ORDER — MIDAZOLAM HCL 2 MG/2ML IJ SOLN
INTRAMUSCULAR | Status: AC
  Filled 2020-12-16: qty 2

## 2020-12-16 MED ORDER — PROPOFOL 10 MG/ML IV BOLUS
INTRAVENOUS | Status: DC | PRN
  Administered 2020-12-16: 200 mg via INTRAVENOUS

## 2020-12-16 MED ORDER — ONDANSETRON HCL 4 MG/2ML IJ SOLN: 4.0000 mg | Freq: Once | INTRAMUSCULAR | Status: DC | PRN

## 2020-12-16 MED ORDER — BUPIVACAINE HCL (PF) 0.25 % IJ SOLN
INTRAMUSCULAR | Status: DC | PRN
  Administered 2020-12-16: 17 mL

## 2020-12-16 SURGICAL SUPPLY — 22 items
ADH SKN CLS APL DERMABOND .7 (GAUZE/BANDAGES/DRESSINGS) ×1
APL PRP STRL LF DISP 70% ISPRP (MISCELLANEOUS) ×1
BINDER ABDOMINAL 12 ML 46-62 (SOFTGOODS) IMPLANT
CHLORAPREP W/TINT 26 (MISCELLANEOUS) ×2 IMPLANT
DERMABOND ADVANCED (GAUZE/BANDAGES/DRESSINGS) ×1
DERMABOND ADVANCED .7 DNX12 (GAUZE/BANDAGES/DRESSINGS) ×1 IMPLANT
DRAPE LAPAROSCOPIC ABDOMINAL (DRAPES) ×2 IMPLANT
ELECT REM PT RETURN 15FT ADLT (MISCELLANEOUS) ×2 IMPLANT
GLOVE SURG ENC MOIS LTX SZ7.5 (GLOVE) ×2 IMPLANT
GOWN STRL REUS W/TWL XL LVL3 (GOWN DISPOSABLE) ×3 IMPLANT
KIT BASIN OR (CUSTOM PROCEDURE TRAY) ×2 IMPLANT
KIT TURNOVER KIT A (KITS) ×2 IMPLANT
MESH VENTRALEX ST 1-7/10 CRC S (Mesh General) ×1 IMPLANT
NEEDLE HYPO 22GX1.5 SAFETY (NEEDLE) ×2 IMPLANT
PACK GENERAL/GYN (CUSTOM PROCEDURE TRAY) ×1 IMPLANT
PENCIL SMOKE EVACUATOR (MISCELLANEOUS) ×1 IMPLANT
SUT MNCRL AB 4-0 PS2 18 (SUTURE) ×2 IMPLANT
SUT NOVA NAB DX-16 0-1 5-0 T12 (SUTURE) ×2 IMPLANT
SUT SILK 3 0 (SUTURE) ×2
SUT SILK 3-0 18XBRD TIE 12 (SUTURE) IMPLANT
SUT VIC AB 3-0 SH 27 (SUTURE) ×4
SUT VIC AB 3-0 SH 27X BRD (SUTURE) ×1 IMPLANT

## 2020-12-16 NOTE — Op Note (Signed)
UMBILICAL HERNIA REPAIR WITH MESH, INSERTION OF MESH  Procedure Note  James Branch 12/16/2020   Pre-op Diagnosis: UMBILICAL HERNIA, INCARCERATED     Post-op Diagnosis: same  Procedure(s): UMBILICAL HERNIA REPAIR WITH MESH INSERTION OF MESH  Surgeon(s): Coralie Keens, MD Drucie Ip, MD - Resident  Anesthesia: General  Staff:  Circulator: Rozell Searing, RN Relief Circulator: Rosanne Sack, RN Scrub Person: Margy Clarks, RN; Rolan Bucco  Indications:  The patient presented with a history of incarcerated umbilical hernia.  The patient was examined and we recommended umbilical hernia repair with mesh.  Pre-operative diagnosis:  Umbilical hernia  Post-operative diagnosis:  Same  Procedure:  Umbilical hernia repair with mesh  Procedure Details  The patient was seen again in the Holding Room. The risks, benefits, complications, treatment options, and expected outcomes were discussed with the patient. The possibilities of reaction to medication, pulmonary aspiration, perforation of viscus, bleeding, recurrent infection, the need for additional procedures, and development of a complication requiring transfusion or further operation were discussed with the patient and/or family. There was concurrence with the proposed plan, and informed consent was obtained. The site of surgery was properly noted/marked. The patient was taken to the Operating Room, identified as [name], and the procedure verified as umbilical hernia repair. A Time Out was held and the above information confirmed.  Patient went to sleep with LMA and the patient's abdomen was prepped with Chloraprep and draped in sterile fashion.  We made a transverse incision below the umbilicus.  Dissection was carried down to the hernia sac with cautery.  We dissected bluntly around the hernia sac down to the edge of the fascial defect.  We reduced the hernia sac back into the pre-peritoneal space.  The  fascial defect measured 2cm.  We cleared the fascia in all directions.  A 4.3cm round proline patch from Bard mesh was inserted into the pre-peritoneal space and was deployed.  The mesh was secured with 4 trans-fascial sutures of 0 Novofil.  The fascial defect was closed with one interrupted figure-of-eight 1 Novofil sutures.  The base of the umbilicus was tacked down with 3-0 Vicryl.  3-0 Vicryl was used to close the subcutaneous tissues and 4-0 Monocryl was used to close the skin.  Dermabond was applied.  The patient was woken up and brought to the recovery room in stable condition.  All sponge, instrument, and needle counts were correct prior to closure and at the conclusion of the case.   Estimated Blood Loss: Minimal               Specimens: none          Drucie Ip, MD  Date: 12/16/2020  Time: 10:06 AM

## 2020-12-16 NOTE — Transfer of Care (Signed)
Immediate Anesthesia Transfer of Care Note  Patient: James Branch  Procedure(s) Performed: UMBILICAL HERNIA REPAIR WITH MESH (Abdomen) INSERTION OF MESH (Abdomen)  Patient Location: PACU  Anesthesia Type:General  Level of Consciousness: drowsy  Airway & Oxygen Therapy: Patient Spontanous Breathing and Patient connected to face mask oxygen  Post-op Assessment: Report given to RN and Post -op Vital signs reviewed and stable  Post vital signs: Reviewed and stable  Last Vitals:  Vitals Value Taken Time  BP 100/77   Temp    Pulse 71   Resp 20   SpO2 100     Last Pain:  Vitals:   12/16/20 0829  TempSrc:   PainSc: 0-No pain         Complications: No notable events documented.

## 2020-12-16 NOTE — Interval H&P Note (Signed)
History and Physical Interval Note: no change in H and P  12/16/2020 8:42 AM  James Branch  has presented today for surgery, with the diagnosis of UMBILICAL HERNIA, INCARCERATED.  The various methods of treatment have been discussed with the patient and family. After consideration of risks, benefits and other options for treatment, the patient has consented to  Procedure(s) with comments: [place] (N/A) - LMA as a surgical intervention.  The patient's history has been reviewed, patient examined, no change in status, stable for surgery.  I have reviewed the patient's chart and labs.  Questions were answered to the patient's satisfaction.     Coralie Keens

## 2020-12-16 NOTE — Discharge Instructions (Signed)
[facility] _______[facility]  UMBILICAL OR INGUINAL HERNIA REPAIR: POST OP INSTRUCTIONS  Always review your discharge instruction sheet given to you by the facility where your surgery was performed. IF YOU HAVE DISABILITY OR FAMILY LEAVE FORMS, YOU MUST BRING THEM TO THE OFFICE FOR PROCESSING.   DO NOT GIVE THEM TO YOUR DOCTOR.  1. A  prescription for pain medication may be given to you upon discharge.  Take your pain medication as prescribed, if needed.  If narcotic pain medicine is not needed, then you may take acetaminophen (Tylenol) or ibuprofen (Advil) as needed. 2. Take your usually prescribed medications unless otherwise directed. If you need a refill on your pain medication, please contact your pharmacy.  They will contact our office to request authorization. Prescriptions will not be filled after 5 pm or on week-ends. 3. You should follow a light diet the first 24 hours after arrival home, such as soup and crackers, etc.  Be sure to include lots of fluids daily.  Resume your normal diet the day after surgery. 4.Most patients will experience some swelling and bruising around the umbilicus or in the groin and scrotum.  Ice packs and reclining will help.  Swelling and bruising can take several days to resolve.  6. It is common to experience some constipation if taking pain medication after surgery.  Increasing fluid intake and taking a stool softener (such as Colace) will usually help or prevent this problem from occurring.  A mild laxative (Milk of Magnesia or Miralax) should be taken according to package directions if there are no bowel movements after 48 hours. 7. Unless discharge instructions indicate otherwise, you may remove your bandages 24-48 hours after surgery, and you may shower at that time.  You may have steri-strips (small skin tapes) in place directly over the incision.  These strips should be left on the skin for 7-10 days.  If your surgeon used skin glue on the  incision, you may shower in 24 hours.  The glue will flake off over the next 2-3 weeks.  Any sutures or staples will be removed at the office during your follow-up visit. 8. ACTIVITIES:  You may resume regular (light) daily activities beginning the next day--such as daily self-care, walking, climbing stairs--gradually increasing activities as tolerated.  You may have sexual intercourse when it is comfortable.  Refrain from any heavy lifting or straining until approved by your doctor.  a.You may drive when you are no longer taking prescription pain medication, you can comfortably wear a seatbelt, and you can safely maneuver your car and apply brakes. b.RETURN TO WORK:   _____________________________________________  9.You should see your doctor in the office for a follow-up appointment approximately 2-3 weeks after your surgery.  Make sure that you call for this appointment within a day or two after you arrive home to insure a convenient appointment time. 10.OTHER INSTRUCTIONS: ___OK TO SHOWER STARTING TOMORROW ICE PACK, TYLENOL, AND IBUPROFEN ALSO FOR PAIN NO LIFTING MORE THAN 15 TO 20 POUNDS FOR 4 WEEKS______________________    _____________________________________  WHEN TO CALL YOUR DOCTOR: Fever over 101.0 Inability to urinate Nausea and/or vomiting Extreme swelling or bruising Continued bleeding from incision. Increased pain, redness, or drainage from the incision  The clinic staff is available to answer your questions during regular business hours.  Please don't hesitate to call and ask to speak to one of the nurses for clinical concerns.  If you have a medical emergency, go to the nearest emergency room or call  911.  A surgeon from [facility] is always on call at the hospital   [address] [place], [place], [place]  [address] ?  [address], [place], [place]   [address] [phone] ? [phone] ? FAX [phone] Web site: [email or web address]

## 2020-12-16 NOTE — Anesthesia Postprocedure Evaluation (Signed)
Anesthesia Post Note  Patient: James Branch  Procedure(s) Performed: UMBILICAL HERNIA REPAIR WITH MESH (Abdomen) INSERTION OF MESH (Abdomen)     Patient location during evaluation: PACU Anesthesia Type: General Level of consciousness: awake and alert and oriented Pain management: pain level controlled Vital Signs Assessment: post-procedure vital signs reviewed and stable Respiratory status: spontaneous breathing, nonlabored ventilation and respiratory function stable Cardiovascular status: blood pressure returned to baseline and stable Postop Assessment: no apparent nausea or vomiting Anesthetic complications: no   No notable events documented.  Last Vitals:  Vitals:   12/16/20 1023 12/16/20 1038  BP: 126/63 132/73  Pulse: 64 (!) 59  Resp: 18 16  Temp:  36.6 C  SpO2: 94% 98%    Last Pain:  Vitals:   12/16/20 1038  TempSrc:   PainSc: 0-No pain                 Crestina Strike A.

## 2020-12-16 NOTE — Anesthesia Procedure Notes (Signed)
Procedure Name: LMA Insertion Date/Time: 12/16/2020 9:10 AM Performed by: Carolan Clines, CRNA Pre-anesthesia Checklist: Patient identified, Emergency Drugs available, Suction available and Patient being monitored Patient Re-evaluated:Patient Re-evaluated prior to induction Oxygen Delivery Method: Circle System Utilized Preoxygenation: Pre-oxygenation with 100% oxygen Induction Type: IV induction LMA: LMA inserted LMA Size: 5.0 Number of attempts: 1 Placement Confirmation: positive ETCO2 Tube secured with: Tape Dental Injury: Teeth and Oropharynx as per pre-operative assessment

## 2020-12-17 ENCOUNTER — Encounter (HOSPITAL_COMMUNITY): Payer: Self-pay | Admitting: Surgery

## 2020-12-24 ENCOUNTER — Encounter: Payer: Self-pay | Admitting: Family Medicine

## 2020-12-30 DIAGNOSIS — G4733 Obstructive sleep apnea (adult) (pediatric): Secondary | ICD-10-CM | POA: Diagnosis not present

## 2021-01-06 ENCOUNTER — Ambulatory Visit: Payer: Medicare Other | Admitting: Cardiology

## 2021-01-13 ENCOUNTER — Ambulatory Visit: Payer: Medicare Other

## 2021-01-26 ENCOUNTER — Other Ambulatory Visit: Payer: Self-pay

## 2021-01-26 ENCOUNTER — Ambulatory Visit (INDEPENDENT_AMBULATORY_CARE_PROVIDER_SITE_OTHER): Payer: Medicare Other | Admitting: *Deleted

## 2021-01-26 DIAGNOSIS — Z23 Encounter for immunization: Secondary | ICD-10-CM | POA: Diagnosis not present

## 2021-02-10 ENCOUNTER — Ambulatory Visit (INDEPENDENT_AMBULATORY_CARE_PROVIDER_SITE_OTHER): Payer: Medicare Other

## 2021-02-10 ENCOUNTER — Other Ambulatory Visit: Payer: Self-pay

## 2021-02-10 ENCOUNTER — Other Ambulatory Visit (HOSPITAL_BASED_OUTPATIENT_CLINIC_OR_DEPARTMENT_OTHER): Payer: Self-pay

## 2021-02-10 ENCOUNTER — Ambulatory Visit: Payer: Medicare Other | Attending: Internal Medicine

## 2021-02-10 VITALS — Ht 70.0 in | Wt 340.0 lb

## 2021-02-10 DIAGNOSIS — Z122 Encounter for screening for malignant neoplasm of respiratory organs: Secondary | ICD-10-CM | POA: Diagnosis not present

## 2021-02-10 DIAGNOSIS — Z0000 Encounter for general adult medical examination without abnormal findings: Secondary | ICD-10-CM

## 2021-02-10 DIAGNOSIS — Z23 Encounter for immunization: Secondary | ICD-10-CM

## 2021-02-10 DIAGNOSIS — F17200 Nicotine dependence, unspecified, uncomplicated: Secondary | ICD-10-CM

## 2021-02-10 DIAGNOSIS — J441 Chronic obstructive pulmonary disease with (acute) exacerbation: Secondary | ICD-10-CM

## 2021-02-10 DIAGNOSIS — R059 Cough, unspecified: Secondary | ICD-10-CM

## 2021-02-10 MED ORDER — PFIZER COVID-19 VAC BIVALENT 30 MCG/0.3ML IM SUSP
INTRAMUSCULAR | 0 refills | Status: DC
  Filled 2021-02-10: qty 0.3, 1d supply, fill #0

## 2021-02-10 NOTE — Progress Notes (Signed)
   Covid-19 Vaccination Clinic  Name:  VERLIE HELLENBRAND    MRN: [id number] DOB: 05-23-1955  02/10/2021  Mr. Critzer was observed post Covid-19 immunization for 15 minutes without incident. He was provided with Vaccine Information Sheet and instruction to access the V-Safe system.   Mr. Billups was instructed to call 911 with any severe reactions post vaccine: Difficulty breathing  Swelling of face and throat  A fast heartbeat  A bad rash all over body  Dizziness and weakness   Immunizations Administered     Name Date Dose VIS Date Route   Pfizer Covid-19 Vaccine Bivalent Booster 02/10/2021 10:37 AM 0.3 mL 12/15/2020 Intramuscular   Manufacturer: [place]   Lot: [id number]   [place]: [phone]

## 2021-02-10 NOTE — Patient Instructions (Signed)
James Branch , Thank you for taking time to come for your Medicare Wellness Visit. I appreciate your ongoing commitment to your health goals. Please review the following plan we discussed and let me know if I can assist you in the future.   Screening recommendations/referrals: Colonoscopy: Done 03/05/2019 Repeat every 7 years. Due 2027. Recommended yearly ophthalmology/optometry visit for glaucoma screening and checkup Recommended yearly dental visit for hygiene and checkup  Vaccinations: Influenza vaccine: Done 01/26/2021 Repeat annually  Pneumococcal vaccine: Done 01/31/2019 and 01/26/2021 Tdap vaccine: Done 10/26/2014 Repeat in 10 years  Shingles vaccine: Shingrix discussed. Please contact your pharmacy for coverage information.     Covid-19: done 05/23/2019, 06/17/2019, 01/22/2020 and 10/29/2020.  Advanced directives: Advance directive discussed with you today. Even though you declined this today, please call our office should you change your mind, and we can give you the proper paperwork for you to fill out.   Conditions/risks identified: Aim for 30 minutes of exercise or walking each day, drink 6-8 glasses of water and eat lots of fruits and vegetables.   Next appointment: Follow up in one year for your annual wellness visit. 2023.  Preventive Care 67 Years and Older, Male  Preventive care refers to lifestyle choices and visits with your health care provider that can promote health and wellness. What does preventive care include? A yearly physical exam. This is also called an annual well check. Dental exams once or twice a year. Routine eye exams. Ask your health care provider how often you should have your eyes checked. Personal lifestyle choices, including: Daily care of your teeth and gums. Regular physical activity. Eating a healthy diet. Avoiding tobacco and drug use. Limiting alcohol use. Practicing safe sex. Taking low doses of aspirin every day. Taking vitamin and mineral  supplements as recommended by your health care provider. What happens during an annual well check? The services and screenings done by your health care provider during your annual well check will depend on your age, overall health, lifestyle risk factors, and family history of disease. Counseling  Your health care provider may ask you questions about your: Alcohol use. Tobacco use. Drug use. Emotional well-being. Home and relationship well-being. Sexual activity. Eating habits. History of falls. Memory and ability to understand (cognition). Work and work Statistician. Screening  You may have the following tests or measurements: Height, weight, and BMI. Blood pressure. Lipid and cholesterol levels. These may be checked every 5 years, or more frequently if you are over 49 years old. Skin check. Lung cancer screening. You may have this screening every year starting at age 97 if you have a 30-pack-year history of smoking and currently smoke or have quit within the past 15 years. Fecal occult blood test (FOBT) of the stool. You may have this test every year starting at age 75. Flexible sigmoidoscopy or colonoscopy. You may have a sigmoidoscopy every 5 years or a colonoscopy every 10 years starting at age 89. Prostate cancer screening. Recommendations will vary depending on your family history and other risks. Hepatitis C blood test. Hepatitis B blood test. Sexually transmitted disease (STD) testing. Diabetes screening. This is done by checking your blood sugar (glucose) after you have not eaten for a while (fasting). You may have this done every 1-3 years. Abdominal aortic aneurysm (AAA) screening. You may need this if you are a current or former smoker. Osteoporosis. You may be screened starting at age 83 if you are at high risk. Talk with your health care provider about your  test results, treatment options, and if necessary, the need for more tests. Vaccines  Your health care provider  may recommend certain vaccines, such as: Influenza vaccine. This is recommended every year. Tetanus, diphtheria, and acellular pertussis (Tdap, Td) vaccine. You may need a Td booster every 10 years. Zoster vaccine. You may need this after age 96. Pneumococcal 13-valent conjugate (PCV13) vaccine. One dose is recommended after age 86. Pneumococcal polysaccharide (PPSV23) vaccine. One dose is recommended after age 34. Talk to your health care provider about which screenings and vaccines you need and how often you need them. This information is not intended to replace advice given to you by your health care provider. Make sure you discuss any questions you have with your health care provider. Document Released: 04/30/2015 Document Revised: 12/22/2015 Document Reviewed: 02/02/2015 Elsevier Interactive Patient Education  2017 [place] Prevention in the Home Falls can cause injuries. They can happen to people of all ages. There are many things you can do to make your home safe and to help prevent falls. What can I do on the outside of my home? Regularly fix the edges of walkways and driveways and fix any cracks. Remove anything that might make you trip as you walk through a door, such as a raised step or threshold. Trim any bushes or trees on the path to your home. Use bright outdoor lighting. Clear any walking paths of anything that might make someone trip, such as rocks or tools. Regularly check to see if handrails are loose or broken. Make sure that both sides of any steps have handrails. Any raised decks and porches should have guardrails on the edges. Have any leaves, snow, or ice cleared regularly. Use sand or salt on walking paths during winter. Clean up any spills in your garage right away. This includes oil or grease spills. What can I do in the bathroom? Use night lights. Install grab bars by the toilet and in the tub and shower. Do not use towel bars as grab bars. Use  non-skid mats or decals in the tub or shower. If you need to sit down in the shower, use a plastic, non-slip stool. Keep the floor dry. Clean up any water that spills on the floor as soon as it happens. Remove soap buildup in the tub or shower regularly. Attach bath mats securely with double-sided non-slip rug tape. Do not have throw rugs and other things on the floor that can make you trip. What can I do in the bedroom? Use night lights. Make sure that you have a light by your bed that is easy to reach. Do not use any sheets or blankets that are too big for your bed. They should not hang down onto the floor. Have a firm chair that has side arms. You can use this for support while you get dressed. Do not have throw rugs and other things on the floor that can make you trip. What can I do in the kitchen? Clean up any spills right away. Avoid walking on wet floors. Keep items that you use a lot in easy-to-reach places. If you need to reach something above you, use a strong step stool that has a grab bar. Keep electrical cords out of the way. Do not use floor polish or wax that makes floors slippery. If you must use wax, use non-skid floor wax. Do not have throw rugs and other things on the floor that can make you trip. What can I do with my  stairs? Do not leave any items on the stairs. Make sure that there are handrails on both sides of the stairs and use them. Fix handrails that are broken or loose. Make sure that handrails are as long as the stairways. Check any carpeting to make sure that it is firmly attached to the stairs. Fix any carpet that is loose or worn. Avoid having throw rugs at the top or bottom of the stairs. If you do have throw rugs, attach them to the floor with carpet tape. Make sure that you have a light switch at the top of the stairs and the bottom of the stairs. If you do not have them, ask someone to add them for you. What else can I do to help prevent falls? Wear  shoes that: Do not have high heels. Have rubber bottoms. Are comfortable and fit you well. Are closed at the toe. Do not wear sandals. If you use a stepladder: Make sure that it is fully opened. Do not climb a closed stepladder. Make sure that both sides of the stepladder are locked into place. Ask someone to hold it for you, if possible. Clearly mark and make sure that you can see: Any grab bars or handrails. First and last steps. Where the edge of each step is. Use tools that help you move around (mobility aids) if they are needed. These include: Canes. Walkers. Scooters. Crutches. Turn on the lights when you go into a dark area. Replace any light bulbs as soon as they burn out. Set up your furniture so you have a clear path. Avoid moving your furniture around. If any of your floors are uneven, fix them. If there are any pets around you, be aware of where they are. Review your medicines with your doctor. Some medicines can make you feel dizzy. This can increase your chance of falling. Ask your doctor what other things that you can do to help prevent falls. This information is not intended to replace advice given to you by your health care provider. Make sure you discuss any questions you have with your health care provider. Document Released: 01/28/2009 Document Revised: 09/09/2015 Document Reviewed: 05/08/2014 Elsevier Interactive Patient Education  2017 [employer].

## 2021-02-10 NOTE — Progress Notes (Signed)
Subjective:   James Branch is a 65 y.o. male who presents for Medicare Annual/Subsequent preventive examination. Virtual Visit via Telephone Note  I connected with  James Branch on 02/10/21 at  9:00 AM EDT by telephone and verified that I am speaking with the correct person using two identifiers.  Location: Patient: Home Provider: [facility] Persons participating in the virtual visit: patient/Nurse Health Advisor   I discussed the limitations, risks, security and privacy concerns of performing an evaluation and management service by telephone and the availability of in person appointments. The patient expressed understanding and agreed to proceed.  Interactive audio and video telecommunications were attempted between this nurse and patient, however failed, due to patient having technical difficulties OR patient did not have access to video capability.  We continued and completed visit with audio only.  Some vital signs may be absent or patient reported.   James Driver, LPN  Review of Systems     Cardiac Risk Factors include: advanced age (>37men, >57 women);hypertension;male gender;obesity (BMI >30kg/m2);sedentary lifestyle     Objective:    Today's Vitals   02/10/21 0859  Weight: (!) 340 lb (154.2 kg)  Height: 5\' 10"  (1.778 m)   Body mass index is 48.78 kg/m.  Advanced Directives 02/10/2021 12/08/2020 10/12/2020 01/31/2019 12/30/2016  Does Patient Have a Medical Advance Directive? No No No No No  Would patient like information on creating a medical advance directive? No - Patient declined No - Patient declined No - Patient declined - -    Current Medications (verified) Outpatient Encounter Medications as of 02/10/2021  Medication Sig   albuterol (VENTOLIN HFA) 108 (90 Base) MCG/ACT inhaler INHALE 2 PUFFS INTO THE LUNGS 4 (FOUR) TIMES DAILY. (Patient taking differently: Inhale 2 puffs into the lungs every 6 (six) hours as needed for wheezing or shortness of breath.  INHALE 2 PUFFS INTO THE LUNGS 4 (FOUR) TIMES DAILY.)   atorvastatin (LIPITOR) 20 MG tablet Take 1 tablet (20 mg total) by mouth daily.   betamethasone dipropionate 0.05 % cream Apply 1 application topically 2 (two) times daily as needed (irritation).   furosemide (LASIX) 40 MG tablet TAKE 1 TABLET BY MOUTH EVERY DAY (Patient taking differently: Take 40 mg by mouth daily.)   guaiFENesin (MUCINEX) 600 MG 12 hr tablet Take 1 tablet (600 mg total) by mouth 2 (two) times daily as needed for cough or to loosen phlegm.   lisinopril (ZESTRIL) 20 MG tablet Take 1 tablet by mouth daily.   nitroGLYCERIN (NITROSTAT) 0.4 MG SL tablet Place 0.4 mg under the tongue every 5 (five) minutes as needed for chest pain.   Omega-3 Fatty Acids (FISH OIL) 1000 MG CAPS Take 1,000 mg by mouth 3 (three) times a week.   oxyCODONE (OXY IR/ROXICODONE) 5 MG immediate release tablet Take 1 tablet (5 mg total) by mouth every 6 (six) hours as needed for moderate pain or severe pain.   PFIZER-BIONT COVID-19 VAC-TRIS SUSP injection    sodium chloride (OCEAN) 0.65 % SOLN nasal spray Place 1 spray into both nostrils as needed for congestion.   [DISCONTINUED] diltiazem 2 % GEL Apply 1 application topically 3 (three) times daily. X 8 weeks (Patient not taking: Reported on 12/01/2020)   [DISCONTINUED] lisinopril (ZESTRIL) 20 MG tablet TAKE ONE TABLET BY MOUTH ONCE DAILY (Patient taking differently: Take 20 mg by mouth daily.)   [DISCONTINUED] metoprolol tartrate (LOPRESSOR) 25 MG tablet TAKE ONE TABLET BY MOUTH TWICE DAILY (Patient taking differently: Take 25 mg by  mouth 2 (two) times daily.)   No facility-administered encounter medications on file as of 02/10/2021.    Allergies (verified) Penicillins   History: Past Medical History:  Diagnosis Date   Arthritis    knees   Asthma    CAD (coronary artery disease)    COPD (chronic obstructive pulmonary disease) (HCC)    Diastolic dysfunction    GERD (gastroesophageal reflux  disease)    Hyperlipidemia    Hypertension    Obesity    OSA on CPAP    ahi-84, 18 CWP   Sleep apnea    wears cpap    Smoker    Umbilical hernia    Past Surgical History:  Procedure Laterality Date   CARDIAC CATHETERIZATION     COLONOSCOPY  2007   INSERTION OF MESH N/A 12/16/2020   Procedure: INSERTION OF MESH;  Surgeon: Coralie Keens, MD;  Location: [place];  Service: General;  Laterality: N/A;   LEFT HEART CATH AND CORONARY ANGIOGRAPHY N/A 10/12/2020   Procedure: LEFT HEART CATH AND CORONARY ANGIOGRAPHY;  Surgeon: [name], MD;  Location: [facility] [facility];  Service: Cardiovascular;  Laterality: N/A;   TONSILLECTOMY  [id number]   UMBILICAL HERNIA REPAIR N/A 12/16/2020   Procedure: UMBILICAL HERNIA REPAIR WITH MESH;  Surgeon: Coralie Keens, MD;  Location: [place];  Service: General;  Laterality: N/A;  LMA   Family History  Problem Relation Age of Onset   Hypertension Mother    Hypertension Father    Throat cancer Father    Colon cancer Neg Hx    Colon polyps Neg Hx    Esophageal cancer Neg Hx    Rectal cancer Neg Hx    Stomach cancer Neg Hx    Social History   Socioeconomic History   Marital status: Married    Spouse name: James Branch   Number of children: 4   Years of education: Not on file   Highest education level: Not on file  Occupational History   Occupation: Retired  Tobacco Use   Smoking status: Former    Packs/day: 0.50    Years: 20.00    Pack years: 10.00    Types: Cigarettes    Quit date: 02/17/2019    Years since quitting: 1.9   Smokeless tobacco: Never  Vaping Use   Vaping Use: Never used  Substance and Sexual Activity   Alcohol use: No    Alcohol/week: 0.0 standard drinks   Drug use: No   Sexual activity: Yes    Birth control/protection: Condom  Other Topics Concern   Not on file  Social History Narrative   4 children, 18 grandchildren.   Social Determinants of Health   Financial Resource Strain: Low Risk    Difficulty of Paying  Living Expenses: Not hard at all  Food Insecurity: No Food Insecurity   Worried About Charity fundraiser in the Last Year: Never true   [place] in the Last Year: Never true  Transportation Needs: No Transportation Needs   Lack of Transportation (Medical): No   Lack of Transportation (Non-Medical): No  Physical Activity: Insufficiently Active   Days of Exercise per Week: 5 days   Minutes of Exercise per Session: 10 min  Stress: No Stress Concern Present   Feeling of Stress : Not at all  Social Connections: Moderately Isolated   Frequency of Communication with Friends and Family: More than three times a week   Frequency of Social Gatherings with Friends and Family: More than  three times a week   Attends Religious Services: Never   Active Member of Clubs or Organizations: No   Attends Archivist Meetings: Never   Marital Status: Married    Tobacco Counseling Counseling given: Not Answered   Clinical Intake:  Pre-visit preparation completed: Yes  Pain : No/denies pain     BMI - recorded: 48.78 Nutritional Status: BMI > 30  Obese Nutritional Risks: None Diabetes: No  How often do you need to have someone help you when you read instructions, pamphlets, or other written materials from your doctor or pharmacy?: 1 - Never  Diabetic?NO  Interpreter Needed?: No  Information entered by :: [place] of Daily Living In your present state of health, do you have any difficulty performing the following activities: 02/10/2021 12/08/2020  Hearing? Y Y  Comment - Wears hearind aids  Vision? N N  Difficulty concentrating or making decisions? N N  Walking or climbing stairs? N N  Dressing or bathing? N N  Doing errands, shopping? N N  Preparing Food and eating ? N -  Using the Toilet? N -  In the past six months, have you accidently leaked urine? N -  Do you have problems with loss of bowel control? N -  Managing your Medications? N -   Managing your Finances? N -  Housekeeping or managing your Housekeeping? N -  Some recent data might be hidden    Patient Care Team: [name], MD as PCP - General (Family Medicine) Edythe Clarity, [facility] as Pharmacist (Pharmacist)  Indicate any recent Medical Services you may have received from other than [facility] providers in the past year (date may be approximate).     Assessment:   This is a routine wellness examination for [place].  Hearing/Vision screen Hearing Screening - Comments:: Hearing issues. Wears hearing aids. Vision Screening - Comments:: Glasses. Jodene Nam, MD. [facility]  Dietary issues and exercise activities discussed: Current Exercise Habits: Home exercise routine, Type of exercise: walking, Time (Minutes): 10, Frequency (Times/Week): 5, Weekly Exercise (Minutes/Week): 50, Intensity: Mild, Exercise limited by: cardiac condition(s);orthopedic condition(s)   Goals Addressed             This Visit's Progress    Exercise 3x per week (30 min per time)       Lose weight.       Depression Screen PHQ 2/9 Scores 02/10/2021 04/08/2020 01/31/2019 01/31/2019 05/08/2017 12/11/2016 06/27/2016  PHQ - 2 Score 0 0 0 0 0 0 0  PHQ- 9 Score - - - - - - 0    Fall Risk Fall Risk  02/10/2021 04/08/2020 01/31/2019 01/31/2019 06/27/2016  Falls in the past year? 0 0 0 0 No  Number falls in past yr: 0 0 0 0 -  Injury with Fall? 0 0 0 0 -  Risk for fall due to : Impaired balance/gait;Impaired mobility;Impaired vision - - - -  Follow up Falls prevention discussed - Falls evaluation completed - -    FALL RISK PREVENTION PERTAINING TO THE HOME:  Any stairs in or around the home? Yes  If so, are there any without handrails? No  Home free of loose throw rugs in walkways, pet beds, electrical cords, etc? Yes  Adequate lighting in your home to reduce risk of falls? Yes   ASSISTIVE DEVICES UTILIZED TO PREVENT FALLS:  Life alert? No  Use of a cane, walker or  w/c? No  Grab bars in the bathroom? No  Shower  chair or bench in shower? Yes  Elevated toilet seat or a handicapped toilet? No   TIMED UP AND GO:  Was the test performed? No . Phone visit.   Cognitive Function:     6CIT Screen 02/10/2021  What Year? 0 points  What month? 0 points  What time? 0 points  Count back from 20 0 points  Months in reverse 0 points  Repeat phrase 0 points  Total Score 0    Immunizations Immunization History  Administered Date(s) Administered   Fluad Quad(high Dose 65+) 01/26/2021   Influenza,inj,Quad PF,6+ Mos 03/14/2017, 03/04/2018, 01/31/2019, 02/05/2020   Influenza-Unspecified 02/28/2016, 02/24/2017   PFIZER(Purple Top)SARS-COV-2 Vaccination 05/23/2019, 06/17/2019, 01/22/2020, 10/29/2020   PNEUMOCOCCAL CONJUGATE-20 01/26/2021   Pneumococcal Polysaccharide-23 01/31/2019   Tdap 10/26/2014   Zoster, Live 03/28/2016    TDAP status: Up to date  Flu Vaccine status: Up to date  Pneumococcal vaccine status: Up to date  Covid-19 vaccine status: Completed vaccines  Qualifies for Shingles Vaccine? Yes   Zostavax completed Yes   Shingrix Completed?: No.    Education has been provided regarding the importance of this vaccine. Patient has been advised to call insurance company to determine out of pocket expense if they have not yet received this vaccine. Advised may also receive vaccine at local pharmacy or Health Dept. Verbalized acceptance and understanding.  Screening Tests Health Maintenance  Topic Date Due   Zoster Vaccines- Shingrix (1 of 2) Never done   COVID-19 Vaccine (5 - Booster for Pfizer series) 12/24/2020   TETANUS/TDAP  10/25/2024   COLONOSCOPY (Pts 45-16yrs Insurance coverage will need to be confirmed)  03/04/2026   Pneumonia Vaccine 28+ Years old  Completed   INFLUENZA VACCINE  Completed   Hepatitis C Screening  Completed   HPV VACCINES  Aged Out    Health Maintenance  Health Maintenance Due  Topic Date Due   Zoster  Vaccines- Shingrix (1 of 2) Never done   COVID-19 Vaccine (5 - Booster for Pfizer series) 12/24/2020    Colorectal cancer screening: Type of screening: Colonoscopy. Completed 03/05/2019. Repeat every 7 years  Lung Cancer Screening: (Low Dose CT Chest recommended if Age 78-80 years, 30 pack-year currently smoking OR have quit w/in 15years.) does qualify.   Lung Cancer Screening Referral: Order placed today. Last screening done 03/17/2020.  Additional Screening:  Hepatitis C Screening: does qualify; Completed 02/02/2020  Vision Screening: Recommended annual ophthalmology exams for early detection of glaucoma and other disorders of the eye. Is the patient up to date with their annual eye exam?  No  Who is the provider or what is the name of the office in which the patient attends annual eye exams? Dr. Jodene Nam, [facility]. If pt is not established with a provider, would they like to be referred to a provider to establish care? No .   Dental Screening: Recommended annual dental exams for proper oral hygiene  Community Resource Referral / Chronic Care Management: CRR required this visit?  No   CCM required this visit?  No      Plan:     I have personally reviewed and noted the following in the patient's chart:   Medical and social history Use of alcohol, tobacco or illicit drugs  Current medications and supplements including opioid prescriptions. Patient is currently taking opioid prescriptions. Information provided to patient regarding non-opioid alternatives. Patient advised to discuss non-opioid treatment plan with their provider. Functional ability and status Nutritional status Physical activity Advanced directives List of other physicians  Hospitalizations, surgeries, and ER visits in previous 12 months Vitals Screenings to include cognitive, depression, and falls Referrals and appointments  In addition, I have reviewed and discussed with patient certain  preventive protocols, quality metrics, and best practice recommendations. A written personalized care plan for preventive services as well as general preventive health recommendations were provided to patient.     James Driver, LPN   [id number]   Nurse Notes: Pt states he is doing well. Up to date on health maintenance. Discussed eye exam. Pt states he will call and schedule. Discussed Covid booster and Shingles vaccine. Lung cancer screening ordered, repeat was recommended. Last screening was done 03/17/2020.

## 2021-02-18 ENCOUNTER — Other Ambulatory Visit: Payer: Self-pay | Admitting: Family Medicine

## 2021-02-21 ENCOUNTER — Telehealth: Payer: Self-pay | Admitting: *Deleted

## 2021-02-21 DIAGNOSIS — N471 Phimosis: Secondary | ICD-10-CM | POA: Diagnosis not present

## 2021-02-21 NOTE — Telephone Encounter (Signed)
Appointment scheduled.

## 2021-02-21 NOTE — Telephone Encounter (Signed)
-----   Message from [name], MD sent at 02/21/2021  4:16 PM EST ----- I would like to see him to discuss weight loss meds. ----- Message ----- From: Irine Seal, MD Sent: 02/21/2021   9:41 AM EST To: [name], MD  I am going to be sending James Branch to a reconstructive urologist at [facility] for management of his buried penis and severe phimosis.    His weight will be an obstacle to success with that.   He really needs to try to get some weight off.   Is there a good structured program or medical therapy that might be appropriate for him?     Thanks,  Jenny Reichmann

## 2021-02-23 ENCOUNTER — Telehealth: Payer: Self-pay | Admitting: Pharmacist

## 2021-02-23 ENCOUNTER — Telehealth: Payer: Self-pay | Admitting: *Deleted

## 2021-02-23 MED ORDER — ALBUTEROL SULFATE HFA 108 (90 BASE) MCG/ACT IN AERS: INHALATION_SPRAY | RESPIRATORY_TRACT | 11 refills | Status: DC

## 2021-02-23 NOTE — Progress Notes (Addendum)
    Chronic Care Management Pharmacy Assistant   Name: James Branch  MRN: [id number] DOB: 10-26-1955  Reason for Encounter: Medication Review/Medication Coordination Call   Medications: Outpatient Encounter Medications as of 02/23/2021  Medication Sig Note   albuterol (VENTOLIN HFA) 108 (90 Base) MCG/ACT inhaler INHALE 2 PUFFS INTO THE LUNGS 4 (FOUR) TIMES DAILY. (Patient taking differently: Inhale 2 puffs into the lungs every 6 (six) hours as needed for wheezing or shortness of breath. INHALE 2 PUFFS INTO THE LUNGS 4 (FOUR) TIMES DAILY.)    atorvastatin (LIPITOR) 20 MG tablet TAKE ONE TABLET BY MOUTH ONCE DAILY    betamethasone dipropionate 0.05 % cream Apply 1 application topically 2 (two) times daily as needed (irritation).    COVID-19 mRNA bivalent vaccine, Pfizer, (PFIZER COVID-19 VAC BIVALENT) injection Inject into the muscle.    furosemide (LASIX) 40 MG tablet TAKE ONE TABLET BY MOUTH ONCE DAILY    guaiFENesin (MUCINEX) 600 MG 12 hr tablet Take 1 tablet (600 mg total) by mouth 2 (two) times daily as needed for cough or to loosen phlegm.    lisinopril (ZESTRIL) 20 MG tablet Take 1 tablet by mouth daily.    nitroGLYCERIN (NITROSTAT) 0.4 MG SL tablet Place 0.4 mg under the tongue every 5 (five) minutes as needed for chest pain. 12/16/2020: Never used   Omega-3 Fatty Acids (FISH OIL) 1000 MG CAPS Take 1,000 mg by mouth 3 (three) times a week.    oxyCODONE (OXY IR/ROXICODONE) 5 MG immediate release tablet Take 1 tablet (5 mg total) by mouth every 6 (six) hours as needed for moderate pain or severe pain.    PFIZER-BIONT COVID-19 VAC-TRIS SUSP injection     sodium chloride (OCEAN) 0.65 % SOLN nasal spray Place 1 spray into both nostrils as needed for congestion.    No facility-administered encounter medications on file as of 02/23/2021.    Reviewed chart for medication changes ahead of medication coordination call.  No Consults visits since last care coordination call.  Office  Visit: 02/10/21 [name], LPN. For [employer]. No medication changes.   Hospital Visit: 12/16/20 [facility] (3 Hours) Coralie Keens, MD. For Hernia Repair with Mesh.  No medication changes indicated.  BP Readings from Last 3 Encounters:  12/16/20 132/73  12/08/20 (!) 109/48  10/20/20 120/64    No results found for: HGBA1C   Patient obtains medications through Vials  90 Days   Last adherence delivery included:  Albuterol Sulfate 90 mcg daily Atorvastatin 20 mg daily Furosemide 40 mg daily Metoprolol tartrate 25 mg twice daily  Lisinopril 20 mg daily  Patient declined meds last month: Betamethasone Diproprionate Cr 0.05% (PRN) Nitroglycerin 0.4mg  SL (PRN  Patient is due for next adherence delivery on: 03/07/21.  Called patient and reviewed medications and coordinated delivery.  This delivery to include: Albuterol Sulfate 90 mcg daily Atorvastatin 20 mg daily Furosemide 40 mg daily Metoprolol tartrate 25 mg twice daily  Lisinopril 20 mg daily  Patient declined the following medications: Betamethasone Diproprionate Cr 0.05% (PRN) Nitroglycerin 0.4mg  SL (PRN  Patient needs refills for: Albuterol Sulfate 90 mcg daily-requested.  Confirmed delivery date of 03/07/21, advised patient that pharmacy will contact them the morning of delivery.  Follow-Up:Pharmacist Review  Charlann Lange, RMA Clinical Pharmacist Assistant [phone]  8 minutes spent in review, coordination, and documentation.  Reviewed by: Beverly Milch, PharmD Clinical Pharmacist [phone]

## 2021-02-23 NOTE — Telephone Encounter (Signed)
Prescription sent to pharmacy.

## 2021-02-23 NOTE — Telephone Encounter (Signed)
-----   Message from Rosetta Posner sent at 02/23/2021  3:34 PM EST ----- Regarding: Medication Refill Hi can you send a refill for Albuterol Sulfate 90 mcgdaily to [facility], thanks.

## 2021-02-24 ENCOUNTER — Encounter: Payer: Self-pay | Admitting: Family Medicine

## 2021-02-24 ENCOUNTER — Other Ambulatory Visit: Payer: Self-pay

## 2021-02-24 ENCOUNTER — Ambulatory Visit (INDEPENDENT_AMBULATORY_CARE_PROVIDER_SITE_OTHER): Payer: Medicare Other | Admitting: Family Medicine

## 2021-02-24 DIAGNOSIS — R739 Hyperglycemia, unspecified: Secondary | ICD-10-CM | POA: Diagnosis not present

## 2021-02-24 NOTE — Progress Notes (Signed)
Subjective:    Patient ID: James Branch, male    DOB: 07/31/1955, 65 y.o.   MRN: [id number]   Patient is here today to discuss strategies for weight loss.  He is about to have an elective procedure performed by his urologist for a retracted penis.  His urologist has recommended extensive weight loss to facilitate his recovery from the surgery and improve outcomes.  The patient has a history of hyperglycemia with a blood sugar of 109 recently on lab work.  He does not have a documented history of diabetes.  Due to his age and his medical comorbidities and hypertension he is a poor candidate for Adipex.  He has never tried Topamax or a GLP-1 agonist.  He is trying exercise.  He states that his diet is not an issue as he states "I am eating very little". Past Medical History:  Diagnosis Date   Arthritis    knees   Asthma    CAD (coronary artery disease)    COPD (chronic obstructive pulmonary disease) (HCC)    Diastolic dysfunction    GERD (gastroesophageal reflux disease)    Hyperlipidemia    Hypertension    Obesity    OSA on CPAP    ahi-84, 18 CWP   Sleep apnea    wears cpap    Smoker    Umbilical hernia    Past Surgical History:  Procedure Laterality Date   CARDIAC CATHETERIZATION     COLONOSCOPY  2007   INSERTION OF MESH N/A 12/16/2020   Procedure: INSERTION OF MESH;  Surgeon: Coralie Keens, MD;  Location: [facility];  Service: General;  Laterality: N/A;   LEFT HEART CATH AND CORONARY ANGIOGRAPHY N/A 10/12/2020   Procedure: LEFT HEART CATH AND CORONARY ANGIOGRAPHY;  Surgeon: [name], MD;  Location: [facility];  Service: Cardiovascular;  Laterality: N/A;   TONSILLECTOMY  [id number]   UMBILICAL HERNIA REPAIR N/A 12/16/2020   Procedure: UMBILICAL HERNIA REPAIR WITH MESH;  Surgeon: Coralie Keens, MD;  Location: [facility];  Service: General;  Laterality: N/A;  LMA   Current Outpatient Medications on File Prior to Visit  Medication Sig Dispense Refill   albuterol  (VENTOLIN HFA) 108 (90 Base) MCG/ACT inhaler INHALE 2 PUFFS INTO THE LUNGS 4 (FOUR) TIMES DAILY. 8.5 each 11   atorvastatin (LIPITOR) 20 MG tablet TAKE ONE TABLET BY MOUTH ONCE DAILY 90 tablet 3   betamethasone dipropionate 0.05 % cream Apply 1 application topically 2 (two) times daily as needed (irritation). 45 g 3   furosemide (LASIX) 40 MG tablet TAKE ONE TABLET BY MOUTH ONCE DAILY 90 tablet 3   lisinopril (ZESTRIL) 20 MG tablet Take 1 tablet by mouth daily.     nitroGLYCERIN (NITROSTAT) 0.4 MG SL tablet Place 0.4 mg under the tongue every 5 (five) minutes as needed for chest pain.     Omega-3 Fatty Acids (FISH OIL) 1000 MG CAPS Take 1,000 mg by mouth 3 (three) times a week.     sodium chloride (OCEAN) 0.65 % SOLN nasal spray Place 1 spray into both nostrils as needed for congestion. 88 mL 0   COVID-19 mRNA bivalent vaccine, Pfizer, (PFIZER COVID-19 VAC BIVALENT) injection Inject into the muscle. 0.3 mL 0   guaiFENesin (MUCINEX) 600 MG 12 hr tablet Take 1 tablet (600 mg total) by mouth 2 (two) times daily as needed for cough or to loosen phlegm. 30 tablet 0   oxyCODONE (OXY IR/ROXICODONE) 5 MG immediate release tablet Take 1 tablet (  5 mg total) by mouth every 6 (six) hours as needed for moderate pain or severe pain. 25 tablet 0   PFIZER-BIONT COVID-19 VAC-TRIS SUSP injection      No current facility-administered medications on file prior to visit.   Allergies  Allergen Reactions   Penicillins Swelling   Social History   Socioeconomic History   Marital status: Married    Spouse name: [name]   Number of children: 4   Years of education: Not on file   Highest education level: Not on file  Occupational History   Occupation: Retired  Tobacco Use   Smoking status: Former    Packs/day: 0.50    Years: 20.00    Pack years: 10.00    Types: Cigarettes    Quit date: 02/17/2019    Years since quitting: 2.0   Smokeless tobacco: Never  Vaping Use   Vaping Use: Never used  Substance and  Sexual Activity   Alcohol use: No    Alcohol/week: 0.0 standard drinks   Drug use: No   Sexual activity: Yes    Birth control/protection: Condom  Other Topics Concern   Not on file  Social History Narrative   4 children, 18 grandchildren.   Social Determinants of Health   Financial Resource Strain: Low Risk    Difficulty of Paying Living Expenses: Not hard at all  Food Insecurity: No Food Insecurity   Worried About Charity fundraiser in the Last Year: Never true   [place] in the Last Year: Never true  Transportation Needs: No Transportation Needs   Lack of Transportation (Medical): No   Lack of Transportation (Non-Medical): No  Physical Activity: Insufficiently Active   Days of Exercise per Week: 5 days   Minutes of Exercise per Session: 10 min  Stress: No Stress Concern Present   Feeling of Stress : Not at all  Social Connections: Moderately Isolated   Frequency of Communication with Friends and Family: More than three times a week   Frequency of Social Gatherings with Friends and Family: More than three times a week   Attends Religious Services: Never   Marine scientist or Organizations: No   Attends Music therapist: Never   Marital Status: Married  Human resources officer Violence: Not At Risk   Fear of Current or Ex-Partner: No   Emotionally Abused: No   Physically Abused: No   Sexually Abused: No      Review of Systems  All other systems reviewed and are negative.     Objective:   Physical Exam Vitals reviewed.  HENT:     Right Ear: External ear normal.     Left Ear: External ear normal.     Nose: Nose normal.     Mouth/Throat:     Pharynx: No oropharyngeal exudate.  Cardiovascular:     Rate and Rhythm: Normal rate and regular rhythm.     Heart sounds: Normal heart sounds.  Pulmonary:     Effort: No accessory muscle usage or respiratory distress.     Breath sounds: Decreased breath sounds and wheezing present.           Assessment & Plan:  Morbid obesity ([place]) - Plan: Hemoglobin A1c, COMPLETE METABOLIC PANEL WITH GFR, CBC with Differential/Platelet  Elevated blood sugar - Plan: Hemoglobin A1c, COMPLETE METABOLIC PANEL WITH GFR, CBC with Differential/Platelet Given his history of hyperglycemia I will check an A1c.  If he has prediabetes I would recommend trying Ozempic or Trulicity  to facilitate weight loss and to help manage his prediabetes.  I also recommended gradually building up to 30 minutes of aerobic exercise 5 days a week and calorie restriction to less than 1500 cal a day

## 2021-02-25 LAB — CBC WITH DIFFERENTIAL/PLATELET
Absolute Monocytes: 479 cells/uL (ref 200–950)
Basophils Absolute: 50 cells/uL (ref 0–200)
Basophils Relative: 0.6 %
Eosinophils Absolute: 176 cells/uL (ref 15–500)
Eosinophils Relative: 2.1 %
HCT: 45.6 % (ref 38.5–50.0)
Hemoglobin: 15.7 g/dL (ref 13.2–17.1)
Lymphs Abs: 3108 cells/uL (ref 850–3900)
MCH: 31.8 pg (ref 27.0–33.0)
MCHC: 34.4 g/dL (ref 32.0–36.0)
MCV: 92.5 fL (ref 80.0–100.0)
MPV: 12.1 fL (ref 7.5–12.5)
Monocytes Relative: 5.7 %
Neutro Abs: 4586 cells/uL (ref 1500–7800)
Neutrophils Relative %: 54.6 %
Platelets: 257 10*3/uL (ref 140–400)
RBC: 4.93 10*6/uL (ref 4.20–5.80)
RDW: 12.5 % (ref 11.0–15.0)
Total Lymphocyte: 37 %
WBC: 8.4 10*3/uL (ref 3.8–10.8)

## 2021-02-25 LAB — COMPLETE METABOLIC PANEL WITH GFR
AG Ratio: 1.4 (calc) (ref 1.0–2.5)
ALT: 28 U/L (ref 9–46)
AST: 19 U/L (ref 10–35)
Albumin: 4.2 g/dL (ref 3.6–5.1)
Alkaline phosphatase (APISO): 77 U/L (ref 35–144)
BUN: 15 mg/dL (ref 7–25)
CO2: 27 mmol/L (ref 20–32)
Calcium: 9.8 mg/dL (ref 8.6–10.3)
Chloride: 100 mmol/L (ref 98–110)
Creat: 1.07 mg/dL (ref 0.70–1.35)
Globulin: 3 g/dL (calc) (ref 1.9–3.7)
Glucose, Bld: 106 mg/dL — ABNORMAL HIGH (ref 65–99)
Potassium: 4.1 mmol/L (ref 3.5–5.3)
Sodium: 140 mmol/L (ref 135–146)
Total Bilirubin: 0.7 mg/dL (ref 0.2–1.2)
Total Protein: 7.2 g/dL (ref 6.1–8.1)
eGFR: 76 mL/min/{1.73_m2} (ref >=60)

## 2021-02-25 LAB — HEMOGLOBIN A1C
Hgb A1c MFr Bld: 6 % of total Hgb — ABNORMAL HIGH (ref <5.7)
Mean Plasma Glucose: 126 mg/dL
eAG (mmol/L): 7 mmol/L

## 2021-02-28 ENCOUNTER — Other Ambulatory Visit: Payer: Medicare Other

## 2021-02-28 ENCOUNTER — Other Ambulatory Visit: Payer: Self-pay

## 2021-02-28 DIAGNOSIS — Z136 Encounter for screening for cardiovascular disorders: Secondary | ICD-10-CM | POA: Diagnosis not present

## 2021-02-28 DIAGNOSIS — Z13220 Encounter for screening for lipoid disorders: Secondary | ICD-10-CM

## 2021-02-28 DIAGNOSIS — I10 Essential (primary) hypertension: Secondary | ICD-10-CM

## 2021-02-28 DIAGNOSIS — E785 Hyperlipidemia, unspecified: Secondary | ICD-10-CM | POA: Diagnosis not present

## 2021-02-28 DIAGNOSIS — Z125 Encounter for screening for malignant neoplasm of prostate: Secondary | ICD-10-CM

## 2021-03-01 LAB — COMPLETE METABOLIC PANEL WITH GFR
AG Ratio: 1.4 (calc) (ref 1.0–2.5)
ALT: 33 U/L (ref 9–46)
AST: 24 U/L (ref 10–35)
Albumin: 4.3 g/dL (ref 3.6–5.1)
Alkaline phosphatase (APISO): 77 U/L (ref 35–144)
BUN: 18 mg/dL (ref 7–25)
CO2: 23 mmol/L (ref 20–32)
Calcium: 9.5 mg/dL (ref 8.6–10.3)
Chloride: 104 mmol/L (ref 98–110)
Creat: 1.06 mg/dL (ref 0.70–1.35)
Globulin: 3 g/dL (calc) (ref 1.9–3.7)
Glucose, Bld: 110 mg/dL — ABNORMAL HIGH (ref 65–99)
Potassium: 4.6 mmol/L (ref 3.5–5.3)
Sodium: 137 mmol/L (ref 135–146)
Total Bilirubin: 0.8 mg/dL (ref 0.2–1.2)
Total Protein: 7.3 g/dL (ref 6.1–8.1)
eGFR: 77 mL/min/{1.73_m2} (ref >=60)

## 2021-03-01 LAB — CBC WITH DIFFERENTIAL/PLATELET
Absolute Monocytes: 556 cells/uL (ref 200–950)
Basophils Absolute: 67 cells/uL (ref 0–200)
Basophils Relative: 1 %
Eosinophils Absolute: 127 cells/uL (ref 15–500)
Eosinophils Relative: 1.9 %
HCT: 47.3 % (ref 38.5–50.0)
Hemoglobin: 15.7 g/dL (ref 13.2–17.1)
Lymphs Abs: 2399 cells/uL (ref 850–3900)
MCH: 30.8 pg (ref 27.0–33.0)
MCHC: 33.2 g/dL (ref 32.0–36.0)
MCV: 92.9 fL (ref 80.0–100.0)
MPV: 11.9 fL (ref 7.5–12.5)
Monocytes Relative: 8.3 %
Neutro Abs: 3551 cells/uL (ref 1500–7800)
Neutrophils Relative %: 53 %
Platelets: 266 10*3/uL (ref 140–400)
RBC: 5.09 10*6/uL (ref 4.20–5.80)
RDW: 12.5 % (ref 11.0–15.0)
Total Lymphocyte: 35.8 %
WBC: 6.7 10*3/uL (ref 3.8–10.8)

## 2021-03-01 LAB — LIPID PANEL
Cholesterol: 139 mg/dL (ref <200)
HDL: 49 mg/dL (ref >=40)
LDL Cholesterol (Calc): 70 mg/dL (calc)
Non-HDL Cholesterol (Calc): 90 mg/dL (calc) (ref <130)
Total CHOL/HDL Ratio: 2.8 (calc) (ref <5.0)
Triglycerides: 114 mg/dL (ref <150)

## 2021-03-01 LAB — PSA: PSA: 0.27 ng/mL (ref <=4.00)

## 2021-03-04 ENCOUNTER — Encounter: Payer: Medicare Other | Admitting: Family Medicine

## 2021-03-04 ENCOUNTER — Other Ambulatory Visit (HOSPITAL_BASED_OUTPATIENT_CLINIC_OR_DEPARTMENT_OTHER): Payer: Self-pay

## 2021-03-04 MED ORDER — ZOSTER VAC RECOMB ADJUVANTED 50 MCG/0.5ML IM SUSR
INTRAMUSCULAR | 1 refills | Status: DC
  Filled 2021-03-04: qty 0.5, 1d supply, fill #0

## 2021-03-18 ENCOUNTER — Encounter: Payer: Medicare Other | Admitting: Family Medicine

## 2021-03-22 ENCOUNTER — Encounter: Payer: Self-pay | Admitting: Family Medicine

## 2021-03-22 ENCOUNTER — Other Ambulatory Visit: Payer: Self-pay

## 2021-03-22 ENCOUNTER — Ambulatory Visit (INDEPENDENT_AMBULATORY_CARE_PROVIDER_SITE_OTHER): Payer: Medicare Other | Admitting: Family Medicine

## 2021-03-22 VITALS — BP 118/62 | HR 62 | Resp 18 | Ht 70.0 in | Wt 349.0 lb

## 2021-03-22 DIAGNOSIS — F17200 Nicotine dependence, unspecified, uncomplicated: Secondary | ICD-10-CM

## 2021-03-22 DIAGNOSIS — E782 Mixed hyperlipidemia: Secondary | ICD-10-CM

## 2021-03-22 DIAGNOSIS — R0609 Other forms of dyspnea: Secondary | ICD-10-CM

## 2021-03-22 DIAGNOSIS — Z122 Encounter for screening for malignant neoplasm of respiratory organs: Secondary | ICD-10-CM | POA: Diagnosis not present

## 2021-03-22 DIAGNOSIS — I5189 Other ill-defined heart diseases: Secondary | ICD-10-CM

## 2021-03-22 DIAGNOSIS — Z0000 Encounter for general adult medical examination without abnormal findings: Secondary | ICD-10-CM | POA: Diagnosis not present

## 2021-03-22 MED ORDER — PANTOPRAZOLE SODIUM 40 MG PO TBEC: 40.0000 mg | DELAYED_RELEASE_TABLET | Freq: Every day | ORAL | 3 refills | Status: DC

## 2021-03-22 MED ORDER — OZEMPIC (0.25 OR 0.5 MG/DOSE) 2 MG/1.5ML ~~LOC~~ SOPN: 0.5000 mg | PEN_INJECTOR | SUBCUTANEOUS | 3 refills | Status: DC

## 2021-03-22 NOTE — Progress Notes (Signed)
Subjective:    Patient ID: James Branch, male    DOB: 11-15-1955, 65 y.o.   MRN: [id number]  HPI  Patient is a very pleasant 65 year old Caucasian gentleman here today for complete physical exam.  His last colonoscopy was in 2020 and was normal and is not due again until 2030.  He had a catheterization performed this year that showed nonobstructive coronary disease with a 20% stenosis in his LAD however it did show elevated left ventricular end-diastolic pressure consistent with diastolic dysfunction likely related to his morbid obesity.  He also has a history of obstructive sleep apnea as well as COPD.  He continues to report dyspnea on exertion which I believe is due to deconditioning, COPD, diastolic dysfunction, obesity.  He is not currently on any type of maintenance medication for his COPD.  He does report wheezing with activity.  His most recent lab work is listed below: Lab on 02/28/2021  Component Date Value Ref Range Status   WBC 02/28/2021 6.7  3.8 - 10.8 Thousand/uL Final   RBC 02/28/2021 5.09  4.20 - 5.80 Million/uL Final   Hemoglobin 02/28/2021 15.7  13.2 - 17.1 g/dL Final   HCT 02/28/2021 47.3  38.5 - 50.0 % Final   MCV 02/28/2021 92.9  80.0 - 100.0 fL Final   MCH 02/28/2021 30.8  27.0 - 33.0 pg Final   MCHC 02/28/2021 33.2  32.0 - 36.0 g/dL Final   RDW 02/28/2021 12.5  11.0 - 15.0 % Final   Platelets 02/28/2021 266  140 - 400 Thousand/uL Final   MPV 02/28/2021 11.9  7.5 - 12.5 fL Final   Neutro Abs 02/28/2021 3,551  1,500 - 7,800 cells/uL Final   Lymphs Abs 02/28/2021 2,399  850 - 3,900 cells/uL Final   Absolute Monocytes 02/28/2021 556  200 - 950 cells/uL Final   Eosinophils Absolute 02/28/2021 127  15 - 500 cells/uL Final   Basophils Absolute 02/28/2021 67  0 - 200 cells/uL Final   Neutrophils Relative % 02/28/2021 53  % Final   Total Lymphocyte 02/28/2021 35.8  % Final   Monocytes Relative 02/28/2021 8.3  % Final   Eosinophils Relative 02/28/2021 1.9  % Final    Basophils Relative 02/28/2021 1.0  % Final   Glucose, Bld 02/28/2021 110 (H)  65 - 99 mg/dL Final   Comment: .            Fasting reference interval . For someone without known diabetes, a glucose value between 100 and 125 mg/dL is consistent with prediabetes and should be confirmed with a follow-up test. .    BUN 02/28/2021 18  7 - 25 mg/dL Final   Creat 02/28/2021 1.06  0.70 - 1.35 mg/dL Final   eGFR 02/28/2021 77  > OR = 60 mL/min/1.26m Final   Comment: The eGFR is based on the CKD-EPI 2021 equation. To calculate  the new eGFR from a previous Creatinine or Cystatin C result, go to https://www.kidney.org/professionals/ kdoqi/gfr%5Fcalculator    BUN/Creatinine Ratio 140/98/1191NOT APPLICABLE  6 - 22 (calc) Final   Sodium 02/28/2021 137  135 - 146 mmol/L Final   Potassium 02/28/2021 4.6  3.5 - 5.3 mmol/L Final   Chloride 02/28/2021 104  98 - 110 mmol/L Final   CO2 02/28/2021 23  20 - 32 mmol/L Final   Calcium 02/28/2021 9.5  8.6 - 10.3 mg/dL Final   Total Protein 02/28/2021 7.3  6.1 - 8.1 g/dL Final   Albumin 02/28/2021 4.3  3.6 - 5.1 g/dL Final  Globulin 02/28/2021 3.0  1.9 - 3.7 g/dL (calc) Final   AG Ratio 02/28/2021 1.4  1.0 - 2.5 (calc) Final   Total Bilirubin 02/28/2021 0.8  0.2 - 1.2 mg/dL Final   Alkaline phosphatase (APISO) 02/28/2021 77  35 - 144 U/L Final   AST 02/28/2021 24  10 - 35 U/L Final   ALT 02/28/2021 33  9 - 46 U/L Final   Cholesterol 02/28/2021 139  <200 mg/dL Final   HDL 02/28/2021 49  > OR = 40 mg/dL Final   Triglycerides 02/28/2021 114  <150 mg/dL Final   LDL Cholesterol (Calc) 02/28/2021 70  mg/dL (calc) Final   Comment: Reference range: <100 . Desirable range <100 mg/dL for primary prevention;   <70 mg/dL for patients with CHD or diabetic patients  with > or = 2 CHD risk factors. Marland Kitchen LDL-C is now calculated using the Martin-Hopkins  calculation, which is a validated novel method providing  better accuracy than the Friedewald equation in the   estimation of LDL-C.  Cresenciano Genre et al. Annamaria Helling. 2637;858(85): 2061-2068  (http://education.[facility].com/faq/FAQ164)    Total CHOL/HDL Ratio 02/28/2021 2.8  <5.0 (calc) Final   Non-HDL Cholesterol (Calc) 02/28/2021 90  <130 mg/dL (calc) Final   Comment: For patients with diabetes plus 1 major ASCVD risk  factor, treating to a non-HDL-C goal of <100 mg/dL  (LDL-C of <70 mg/dL) is considered a therapeutic  option.    PSA 02/28/2021 0.27  < OR = 4.00 ng/mL Final   Comment: The total PSA value from this assay system is  standardized against the WHO standard. The test  result will be approximately 20% lower when compared  to the equimolar-standardized total PSA (Beckman  Coulter). Comparison of serial PSA results should be  interpreted with this fact in mind. . This test was performed using the Siemens  chemiluminescent method. Values obtained from  different assay methods cannot be used interchangeably. PSA levels, regardless of value, should not be interpreted as absolute evidence of the presence or absence of disease.   Office Visit on 02/24/2021  Component Date Value Ref Range Status   Hgb A1c MFr Bld 02/24/2021 6.0 (H)  <5.7 % of total Hgb Final   Comment: For someone without known diabetes, a hemoglobin  A1c value between 5.7% and 6.4% is consistent with prediabetes and should be confirmed with a  follow-up test. . For someone with known diabetes, a value <7% indicates that their diabetes is well controlled. A1c targets should be individualized based on duration of diabetes, age, comorbid conditions, and other considerations. . This assay result is consistent with an increased risk of diabetes. . Currently, no consensus exists regarding use of hemoglobin A1c for diagnosis of diabetes for children. .    Mean Plasma Glucose 02/24/2021 126  mg/dL Final   eAG (mmol/L) 02/24/2021 7.0  mmol/L Final   Glucose, Bld 02/24/2021 106 (H)  65 - 99 mg/dL Final   Comment:  .            Fasting reference interval . For someone without known diabetes, a glucose value between 100 and 125 mg/dL is consistent with prediabetes and should be confirmed with a follow-up test. .    BUN 02/24/2021 15  7 - 25 mg/dL Final   Creat 02/24/2021 1.07  0.70 - 1.35 mg/dL Final   eGFR 02/24/2021 76  > OR = 60 mL/min/1.57m Final   Comment: The eGFR is based on the CKD-EPI 2021 equation. To calculate  the new eGFR  from a previous Creatinine or Cystatin C result, go to https://www.kidney.org/professionals/ kdoqi/gfr%5Fcalculator    BUN/Creatinine Ratio 25/95/6387 NOT APPLICABLE  6 - 22 (calc) Final   Sodium 02/24/2021 140  135 - 146 mmol/L Final   Potassium 02/24/2021 4.1  3.5 - 5.3 mmol/L Final   Chloride 02/24/2021 100  98 - 110 mmol/L Final   CO2 02/24/2021 27  20 - 32 mmol/L Final   Calcium 02/24/2021 9.8  8.6 - 10.3 mg/dL Final   Total Protein 02/24/2021 7.2  6.1 - 8.1 g/dL Final   Albumin 02/24/2021 4.2  3.6 - 5.1 g/dL Final   Globulin 02/24/2021 3.0  1.9 - 3.7 g/dL (calc) Final   AG Ratio 02/24/2021 1.4  1.0 - 2.5 (calc) Final   Total Bilirubin 02/24/2021 0.7  0.2 - 1.2 mg/dL Final   Alkaline phosphatase (APISO) 02/24/2021 77  35 - 144 U/L Final   AST 02/24/2021 19  10 - 35 U/L Final   ALT 02/24/2021 28  9 - 46 U/L Final   WBC 02/24/2021 8.4  3.8 - 10.8 Thousand/uL Final   RBC 02/24/2021 4.93  4.20 - 5.80 Million/uL Final   Hemoglobin 02/24/2021 15.7  13.2 - 17.1 g/dL Final   HCT 02/24/2021 45.6  38.5 - 50.0 % Final   MCV 02/24/2021 92.5  80.0 - 100.0 fL Final   MCH 02/24/2021 31.8  27.0 - 33.0 pg Final   MCHC 02/24/2021 34.4  32.0 - 36.0 g/dL Final   RDW 02/24/2021 12.5  11.0 - 15.0 % Final   Platelets 02/24/2021 257  140 - 400 Thousand/uL Final   MPV 02/24/2021 12.1  7.5 - 12.5 fL Final   Neutro Abs 02/24/2021 4,586  1,500 - 7,800 cells/uL Final   Lymphs Abs 02/24/2021 3,108  850 - 3,900 cells/uL Final   Absolute Monocytes 02/24/2021 479  200 - 950  cells/uL Final   Eosinophils Absolute 02/24/2021 176  15 - 500 cells/uL Final   Basophils Absolute 02/24/2021 50  0 - 200 cells/uL Final   Neutrophils Relative % 02/24/2021 54.6  % Final   Total Lymphocyte 02/24/2021 37.0  % Final   Monocytes Relative 02/24/2021 5.7  % Final   Eosinophils Relative 02/24/2021 2.1  % Final   Basophils Relative 02/24/2021 0.6  % Final   I recommended that the patient take Ozempic to try to facilitate weight loss and because of his prediabetes.  However he was never contacted our office so he has not started medication.  Otherwise his labs are excellent.  His immunizations are up-to-date: Immunization History  Administered Date(s) Administered   Fluad Quad(high Dose 65+) 01/26/2021   Influenza,inj,Quad PF,6+ Mos 03/14/2017, 03/04/2018, 01/31/2019, 02/05/2020   Influenza-Unspecified 02/28/2016, 02/24/2017   PFIZER(Purple Top)SARS-COV-2 Vaccination 05/23/2019, 06/17/2019, 01/22/2020, 10/29/2020   PNEUMOCOCCAL CONJUGATE-20 01/26/2021   Pfizer Covid-19 Vaccine Bivalent Booster 78yr & up 02/10/2021   Pneumococcal Polysaccharide-23 01/31/2019   Tdap 10/26/2014   Zoster Recombinat (Shingrix) 03/04/2021   Zoster, Live 03/28/2016     Past Medical History:  Diagnosis Date   Arthritis    knees   Asthma    CAD (coronary artery disease)    COPD (chronic obstructive pulmonary disease) (HCC)    Diastolic dysfunction    GERD (gastroesophageal reflux disease)    Hyperlipidemia    Hypertension    Obesity    OSA on CPAP    ahi-84, 18 CWP   Sleep apnea    wears cpap    Smoker    Umbilical hernia  Past Surgical History:  Procedure Laterality Date   CARDIAC CATHETERIZATION     COLONOSCOPY  2007   INSERTION OF MESH N/A 12/16/2020   Procedure: INSERTION OF MESH;  Surgeon: Coralie Keens, MD;  Location: [facility];  Service: General;  Laterality: N/A;   LEFT HEART CATH AND CORONARY ANGIOGRAPHY N/A 10/12/2020   Procedure: LEFT HEART CATH AND CORONARY ANGIOGRAPHY;   Surgeon: [name], MD;  Location: [facility];  Service: Cardiovascular;  Laterality: N/A;   TONSILLECTOMY  5726   UMBILICAL HERNIA REPAIR N/A 12/16/2020   Procedure: UMBILICAL HERNIA REPAIR WITH MESH;  Surgeon: Coralie Keens, MD;  Location: [facility];  Service: General;  Laterality: N/A;  LMA   Current Outpatient Medications on File Prior to Visit  Medication Sig Dispense Refill   albuterol (VENTOLIN HFA) 108 (90 Base) MCG/ACT inhaler INHALE 2 PUFFS INTO THE LUNGS 4 (FOUR) TIMES DAILY. 8.5 each 11   atorvastatin (LIPITOR) 20 MG tablet TAKE ONE TABLET BY MOUTH ONCE DAILY 90 tablet 3   betamethasone dipropionate 0.05 % cream Apply 1 application topically 2 (two) times daily as needed (irritation). 45 g 3   furosemide (LASIX) 40 MG tablet TAKE ONE TABLET BY MOUTH ONCE DAILY 90 tablet 3   lisinopril (ZESTRIL) 20 MG tablet Take 1 tablet by mouth daily.     metoprolol tartrate (LOPRESSOR) 25 MG tablet Take 25 mg by mouth 2 (two) times daily.     nitroGLYCERIN (NITROSTAT) 0.4 MG SL tablet Place 0.4 mg under the tongue every 5 (five) minutes as needed for chest pain.     Omega-3 Fatty Acids (FISH OIL) 1000 MG CAPS Take 1,000 mg by mouth 3 (three) times a week.     oxyCODONE (OXY IR/ROXICODONE) 5 MG immediate release tablet Take 1 tablet (5 mg total) by mouth every 6 (six) hours as needed for moderate pain or severe pain. 25 tablet 0   sodium chloride (OCEAN) 0.65 % SOLN nasal spray Place 1 spray into both nostrils as needed for congestion. 88 mL 0   No current facility-administered medications on file prior to visit.   Allergies  Allergen Reactions   Penicillins Swelling   Social History   Socioeconomic History   Marital status: Married    Spouse name: [name]   Number of children: 4   Years of education: Not on file   Highest education level: Not on file  Occupational History   Occupation: Retired  Tobacco Use   Smoking status: Former    Packs/day: 0.50    Years: 20.00     Pack years: 10.00    Types: Cigarettes    Quit date: 02/17/2019    Years since quitting: 2.0   Smokeless tobacco: Never  Vaping Use   Vaping Use: Never used  Substance and Sexual Activity   Alcohol use: No    Alcohol/week: 0.0 standard drinks   Drug use: No   Sexual activity: Yes    Birth control/protection: Condom  Other Topics Concern   Not on file  Social History Narrative   4 children, 18 grandchildren.   Social Determinants of Health   Financial Resource Strain: Low Risk    Difficulty of Paying Living Expenses: Not hard at all  Food Insecurity: No Food Insecurity   Worried About Charity fundraiser in the Last Year: Never true   [place] in the Last Year: Never true  Transportation Needs: No Transportation Needs   Lack of Transportation (Medical): No  Lack of Transportation (Non-Medical): No  Physical Activity: Insufficiently Active   Days of Exercise per Week: 5 days   Minutes of Exercise per Session: 10 min  Stress: No Stress Concern Present   Feeling of Stress : Not at all  Social Connections: Moderately Isolated   Frequency of Communication with Friends and Family: More than three times a week   Frequency of Social Gatherings with Friends and Family: More than three times a week   Attends Religious Services: Never   Marine scientist or Organizations: No   Attends Music therapist: Never   Marital Status: Married  Human resources officer Violence: Not At Risk   Fear of Current or Ex-Partner: No   Emotionally Abused: No   Physically Abused: No   Sexually Abused: No      Review of Systems  All other systems reviewed and are negative.     Objective:   Physical Exam Vitals reviewed.  Constitutional:      General: He is not in acute distress.    Appearance: He is well-developed. He is not diaphoretic.  HENT:     Head: Normocephalic and atraumatic.     Right Ear: External ear normal.     Left Ear: External ear normal.     Nose:  Nose normal.     Mouth/Throat:     Pharynx: No oropharyngeal exudate.  Eyes:     General: No scleral icterus.       Right eye: No discharge.        Left eye: No discharge.     Conjunctiva/sclera: Conjunctivae normal.     Pupils: Pupils are equal, round, and reactive to light.  Neck:     Thyroid: No thyromegaly.     Vascular: No JVD.     Trachea: No tracheal deviation.  Cardiovascular:     Rate and Rhythm: Normal rate and regular rhythm.     Heart sounds: Normal heart sounds. No murmur heard.   No friction rub. No gallop.  Pulmonary:     Effort: Pulmonary effort is normal. No respiratory distress.     Breath sounds: Normal breath sounds. No stridor. No wheezing or rales.  Chest:     Chest wall: No tenderness.  Abdominal:     General: Bowel sounds are normal. There is no distension.     Palpations: Abdomen is soft. There is no mass.     Tenderness: There is no abdominal tenderness. There is no guarding or rebound.  Musculoskeletal:        General: No tenderness or deformity.     Cervical back: Normal range of motion and neck supple.     Right knee: Decreased range of motion.     Left knee: Decreased range of motion.  Lymphadenopathy:     Cervical: No cervical adenopathy.  Skin:    General: Skin is warm.     Coloration: Skin is not pale.     Findings: No erythema or rash.  Neurological:     Mental Status: He is alert and oriented to person, place, and time.     Cranial Nerves: No cranial nerve deficit.     Deep Tendon Reflexes: Reflexes are normal and symmetric. Reflexes normal.  Psychiatric:        Behavior: Behavior normal.        Thought Content: Thought content normal.        Judgment: Judgment normal.          Assessment & Plan:  Encounter for Medicare annual wellness exam  Morbid obesity ([place])  Smoker  Screening for lung cancer  Diastolic dysfunction  Exertional dyspnea  Mixed hyperlipidemia Colonoscopy and prostate cancer screening are up-to-date  but I will schedule the patient for CT scan of the lung to screen for lung cancer.  I congratulated the patient on being 1 year abstinent from tobacco.  Blood pressure today is acceptable.  He does have prediabetes and with his BMI of 50 I recommended starting Ozempic 0.5 mg subcu weekly to try to facilitate weight loss.  Recheck in 1 month and if tolerating the medication I would increase the dose to 1 mg.  Also encouraged exercise 30 minutes a day 5 days a week and dietary changes to achieve weight loss.  Cholesterol and the remainder of his lab work are normal.  Immunizations are up-to-date as is cancer screening.  Due to his dyspnea on exertion, I will start the patient on Breztri 2 inhalations twice daily to see if this helps.  Reassess in 1 month.  I gave him samples of this to try.

## 2021-03-30 DIAGNOSIS — G4733 Obstructive sleep apnea (adult) (pediatric): Secondary | ICD-10-CM | POA: Diagnosis not present

## 2021-04-19 ENCOUNTER — Other Ambulatory Visit: Payer: Self-pay | Admitting: Family Medicine

## 2021-04-19 ENCOUNTER — Ambulatory Visit
Admission: RE | Admit: 2021-04-19 | Discharge: 2021-04-19 | Disposition: A | Discharge status: Home / Self Care | Payer: Medicare Other | Source: Ambulatory Visit | Attending: Family Medicine | Admitting: Family Medicine

## 2021-04-19 ENCOUNTER — Encounter: Payer: Self-pay | Admitting: Family Medicine

## 2021-04-19 DIAGNOSIS — I700 Atherosclerosis of aorta: Secondary | ICD-10-CM | POA: Diagnosis not present

## 2021-04-19 DIAGNOSIS — Z122 Encounter for screening for malignant neoplasm of respiratory organs: Secondary | ICD-10-CM

## 2021-04-19 DIAGNOSIS — Z87891 Personal history of nicotine dependence: Secondary | ICD-10-CM | POA: Diagnosis not present

## 2021-04-19 DIAGNOSIS — I2510 Atherosclerotic heart disease of native coronary artery without angina pectoris: Secondary | ICD-10-CM | POA: Diagnosis not present

## 2021-04-19 DIAGNOSIS — J439 Emphysema, unspecified: Secondary | ICD-10-CM | POA: Diagnosis not present

## 2021-04-19 MED ORDER — BREZTRI AEROSPHERE 160-9-4.8 MCG/ACT IN AERO: 2.0000 | INHALATION_SPRAY | Freq: Two times a day (BID) | RESPIRATORY_TRACT | 11 refills | Status: DC

## 2021-04-19 MED ORDER — SEMAGLUTIDE (1 MG/DOSE) 4 MG/3ML ~~LOC~~ SOPN: 1.0000 mg | PEN_INJECTOR | SUBCUTANEOUS | 2 refills | Status: DC

## 2021-05-04 ENCOUNTER — Telehealth: Payer: Self-pay

## 2021-05-04 NOTE — Telephone Encounter (Signed)
[employer] Representative called in about 2 appts this pt had that were coded the same. One of these appts was an actual AWV on 01/13/21 with the nurse advisor and the other was complete annual physical with pt's pcp on 03/22/21. [employer] is asking that the cpt code for the complete physical  exam be resubmitted under the correct code for that exam. Please advise. °

## 2021-05-05 ENCOUNTER — Telehealth: Payer: Self-pay | Admitting: Family Medicine

## 2021-05-05 ENCOUNTER — Telehealth (INDEPENDENT_AMBULATORY_CARE_PROVIDER_SITE_OTHER): Payer: Medicare Other | Admitting: Nurse Practitioner

## 2021-05-05 ENCOUNTER — Encounter: Payer: Self-pay | Admitting: Nurse Practitioner

## 2021-05-05 ENCOUNTER — Other Ambulatory Visit: Payer: Self-pay

## 2021-05-05 VITALS — BP 138/78 | HR 77 | Ht 70.0 in | Wt 336.0 lb

## 2021-05-05 DIAGNOSIS — J029 Acute pharyngitis, unspecified: Secondary | ICD-10-CM

## 2021-05-05 NOTE — Telephone Encounter (Signed)
Patient's bill for December 2022 needs to be resubmitted with correct code; CPE and AWV appointment dates were very close; same cpt code used for both. Requesting for bills to be corrected and resubmitted.  Please advise at [phone].

## 2021-05-05 NOTE — Progress Notes (Signed)
Subjective:    Patient ID: James Branch, male    DOB: Apr 14, 1956, 66 y.o.   MRN: [id number]  HPI: James Branch is a 66 y.o. male presenting for  Chief Complaint  Patient presents with   Sore Throat   UPPER RESPIRATORY TRACT INFECTION Onset: 2 days ago Fever: no Cough: no Shortness of breath: no Wheezing: no Chest pain: no Chest tightness: no Chest congestion: no Nasal congestion: no Runny nose: no Post nasal drip: no Sneezing: no Sore throat: yes; patient thinks he saw white patches today when he took a picture with his camera on his phone Swollen glands: no Sinus pressure: no Headache: no Face pain: no Toothache: no Ear pain: no  Ear pressure: no  Eyes red/itching:no Eye drainage/crusting: no  Nausea: no  Vomiting: no Diarrhea: no  Change in appetite: no  Loss of taste/smell: no  Rash: no Fatigue: no Sick contacts: no Strep contacts: no  Context: stable Recurrent sinusitis: no Treatments attempted: nothing Relief with OTC medications: n/a  Allergies  Allergen Reactions   Penicillins Swelling    Outpatient Encounter Medications as of 05/05/2021  Medication Sig Note   albuterol (VENTOLIN HFA) 108 (90 Base) MCG/ACT inhaler INHALE 2 PUFFS INTO THE LUNGS 4 (FOUR) TIMES DAILY.    atorvastatin (LIPITOR) 20 MG tablet TAKE ONE TABLET BY MOUTH ONCE DAILY    betamethasone dipropionate 0.05 % cream Apply 1 application topically 2 (two) times daily as needed (irritation).    Budeson-Glycopyrrol-Formoterol (BREZTRI AEROSPHERE) 160-9-4.8 MCG/ACT AERO Inhale 2 puffs into the lungs 2 (two) times daily.    furosemide (LASIX) 40 MG tablet TAKE ONE TABLET BY MOUTH ONCE DAILY    lisinopril (ZESTRIL) 20 MG tablet Take 1 tablet by mouth daily.    metoprolol tartrate (LOPRESSOR) 25 MG tablet Take 25 mg by mouth 2 (two) times daily.    nitroGLYCERIN (NITROSTAT) 0.4 MG SL tablet Place 0.4 mg under the tongue every 5 (five) minutes as needed for chest pain. 12/16/2020:  Never used   Omega-3 Fatty Acids (FISH OIL) 1000 MG CAPS Take 1,000 mg by mouth 3 (three) times a week.    pantoprazole (PROTONIX) 40 MG tablet Take 1 tablet (40 mg total) by mouth daily.    Semaglutide, 1 MG/DOSE, 4 MG/3ML SOPN Inject 1 mg as directed once a week.    sodium chloride (OCEAN) 0.65 % SOLN nasal spray Place 1 spray into both nostrils as needed for congestion.    [DISCONTINUED] oxyCODONE (OXY IR/ROXICODONE) 5 MG immediate release tablet Take 1 tablet (5 mg total) by mouth every 6 (six) hours as needed for moderate pain or severe pain.    No facility-administered encounter medications on file as of 05/05/2021.    Patient Active Problem List   Diagnosis Date Noted   Abnormal stress test 07/05/2020   Upper respiratory tract infection 04/02/2020   Exertional dyspnea 09/05/2019   Mixed hyperlipidemia 02/20/2019   Coronary artery calcification    Bilateral sensorineural hearing loss 01/15/2017   Morbid obesity ([place])    Smoker    OSA on CPAP    Diastolic dysfunction     Past Medical History:  Diagnosis Date   Arthritis    knees   Asthma    CAD (coronary artery disease)    COPD (chronic obstructive pulmonary disease) (HCC)    Diastolic dysfunction    GERD (gastroesophageal reflux disease)    Hyperlipidemia    Hypertension    Obesity    OSA on CPAP  ahi-84, 18 CWP   Sleep apnea    wears cpap    Smoker    Umbilical hernia     Relevant past medical, surgical, family and social history reviewed and updated as indicated. Interim medical history since our last visit reviewed.  Review of Systems Per HPI unless specifically indicated above     Objective:    BP 138/78    Pulse 77    Ht 5\' 10"  (1.778 m)    Wt (!) 336 lb (152.4 kg)    SpO2 95%    BMI 48.21 kg/m   Wt Readings from Last 3 Encounters:  05/05/21 (!) 336 lb (152.4 kg)  03/22/21 (!) 349 lb (158.3 kg)  02/24/21 (!) 348 lb (157.9 kg)    Physical Exam Vitals and nursing note reviewed.  Constitutional:       Appearance: He is well-developed. He is obese.  HENT:     Right Ear: Tympanic membrane and ear canal normal. No drainage, swelling or tenderness. No middle ear effusion. Tympanic membrane is not erythematous.     Left Ear: Tympanic membrane and ear canal normal. No drainage, swelling or tenderness.  No middle ear effusion. Tympanic membrane is not erythematous.     Mouth/Throat:     Mouth: Mucous membranes are moist.     Pharynx: Uvula midline. Posterior oropharyngeal erythema present. No pharyngeal swelling, oropharyngeal exudate or uvula swelling.     Tonsils: No tonsillar exudate or tonsillar abscesses.  Cardiovascular:     Rate and Rhythm: Normal rate and regular rhythm.  Pulmonary:     Effort: Pulmonary effort is normal. No respiratory distress.     Breath sounds: Normal breath sounds. No wheezing, rhonchi or rales.  Skin:    General: Skin is warm and dry.     Capillary Refill: Capillary refill takes less than 2 seconds.     Findings: No erythema or rash.  Neurological:     Mental Status: He is alert and oriented to person, place, and time.      Assessment & Plan:  1. Sore throat Acute. Suspect viral etiology.  Reviewed pictures on phone with patient - small white papules appreciated to right buccal mucosa and right soft palate.  No white patches or plaques on examination today.  Will obtain strep testing and treat as indicated.  Discussed symptomatic treatment with warm liquids with lemon/honey and throat lozenges, gargling with salt water to help with sore throat.  Return to clinic if symptoms do not improved after 1 week or if they worsen.  - STREP GROUP A AG, W/REFLEX TO CULT    Follow up plan: Return if symptoms worsen or fail to improve.

## 2021-05-07 LAB — STREP GROUP A AG, W/REFLEX TO CULT: Streptococcus Group A AG: NOT DETECTED

## 2021-05-07 LAB — CULTURE, GROUP A STREP
MICRO NUMBER:: 12892237
SPECIMEN QUALITY:: ADEQUATE

## 2021-05-23 ENCOUNTER — Encounter: Payer: Self-pay | Admitting: Family Medicine

## 2021-05-24 ENCOUNTER — Telehealth: Payer: Self-pay | Admitting: Pharmacist

## 2021-05-24 NOTE — Progress Notes (Addendum)
Chronic Care Management Pharmacy Assistant   Name: James Branch  MRN: [id number] DOB: 03-14-1956   Reason for Encounter: Monthly Medication Coordination Call    Medications: Outpatient Encounter Medications as of 05/24/2021  Medication Sig Note   albuterol (VENTOLIN HFA) 108 (90 Base) MCG/ACT inhaler INHALE 2 PUFFS INTO THE LUNGS 4 (FOUR) TIMES DAILY.    atorvastatin (LIPITOR) 20 MG tablet TAKE ONE TABLET BY MOUTH ONCE DAILY    betamethasone dipropionate 0.05 % cream Apply 1 application topically 2 (two) times daily as needed (irritation).    Budeson-Glycopyrrol-Formoterol (BREZTRI AEROSPHERE) 160-9-4.8 MCG/ACT AERO Inhale 2 puffs into the lungs 2 (two) times daily.    furosemide (LASIX) 40 MG tablet TAKE ONE TABLET BY MOUTH ONCE DAILY    lisinopril (ZESTRIL) 20 MG tablet Take 1 tablet by mouth daily.    metoprolol tartrate (LOPRESSOR) 25 MG tablet Take 25 mg by mouth 2 (two) times daily.    nitroGLYCERIN (NITROSTAT) 0.4 MG SL tablet Place 0.4 mg under the tongue every 5 (five) minutes as needed for chest pain. 12/16/2020: Never used   Omega-3 Fatty Acids (FISH OIL) 1000 MG CAPS Take 1,000 mg by mouth 3 (three) times a week.    pantoprazole (PROTONIX) 40 MG tablet Take 1 tablet (40 mg total) by mouth daily.    Semaglutide, 1 MG/DOSE, 4 MG/3ML SOPN Inject 1 mg as directed once a week.    sodium chloride (OCEAN) 0.65 % SOLN nasal spray Place 1 spray into both nostrils as needed for congestion.    No facility-administered encounter medications on file as of 05/24/2021.    Reviewed chart for medication changes ahead of medication coordination call.  No OVs, Consults, or hospital visits since last care coordination call/Pharmacist visit. (If appropriate, list visit date, provider name)  No medication changes indicated OR if recent visit, treatment plan here.  BP Readings from Last 3 Encounters:  05/05/21 138/78  03/22/21 118/62  02/24/21 138/68    Lab Results  Component Value Date    HGBA1C 6.0 (H) 02/24/2021     Patient obtains medications through Vials  90 Days   Last adherence delivery included: (medication name and frequency)  Albuterol Sulfate 90 mcg daily Atorvastatin 20 mg daily Furosemide 40 mg daily Metoprolol tartrate 25 mg twice daily  Lisinopril 20 mg daily  Patient declined (meds) last month due to PRN use/additional supply on hand. Betamethasone Diproprionate Cr 0.05% (PRN) Nitroglycerin 0.4mg  SL (PRN   Patient is due for next adherence delivery on: 06/06/21. Called patient and reviewed medications and coordinated delivery.   This delivery to include:  Albuterol Sulfate 90 mcg daily Atorvastatin 20 mg daily Furosemide 40 mg daily Metoprolol tartrate 25 mg twice daily  Lisinopril 20 mg daily Breztri Inhaler 2 puffs twice a day    Patient declined the following medications (meds) due to (reason) Albuterol Inhaler - pt is not currently using  Patient needs refills for None.  Confirmed delivery date of 06/06/21, advised patient that pharmacy will contact them the morning of delivery.   Care Gaps  AWV: done 03/22/21 Colonoscopy: done 03/05/19 DM Eye Exam: N/A DM Foot Exam: N/A Microalbumin: never HbgAIC: done 02/24/21 (6.0) DEXA: N/A Mammogram: N/A  Star Rating Drugs: Lisinopril (ZESTRIL) 20 MG tablet - last filled 02/28/21 90 days  Atorvastatin (LIPITOR) 20 MG tablet - last filled 02/28/21 90 days  Semaglutide, 1 MG/DOSE, 4 MG/3ML SOPN - last filled 05/23/21 28 days   Future Appointments  Date Time Provider [place]  02/10/2022  9:00 AM [facility]-NURSE HEALTH ADVISOR [facility] None    Jobe Gibbon, CCMA Clinical Pharmacist Assistant  [phone]  10 minutes spent in review, coordination, and documentation.  Reviewed by: Beverly Milch, PharmD Clinical Pharmacist [phone]

## 2021-05-30 DIAGNOSIS — J449 Chronic obstructive pulmonary disease, unspecified: Secondary | ICD-10-CM | POA: Diagnosis not present

## 2021-05-30 DIAGNOSIS — I10 Essential (primary) hypertension: Secondary | ICD-10-CM | POA: Diagnosis not present

## 2021-05-30 DIAGNOSIS — R9439 Abnormal result of other cardiovascular function study: Secondary | ICD-10-CM | POA: Diagnosis not present

## 2021-05-30 DIAGNOSIS — N471 Phimosis: Secondary | ICD-10-CM | POA: Diagnosis not present

## 2021-05-30 DIAGNOSIS — Z87891 Personal history of nicotine dependence: Secondary | ICD-10-CM | POA: Diagnosis not present

## 2021-05-30 DIAGNOSIS — E785 Hyperlipidemia, unspecified: Secondary | ICD-10-CM | POA: Diagnosis not present

## 2021-05-30 DIAGNOSIS — I872 Venous insufficiency (chronic) (peripheral): Secondary | ICD-10-CM | POA: Diagnosis not present

## 2021-05-30 DIAGNOSIS — L905 Scar conditions and fibrosis of skin: Secondary | ICD-10-CM | POA: Diagnosis not present

## 2021-05-30 DIAGNOSIS — I2510 Atherosclerotic heart disease of native coronary artery without angina pectoris: Secondary | ICD-10-CM | POA: Diagnosis not present

## 2021-05-30 DIAGNOSIS — G4733 Obstructive sleep apnea (adult) (pediatric): Secondary | ICD-10-CM | POA: Diagnosis not present

## 2021-06-01 DIAGNOSIS — N4883 Acquired buried penis: Secondary | ICD-10-CM | POA: Insufficient documentation

## 2021-06-10 ENCOUNTER — Telehealth: Payer: Self-pay | Admitting: Family Medicine

## 2021-06-10 ENCOUNTER — Other Ambulatory Visit (HOSPITAL_BASED_OUTPATIENT_CLINIC_OR_DEPARTMENT_OTHER): Payer: Self-pay

## 2021-06-10 MED ORDER — ZOSTER VAC RECOMB ADJUVANTED 50 MCG/0.5ML IM SUSR
INTRAMUSCULAR | 0 refills | Status: DC
  Filled 2021-06-10: qty 0.5, 1d supply, fill #0

## 2021-06-10 NOTE — Telephone Encounter (Signed)
Patient's spouse Dory Larsen called to follow up on recent cpe and awv visits. Patient received bill from insurance stating same cpt code was used for both visits. Requesting for error to be corrected and for bills to be resubmitted.   Patient has [employer], [place] #[phone].  Please advise at [phone].

## 2021-06-11 ENCOUNTER — Telehealth: Payer: Medicare Other | Admitting: Nurse Practitioner

## 2021-06-11 DIAGNOSIS — R197 Diarrhea, unspecified: Secondary | ICD-10-CM

## 2021-06-11 NOTE — Progress Notes (Signed)
James Branch,  The diarrhea that we treat is related to a bacterial/viral illness or from food poisoning. It does not sound like your diarrhea is from any of those, and is most likely a side effect of your medication.   Aside from the things you are doing there is not much more we can recommend aside from stopping the medication until you can see your doctor next week.   Bulking up your stool with starches (toast, potatoes, plan pasta, bananas, apples) and avoiding any spicy/greasy and fried foods will help. You can also continue Imodium.   If symptoms like dehydration, or fever onset you should be seen over the weekend.   Based on what you shared with me, I feel your condition warrants further evaluation and I recommend that you be seen for a face to face visit.  Please contact your primary care physician practice to be seen. Many offices offer virtual options to be seen via video if you are not comfortable going in person to a medical facility at this time.  NOTE: You will NOT be charged for this eVisit.   If you are having a true medical emergency please call 911.   Your e-visit answers were reviewed by a board certified advanced clinical practitioner to complete your personal care plan.  Thank you for using e-Visits.

## 2021-06-13 ENCOUNTER — Other Ambulatory Visit: Payer: Self-pay

## 2021-06-13 ENCOUNTER — Encounter: Payer: Self-pay | Admitting: Family Medicine

## 2021-06-13 ENCOUNTER — Other Ambulatory Visit (HOSPITAL_BASED_OUTPATIENT_CLINIC_OR_DEPARTMENT_OTHER): Payer: Self-pay

## 2021-06-13 ENCOUNTER — Ambulatory Visit (INDEPENDENT_AMBULATORY_CARE_PROVIDER_SITE_OTHER): Payer: Medicare Other | Admitting: Family Medicine

## 2021-06-13 VITALS — BP 128/78 | HR 68 | Temp 97.0°F | Resp 18 | Ht 70.0 in | Wt 329.0 lb

## 2021-06-13 DIAGNOSIS — R197 Diarrhea, unspecified: Secondary | ICD-10-CM

## 2021-06-13 MED ORDER — DIPHENOXYLATE-ATROPINE 2.5-0.025 MG PO TABS: 2.0000 | ORAL_TABLET | Freq: Four times a day (QID) | ORAL | 0 refills | Status: DC | PRN

## 2021-06-13 NOTE — Progress Notes (Signed)
Subjective:    Patient ID: James Branch, male    DOB: 12/13/1955, 66 y.o.   MRN: [id number]  Patient is currently on Ozempic for weight loss.  He has had success and is lost about 20 pounds.  However he also reports intermittent diarrhea.  He states that he has severe diarrhea for few days and then he will be fine for a few days.  He denies any abdominal pain.  He denies any fevers or chills.  He denies any hematochezia or melena.  He denies any nausea or vomiting.  He denies any bloody stool or rashes or joint pain or myalgias. Past Medical History:  Diagnosis Date   Arthritis    knees   Asthma    CAD (coronary artery disease)    COPD (chronic obstructive pulmonary disease) (HCC)    Diastolic dysfunction    GERD (gastroesophageal reflux disease)    Hyperlipidemia    Hypertension    Obesity    OSA on CPAP    ahi-84, 18 CWP   Sleep apnea    wears cpap    Smoker    Umbilical hernia    Past Surgical History:  Procedure Laterality Date   CARDIAC CATHETERIZATION     COLONOSCOPY  2007   INSERTION OF MESH N/A 12/16/2020   Procedure: INSERTION OF MESH;  Surgeon: Coralie Keens, MD;  Location: [place];  Service: General;  Laterality: N/A;   LEFT HEART CATH AND CORONARY ANGIOGRAPHY N/A 10/12/2020   Procedure: LEFT HEART CATH AND CORONARY ANGIOGRAPHY;  Surgeon: [name], MD;  Location: [facility];  Service: Cardiovascular;  Laterality: N/A;   TONSILLECTOMY  [id number]   UMBILICAL HERNIA REPAIR N/A 12/16/2020   Procedure: UMBILICAL HERNIA REPAIR WITH MESH;  Surgeon: Coralie Keens, MD;  Location: [place];  Service: General;  Laterality: N/A;  LMA   Current Outpatient Medications on File Prior to Visit  Medication Sig Dispense Refill   atorvastatin (LIPITOR) 20 MG tablet TAKE ONE TABLET BY MOUTH ONCE DAILY 90 tablet 3   betamethasone dipropionate 0.05 % cream Apply 1 application topically 2 (two) times daily as needed (irritation). 45 g 3   Budeson-Glycopyrrol-Formoterol  (BREZTRI AEROSPHERE) 160-9-4.8 MCG/ACT AERO Inhale 2 puffs into the lungs 2 (two) times daily. 10.7 g 11   furosemide (LASIX) 40 MG tablet TAKE ONE TABLET BY MOUTH ONCE DAILY 90 tablet 3   lisinopril (ZESTRIL) 20 MG tablet Take 1 tablet by mouth daily.     metoprolol tartrate (LOPRESSOR) 25 MG tablet Take 25 mg by mouth 2 (two) times daily.     nitroGLYCERIN (NITROSTAT) 0.4 MG SL tablet Place 0.4 mg under the tongue every 5 (five) minutes as needed for chest pain.     pantoprazole (PROTONIX) 40 MG tablet Take 1 tablet (40 mg total) by mouth daily. 90 tablet 3   Semaglutide, 1 MG/DOSE, 4 MG/3ML SOPN Inject 1 mg as directed once a week. 3 mL 2   No current facility-administered medications on file prior to visit.   Allergies  Allergen Reactions   Penicillins Swelling   Social History   Socioeconomic History   Marital status: Married    Spouse name: [name]   Number of children: 4   Years of education: Not on file   Highest education level: Not on file  Occupational History   Occupation: Retired  Tobacco Use   Smoking status: Former    Packs/day: 0.50    Years: 20.00    Pack years:  10.00    Types: Cigarettes    Quit date: 02/17/2019    Years since quitting: 2.3   Smokeless tobacco: Never  Vaping Use   Vaping Use: Never used  Substance and Sexual Activity   Alcohol use: No    Alcohol/week: 0.0 standard drinks   Drug use: No   Sexual activity: Yes    Birth control/protection: Condom  Other Topics Concern   Not on file  Social History Narrative   4 children, 18 grandchildren.   Social Determinants of Health   Financial Resource Strain: Low Risk    Difficulty of Paying Living Expenses: Not hard at all  Food Insecurity: No Food Insecurity   Worried About Charity fundraiser in the Last Year: Never true   [place] in the Last Year: Never true  Transportation Needs: No Transportation Needs   Lack of Transportation (Medical): No   Lack of Transportation (Non-Medical):  No  Physical Activity: Insufficiently Active   Days of Exercise per Week: 5 days   Minutes of Exercise per Session: 10 min  Stress: No Stress Concern Present   Feeling of Stress : Not at all  Social Connections: Moderately Isolated   Frequency of Communication with Friends and Family: More than three times a week   Frequency of Social Gatherings with Friends and Family: More than three times a week   Attends Religious Services: Never   Marine scientist or Organizations: No   Attends Music therapist: Never   Marital Status: Married  Human resources officer Violence: Not At Risk   Fear of Current or Ex-Partner: No   Emotionally Abused: No   Physically Abused: No   Sexually Abused: No      Review of Systems  All other systems reviewed and are negative.     Objective:   Physical Exam Vitals reviewed.  HENT:     Right Ear: External ear normal.     Left Ear: External ear normal.     Nose: Nose normal.     Mouth/Throat:     Pharynx: No oropharyngeal exudate.  Cardiovascular:     Rate and Rhythm: Normal rate and regular rhythm.     Heart sounds: Normal heart sounds.  Pulmonary:     Effort: No accessory muscle usage or respiratory distress.     Breath sounds: Decreased breath sounds and wheezing present.          Assessment & Plan:  Diarrhea, unspecified type Patient has had subacute diarrhea now for more than 5 weeks.  I suspect that this is likely a side effect of the Ozempic.  I recommended that he temporarily discontinue Ozempic and try Lomotil 2 tablets twice a day and see if the diarrhea improves over the next 2 weeks.  Recheck if no better after 2 weeks or sooner if worsening

## 2021-06-14 NOTE — Telephone Encounter (Signed)
Sending to billing dept for clarification

## 2021-06-19 ENCOUNTER — Encounter: Payer: Self-pay | Admitting: Family Medicine

## 2021-06-22 ENCOUNTER — Encounter: Payer: Self-pay | Admitting: Cardiology

## 2021-06-22 ENCOUNTER — Other Ambulatory Visit: Payer: Self-pay

## 2021-06-22 ENCOUNTER — Ambulatory Visit: Payer: Medicare Other | Admitting: Cardiology

## 2021-06-22 ENCOUNTER — Other Ambulatory Visit: Payer: Self-pay | Admitting: Family Medicine

## 2021-06-22 VITALS — BP 139/77 | HR 65 | Temp 97.7°F | Resp 16 | Ht 70.0 in | Wt 330.0 lb

## 2021-06-22 DIAGNOSIS — E782 Mixed hyperlipidemia: Secondary | ICD-10-CM

## 2021-06-22 DIAGNOSIS — R0609 Other forms of dyspnea: Secondary | ICD-10-CM

## 2021-06-22 DIAGNOSIS — I10 Essential (primary) hypertension: Secondary | ICD-10-CM

## 2021-06-22 MED ORDER — DIPHENOXYLATE-ATROPINE 2.5-0.025 MG PO TABS: 2.0000 | ORAL_TABLET | Freq: Four times a day (QID) | ORAL | 0 refills | Status: DC | PRN

## 2021-06-22 NOTE — Progress Notes (Signed)
? ? ?Patient referred by James Frizzle, MD for coronary artery calcification. ? ?Subjective:  ? ?James Branch, male    DOB: 07-17-1955, 66 y.o.   MRN: [id number] ? ? ?Chief Complaint  ?Patient presents with  ? Shortness of Breath  ? Follow-up  ?  6 months  ? ? ?HPI ? ?66 y.o. Caucasian male with controlled hypertension, hyperlipidemia, tobacco dependence, CAD with exertional dyspnea, abnormal stress test, morbid obesity, venous insufficiency ? ?Patient is doing well. He has intentionally lost 20 lbs in last 3 months. He is hoping to undergo urological surgery in the near future under general anesthesia.  ? ? ?Current Outpatient Medications:  ?  atorvastatin (LIPITOR) 20 MG tablet, TAKE ONE TABLET BY MOUTH ONCE DAILY, Disp: 90 tablet, Rfl: 3 ?  Budeson-Glycopyrrol-Formoterol (BREZTRI AEROSPHERE) 160-9-4.8 MCG/ACT AERO, Inhale 2 puffs into the lungs 2 (two) times daily., Disp: 10.7 g, Rfl: 11 ?  furosemide (LASIX) 40 MG tablet, TAKE ONE TABLET BY MOUTH ONCE DAILY, Disp: 90 tablet, Rfl: 3 ?  lisinopril (ZESTRIL) 20 MG tablet, Take 1 tablet by mouth daily., Disp: , Rfl:  ?  metoprolol tartrate (LOPRESSOR) 25 MG tablet, Take 25 mg by mouth 2 (two) times daily., Disp: , Rfl:  ?  nitroGLYCERIN (NITROSTAT) 0.4 MG SL tablet, Place 0.4 mg under the tongue every 5 (five) minutes as needed for chest pain., Disp: , Rfl:  ?  pantoprazole (PROTONIX) 40 MG tablet, Take 1 tablet (40 mg total) by mouth daily., Disp: 90 tablet, Rfl: 3 ?  Semaglutide, 1 MG/DOSE, 4 MG/3ML SOPN, Inject 1 mg as directed once a week., Disp: 3 mL, Rfl: 2 ? ? ? ?Cardiovascular studies: ? ?EKG 06/22/2021: ?Sinus rhythm 62 bpm  ?Low voltage in limb leads ? ?Coronary angiography 10/12/2020: ? ?Left dominant circulation ?Mild calcific disease 20% in prox-mid LAD ?LVEDP 20 mmHg ? ?Dyspnea most likely due to obesity and elevated LVEDP ?Okay to stop Aspirin ?Okay to proceed with umbilical hernia surgery ?  ? ?EKG 07/05/2020: ?Sinus rhythm 60 bpm ?Low voltage,  otherwise normal EKG ? ?Lexiscan Tetrofosmin stress test 09/08/2019: ?Lexiscan/modified Bruce nuclear stress test performed using 1-day protocol. Stress EKG is non-diagnostic, as this is pharmacological stress test. In addition, stress EKG at 79% MPHR showed sinus tachycardia, <1 mm downsloping ST segment depression in inferior leads.  ?SPECT images show medium sized, medium intensity, reversible perfusion defect in mid to apical inferior myocardium. STress LVEF 70%. ?Intermediate risk study.  ? ? ?EKG 09/05/2019: ?Sinus rhythm 68 bpm ?Normal EKG ? ? ?Ultrasound abdominal aorta 02/17/2019: ?Abdominal Aorta: No evidence of an abdominal aortic aneurysm was visualized. Very limited exam and unable to do a thorough exam of the aorta and iliac arteries.  ?Stenosis: Very limited exam. From what was seen there is no evidence of stenosis.  ?Can not rule out stenosis on segments not visualized. ? ?CT chest 02/11/2019: ?1. Lung-RADS 2S, benign appearance or behavior. Continue annual ?screening with low-dose chest CT without contrast in 12 months. ?2. The "S" modifier above refers to potentially clinically significant non lung cancer related findings.  ?Specifically, there is aortic atherosclerosis, in addition to left main and 3 vessel ?coronary artery disease. Please note that although the presence of coronary artery calcium  ?documents the presence of coronary artery disease, the severity of this disease and any potential stenosis ?cannot be assessed on this non-gated CT examination. Assessment for potential risk factor modification,  ?dietary therapy or pharmacologic therapy may be warranted, if clinically indicated. ?3.  Mild diffuse bronchial wall thickening with very mild centrilobular and paraseptal emphysema;  ?imaging findings suggestive of underlying COPD. ?4. Hepatic steatosis. ?  ?Aortic Atherosclerosis and Emphysema   ? ?Recent labs: ?02/28/2021: ?Glucose 110, BUN/Cr 18/1.06. EGFR 77. Na/K 137/4.6. Rest of the CMP  normal ?H/H 15/47. MCV 92. Platelets 246 ?HbA1C 6.0% ?Chol 139, TG 114, HDL 49, LDL 70 ? ?02/02/2020: ?Glucose 99, BUN/Cr 13/0.79. EGFR 94. Na/K 138/4.6. Rest of the CMP normal ?H/H 15/47. MCV 93. Platelets 267 ?HbA1C N/A% ?Chol 129, TG 135, HDL 45, LDL 62 ?TSH N/A ? ?01/31/2019: ?Glucose 106, BUN/Cr 25/1. EGFR 79. Na/K 141/4.4. Rest of the CMP normal ?H/H 15/44. MCV 93. Platelets 243 ?Chol 127, TG 93, HDL 46, LDL 63. ? ? ?Review of Systems  ?Cardiovascular:  Positive for dyspnea on exertion. Negative for chest pain, leg swelling, palpitations and syncope.  ?Respiratory:  Positive for shortness of breath.   ? ?   ? ? ?Vitals:  ? 06/22/21 1052  ?BP: 139/77  ?Pulse: 65  ?Resp: 16  ?Temp: 97.7 ?F (36.5 ?C)  ?SpO2: 97%  ? ? ? ?Body mass index is 47.35 kg/m?. ?Filed Weights  ? 06/22/21 1052  ?Weight: (!) 330 lb (149.7 kg)  ? ? ? ?Objective:  ? Physical Exam ?Vitals and nursing note reviewed.  ?Constitutional:   ?   General: He is not in acute distress. ?Neck:  ?   Vascular: No JVD.  ?Cardiovascular:  ?   Rate and Rhythm: Normal rate and regular rhythm.  ?   Heart sounds: Normal heart sounds. No murmur heard. ?Pulmonary:  ?   Effort: Pulmonary effort is normal.  ?   Breath sounds: Normal breath sounds. No wheezing or rales.  ?Musculoskeletal:  ?   Right lower leg: No edema.  ?   Left lower leg: No edema.  ? ? ? ?  ICD-10-CM   ?1. Exertional dyspnea  R06.09 EKG 12-Lead  ?  ?2. Mixed hyperlipidemia  E78.2   ?  ?3. Essential hypertension  I10   ?  ? ? ? ?   ?Assessment & Recommendations:  ? ?65 y.o. Caucasian male with controlled hypertension, hyperlipidemia, tobacco dependence, CAD with exertional dyspnea, abnormal stress test, morbid obesity, venous insufficiency ? ?CAD: ?Mostly medial calcification on coronary angiogram with no significant obstructive coronary artery disease seen. Okay to stop Aspirin.  ?Continue statin ?Continue regular exercise. Congratulated on his 20 lbs wt loss in 3 months. ?Low risk for upcoming  urological/abdominal surgery.  ? ?Exertional dyspnea: ?Most likely related to obesity, improving. ? ?Hypertension: ?Controlled ? ?F/u in 6 months. ? ? ?[name], MD ?[facility] ?Pager: [phone] ?Office: [phone] ?   ?

## 2021-06-23 ENCOUNTER — Encounter: Payer: Self-pay | Admitting: Cardiology

## 2021-06-24 NOTE — Telephone Encounter (Signed)
From pt

## 2021-06-28 DIAGNOSIS — G4733 Obstructive sleep apnea (adult) (pediatric): Secondary | ICD-10-CM | POA: Diagnosis not present

## 2021-07-07 ENCOUNTER — Telehealth: Payer: Medicare Other | Admitting: Family Medicine

## 2021-07-07 DIAGNOSIS — R112 Nausea with vomiting, unspecified: Secondary | ICD-10-CM | POA: Diagnosis not present

## 2021-07-07 MED ORDER — ONDANSETRON HCL 4 MG PO TABS
4.0000 mg | ORAL_TABLET | Freq: Three times a day (TID) | ORAL | 0 refills | Status: DC | PRN
Start: 2021-07-07 — End: 2022-03-23

## 2021-07-07 NOTE — Progress Notes (Signed)
E-Visit for Vomiting  We are sorry that you are not feeling well. Here is how we plan to help!  Based on what you have shared with me it looks like you have a Virus that is irritating your GI tract.  Vomiting is the forceful emptying of a portion of the stomach's content through the mouth.  Although nausea and vomiting can make you feel miserable, it's important to remember that these are not diseases, but rather symptoms of an underlying illness.  When we treat short term symptoms, we always caution that any symptoms that persist should be fully evaluated in a medical office.  I have prescribed a medication that will help alleviate your symptoms and allow you to stay hydrated:  Zofran 4 mg 1 tablet every 8 hours as needed for nausea and vomiting  HOME CARE: Drink clear liquids.  This is very important! Dehydration (the lack of fluid) can lead to a serious complication.  Start off with 1 tablespoon every 5 minutes for 8 hours. You may begin eating bland foods after 8 hours without vomiting.  Start with saltine crackers, white bread, rice, mashed potatoes, applesauce. After 48 hours on a bland diet, you may resume a normal diet. Try to go to sleep.  Sleep often empties the stomach and relieves the need to vomit.  GET HELP RIGHT AWAY IF:  Your symptoms do not improve or worsen within 2 days after treatment. You have a fever for over 3 days. You cannot keep down fluids after trying the medication.  MAKE SURE YOU:  Understand these instructions. Will watch your condition. Will get help right away if you are not doing well or get worse.   Thank you for choosing an e-visit.  Your e-visit answers were reviewed by a board certified advanced clinical practitioner to complete your personal care plan. Depending upon the condition, your plan could have included both over the counter or prescription medications.  Please review your pharmacy choice. Make sure the pharmacy is open so you can pick  up prescription now. If there is a problem, you may contact your provider through MyChart messaging and have the prescription routed to another pharmacy.  Your safety is important to us. If you have drug allergies check your prescription carefully.   For the next 24 hours you can use MyChart to ask questions about today's visit, request a non-urgent call back, or ask for a work or school excuse. You will get an email in the next two days asking about your experience. I hope that your e-visit has been valuable and will speed your recovery.  I provided 5 minutes of non face-to-face time during this encounter for chart review, medication and order placement, as well as and documentation.   

## 2021-07-08 IMAGING — CT CT CHEST LUNG CANCER SCREENING LOW DOSE W/O CM
2 of 5 series · 14 of 40 positions shown, 17 images · non-contrast
Comparison: None.

CLINICAL DATA: 63-year-old male former smoker (quit 3 days ago)
with 30 pack-year history of smoking. Lung cancer screening
examination

EXAM:
CT CHEST WITHOUT CONTRAST LOW-DOSE FOR LUNG CANCER SCREENING
TECHNIQUE: Multidetector CT imaging of the chest was performed following the
standard protocol without IV contrast.

[Series 4: lung 1.00 br44 cor · coronal · 0.69mm/px · 3 of 409 slices shown]
[im 82/409  lung]
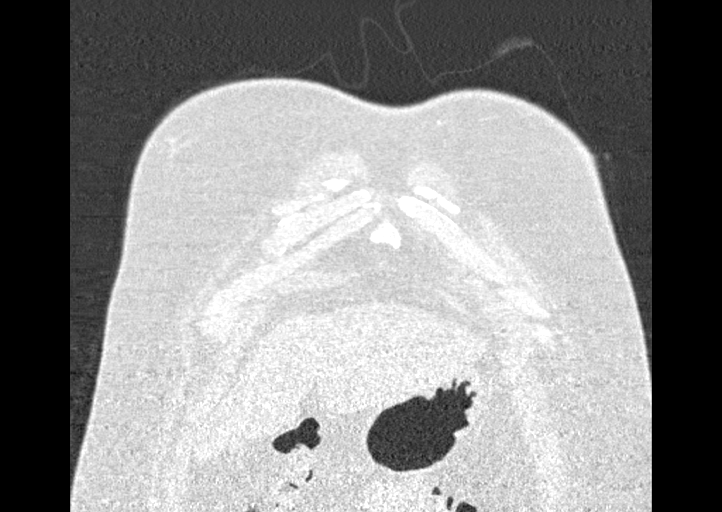
[im 164/409  lung]
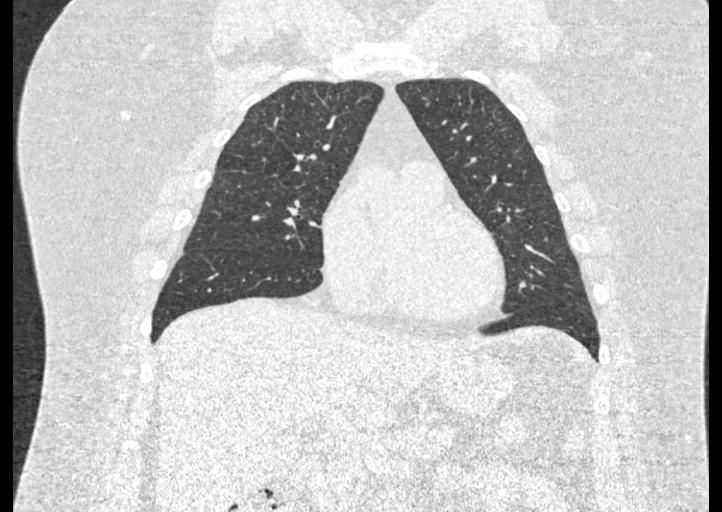
[im 245/409  lung]
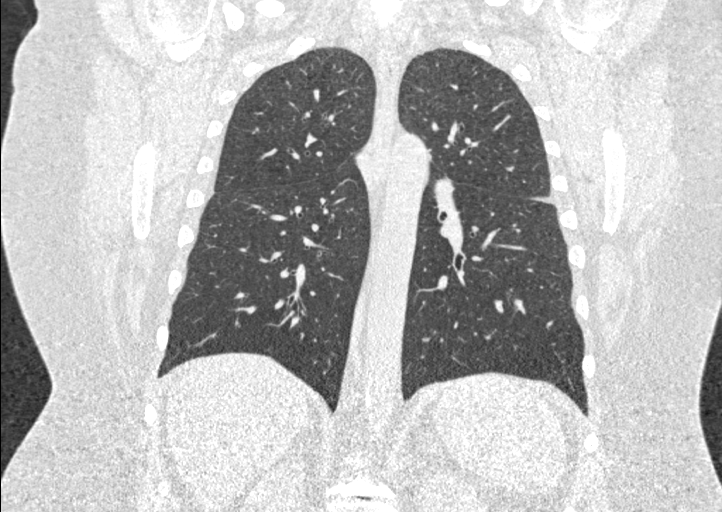

[Series 9: lung 1.00 br60 axial · axial · 0.98mm/px · z∈[-1045,-725]mm · 11 of 354 slices shown, 14 images]
[im 17/354  mediastinal]
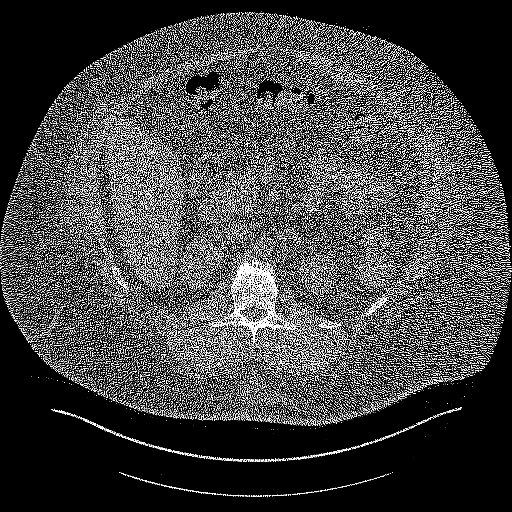
[im 17/354  lung]
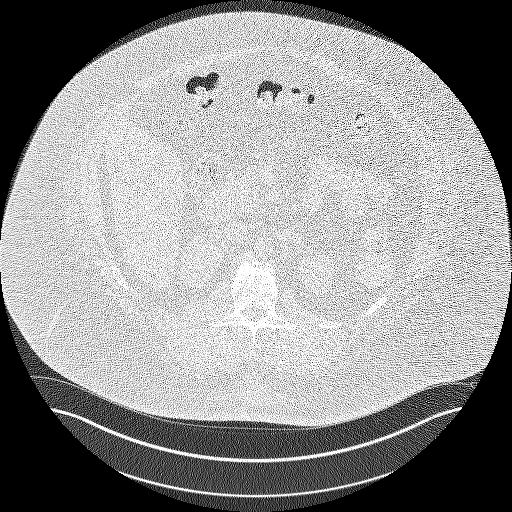
[im 51/354  lung]
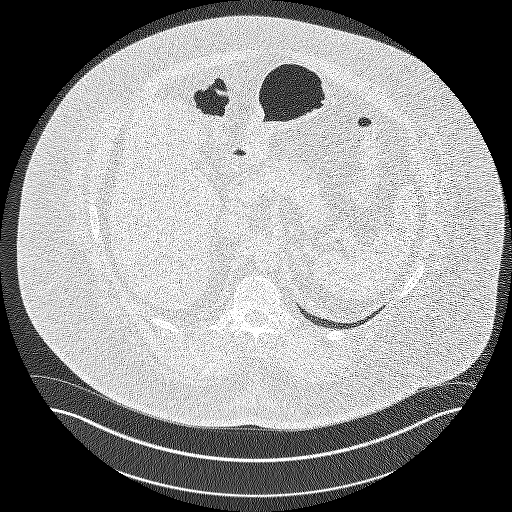
[im 85/354  lung]
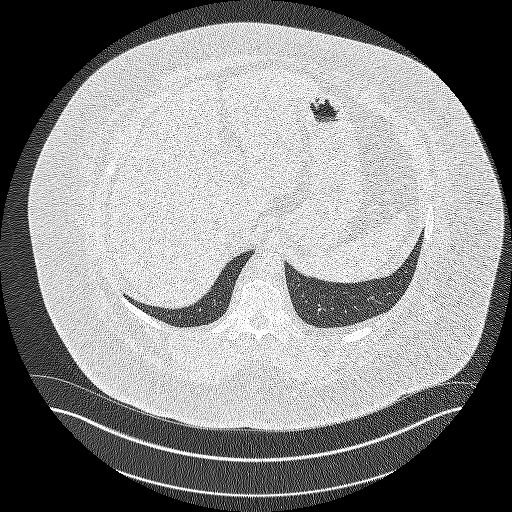
[im 118/354  lung]
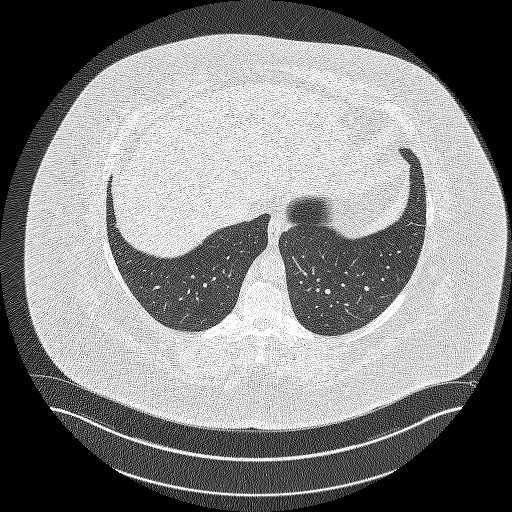
[im 152/354  mediastinal]
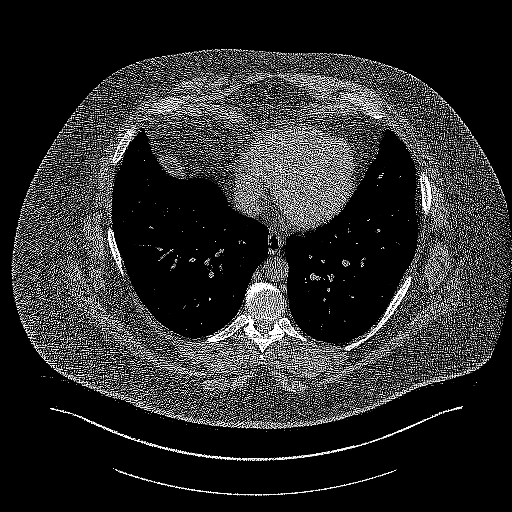
[im 152/354  lung]
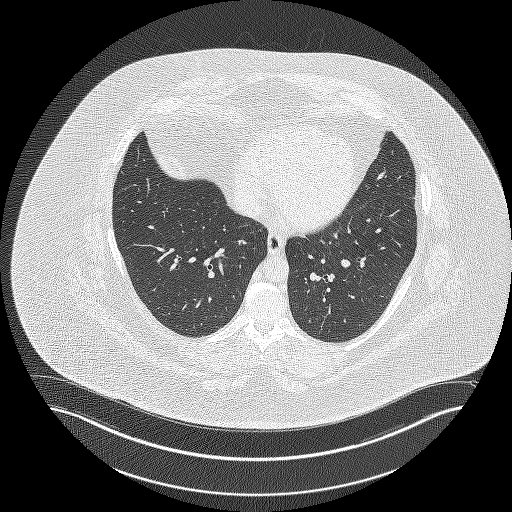
[im 185/354  lung]
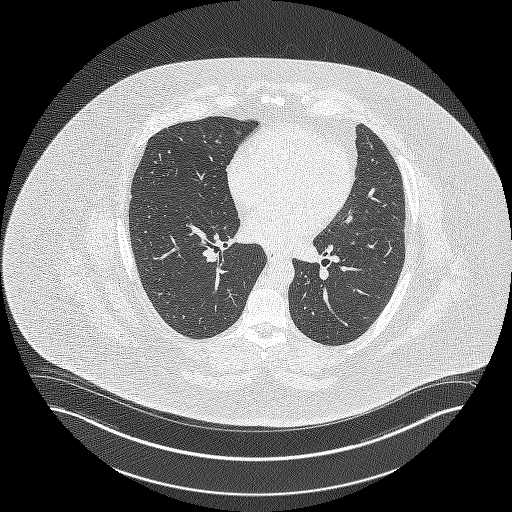
[im 202/354  lung]
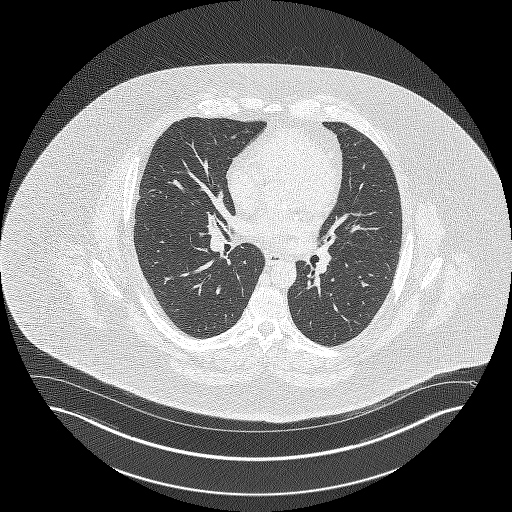
[im 236/354  lung]
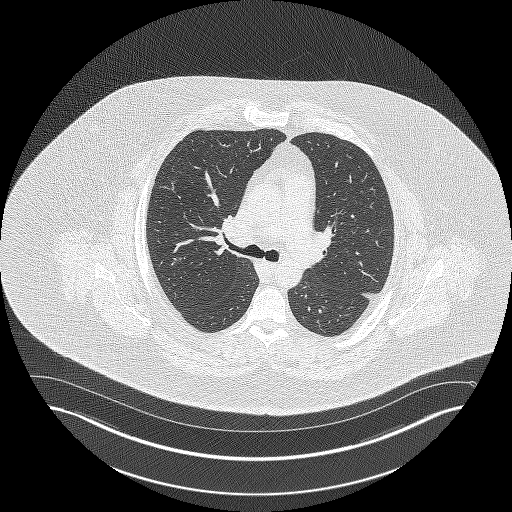
[im 269/354  mediastinal]
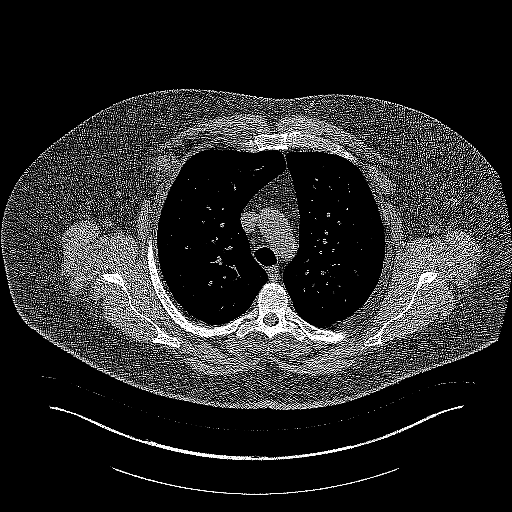
[im 269/354  lung]
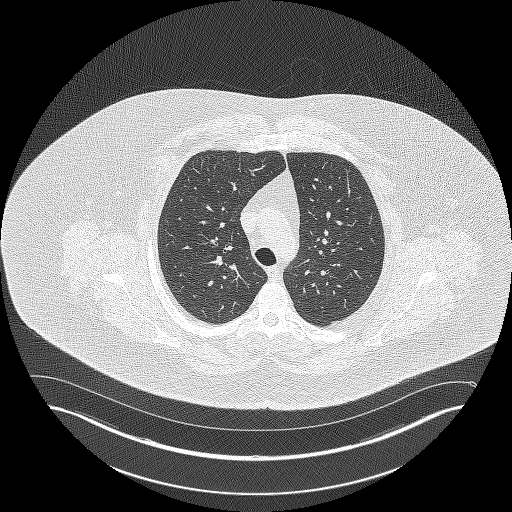
[im 303/354  lung]
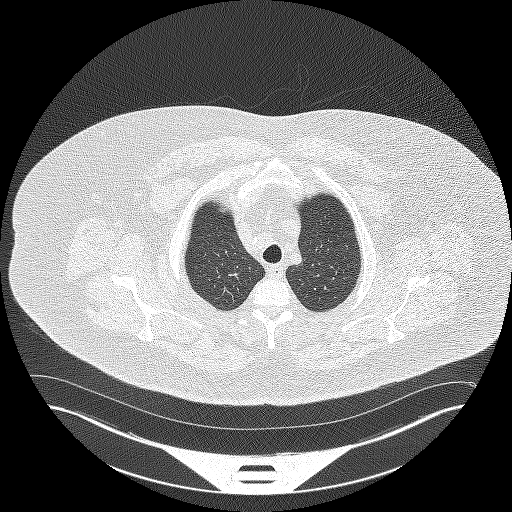
[im 337/354  lung]
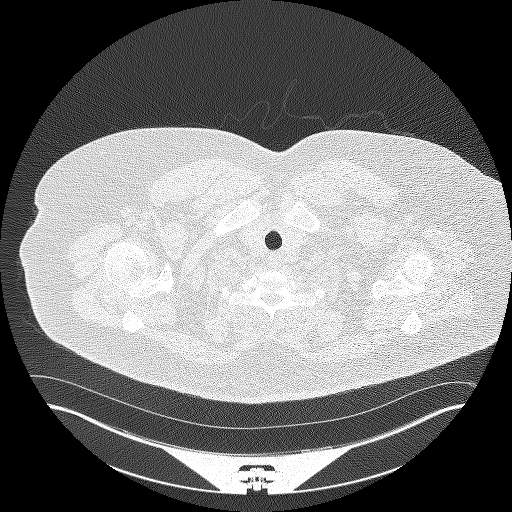

[14 of 40 positions shown; findings below may reference images not displayed]

FINDINGS: Cardiovascular: Heart size is normal. There is no significant
pericardial fluid, thickening or pericardial calcification. There is
aortic atherosclerosis, as well as atherosclerosis of the great
vessels of the mediastinum and the coronary arteries, including
calcified atherosclerotic plaque in the left main, left anterior
descending, left circumflex and right coronary arteries.

Mediastinum/Nodes: No pathologically enlarged mediastinal or hilar
lymph nodes. Please note that accurate exclusion of hilar adenopathy
is limited on noncontrast CT scans. Esophagus is unremarkable in
appearance. No axillary lymphadenopathy.

Lungs/Pleura: Small pulmonary nodules in the upper lungs
bilaterally, largest of which is in the left upper lobe near the
apex (axial image 43 of series 3), with a volume derived mean
diameter 4.3 mm. No larger more suspicious appearing pulmonary
nodules or masses are noted. No acute consolidative airspace
disease. No pleural effusions. Diffuse bronchial wall thickening
with very mild centrilobular and paraseptal emphysema.

Upper Abdomen: Aortic atherosclerosis. Diffuse low attenuation
throughout the visualized hepatic parenchyma, indicative of hepatic
steatosis.

Musculoskeletal: There are no aggressive appearing lytic or blastic
lesions noted in the visualized portions of the skeleton.
IMPRESSION: 1. Lung-RADS 2S, benign appearance or behavior. Continue annual
screening with low-dose chest CT without contrast in 12 months.
2. The "S" modifier above refers to potentially clinically
significant non lung cancer related findings. Specifically, there is
aortic atherosclerosis, in addition to left main and 3 vessel
coronary artery disease. Please note that although the presence of
coronary artery calcium documents the presence of coronary artery
disease, the severity of this disease and any potential stenosis
cannot be assessed on this non-gated CT examination. Assessment for
potential risk factor modification, dietary therapy or pharmacologic
therapy may be warranted, if clinically indicated.
3. Mild diffuse bronchial wall thickening with very mild
centrilobular and paraseptal emphysema; imaging findings suggestive
of underlying COPD.
4. Hepatic steatosis.

Aortic Atherosclerosis (K1FBW-PAW.W) and Emphysema (K1FBW-C4M.B).

## 2021-07-10 ENCOUNTER — Encounter (HOSPITAL_COMMUNITY): Payer: Self-pay

## 2021-07-10 ENCOUNTER — Ambulatory Visit (HOSPITAL_COMMUNITY)
Admission: EM | Admit: 2021-07-10 | Discharge: 2021-07-10 | Disposition: A | Discharge status: Home / Self Care | Payer: Medicare Other | Attending: Nurse Practitioner | Admitting: Nurse Practitioner

## 2021-07-10 ENCOUNTER — Other Ambulatory Visit: Payer: Self-pay

## 2021-07-10 DIAGNOSIS — N3001 Acute cystitis with hematuria: Secondary | ICD-10-CM | POA: Insufficient documentation

## 2021-07-10 LAB — POCT URINALYSIS DIPSTICK, ED / UC
Bilirubin Urine: NEGATIVE
Glucose, UA: NEGATIVE mg/dL
Ketones, ur: NEGATIVE mg/dL
Nitrite: NEGATIVE
Protein, ur: NEGATIVE mg/dL
Specific Gravity, Urine: 1.02 (ref 1.005–1.030)
Urobilinogen, UA: 1 mg/dL (ref 0.0–1.0)
pH: 5.5 (ref 5.0–8.0)

## 2021-07-10 MED ORDER — SULFAMETHOXAZOLE-TRIMETHOPRIM 800-160 MG PO TABS: 1.0000 | ORAL_TABLET | Freq: Two times a day (BID) | ORAL | 0 refills | Status: DC

## 2021-07-10 NOTE — ED Triage Notes (Signed)
Pt presents with dysuria and urinary urgency.  ?

## 2021-07-10 NOTE — Discharge Instructions (Addendum)
-   Please complete the entire antibiotic as prescribed ?-Please make sure you are drinking plenty of water ?-Follow-up with your primary care provider if your symptoms do not improve despite the treatment ?-If your symptoms worsen and you develop fevers, nausea, vomiting, extreme back pain, please go to the emergency room. ?

## 2021-07-10 NOTE — ED Provider Notes (Signed)
?[place] ? ? ? ?CSN: [id number] ?Arrival date & time: 07/10/21  1010 ? ? ?  ? ?History   ?Chief Complaint ?Chief Complaint  ?Patient presents with  ? Urinary Tract Infection  ? ? ?HPI ?CHIN WACHTER is a 66 y.o. male.  ? ?Patient reports 3-day history of burning with urination, increased urinary urgency, and urinary frequency.  He denies fevers, blood in his urine, abdominal pain or back pain, incontinence of urine, change in the odor of his urine.  He recently got over a stomach bug, however denies any current nausea or vomiting or diarrhea.  He does not recall having a urinary tract infection before.  Denies any penile discharge.  He has not taken anything for his symptoms. ? ? ? ? ?Past Medical History:  ?Diagnosis Date  ? Arthritis   ? knees  ? Asthma   ? CAD (coronary artery disease)   ? COPD (chronic obstructive pulmonary disease) ([place])   ? Diastolic dysfunction   ? GERD (gastroesophageal reflux disease)   ? Hyperlipidemia   ? Hypertension   ? Obesity   ? OSA on CPAP   ? ahi-84, 18 CWP  ? Sleep apnea   ? wears cpap   ? Smoker   ? Umbilical hernia   ? ? ?Patient Active Problem List  ? Diagnosis Date Noted  ? Essential hypertension 06/22/2021  ? 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 Abnormal stress test 07/05/2020  ? Upper respiratory tract infection 04/02/2020  ? Exertional dyspnea 09/05/2019  ? Mixed hyperlipidemia 02/20/2019  ? Coronary artery calcification   ? Bilateral sensorineural hearing loss 01/15/2017  ? Morbid obesity ([place])   ? Smoker   ? OSA on CPAP   ? Diastolic dysfunction   ? ? ?Past Surgical History:  ?Procedure Laterality Date  ? CARDIAC CATHETERIZATION    ? COLONOSCOPY  2007  ? INSERTION OF MESH N/A 12/16/2020  ? Procedure: INSERTION OF MESH;  Surgeon: Coralie Keens, MD;  Location: [facility];  Service: General;  Laterality: N/A;  ? LEFT HEART CATH AND CORONARY ANGIOGRAPHY N/A 10/12/2020  ? Procedure: LEFT HEART CATH AND CORONARY ANGIOGRAPHY;  Surgeon: [name], MD;   Location: [facility];  Service: Cardiovascular;  Laterality: N/A;  ? TONSILLECTOMY  1967  ? UMBILICAL HERNIA REPAIR N/A 12/16/2020  ? Procedure: UMBILICAL HERNIA REPAIR WITH MESH;  Surgeon: Coralie Keens, MD;  Location: [facility];  Service: General;  Laterality: N/A;  LMA  ? ? ? ? ? ?Home Medications   ? ?Prior to Admission medications   ?Medication Sig Start Date End Date Taking? Authorizing Provider  ?sulfamethoxazole-trimethoprim (BACTRIM DS) 800-160 MG tablet Take 1 tablet by mouth 2 (two) times daily for 10 days. 07/10/21 07/20/21 Yes Eulogio Bear, NP  ?atorvastatin (LIPITOR) 20 MG tablet TAKE ONE TABLET BY MOUTH ONCE DAILY 02/18/21   [name], MD  ?Budeson-Glycopyrrol-Formoterol (BREZTRI AEROSPHERE) 160-9-4.8 MCG/ACT AERO Inhale 2 puffs into the lungs 2 (two) times daily. 04/19/21   [name], MD  ?furosemide (LASIX) 40 MG tablet TAKE ONE TABLET BY MOUTH ONCE DAILY 02/18/21   [name], MD  ?lisinopril (ZESTRIL) 20 MG tablet Take 1 tablet by mouth daily. 11/22/20   [provider]  ?metoprolol tartrate (LOPRESSOR) 25 MG tablet Take 25 mg by mouth 2 (two) times daily. 02/28/21   [provider]  ?nitroGLYCERIN (NITROSTAT) 0.4 MG SL tablet Place 0.4 mg under the tongue every 5 (five) minutes as needed for  chest pain.    [provider]  ?ondansetron (ZOFRAN) 4 MG tablet Take 1 tablet (4 mg total) by mouth every 8 (eight) hours as needed for nausea or vomiting. 07/07/21   Perlie Mayo, NP  ?pantoprazole (PROTONIX) 40 MG tablet Take 1 tablet (40 mg total) by mouth daily. 03/22/21   [name], MD  ?Semaglutide, 1 MG/DOSE, 4 MG/3ML SOPN Inject 1 mg as directed once a week. 04/19/21   [name], MD  ? ? ?Family History ?Family History  ?Problem Relation Age of Onset  ? Hypertension Mother   ? Hypertension Father   ? Throat cancer Father   ? Colon cancer Neg Hx   ? Colon polyps Neg Hx   ? Esophageal cancer Neg Hx   ? Rectal cancer Neg Hx   ?  Stomach cancer Neg Hx   ? ? ?Social History ?Social History  ? ?Tobacco Use  ? Smoking status: Former  ?  Packs/day: 0.50  ?  Years: 20.00  ?  Pack years: 10.00  ?  Types: Cigarettes  ?  Quit date: 02/17/2019  ?  Years since quitting: 2.3  ? Smokeless tobacco: Never  ?Vaping Use  ? Vaping Use: Never used  ?Substance Use Topics  ? Alcohol use: No  ?  Alcohol/week: 0.0 standard drinks  ? Drug use: No  ? ? ? ?Allergies   ?Penicillins ? ? ?Review of Systems ?Review of Systems ?Per HPI ? ?Physical Exam ?Triage Vital Signs ?ED Triage Vitals  ?Enc Vitals Group  ?   BP 07/10/21 1032 112/61  ?   Pulse Rate 07/10/21 1032 (!) 56  ?   Resp 07/10/21 1032 17  ?   Temp 07/10/21 1032 98.1 ?F (36.7 ?C)  ?   Temp Source 07/10/21 1032 Oral  ?   SpO2 07/10/21 1032 96 %  ?   Weight --   ?   Height --   ?   Head Circumference --   ?   Peak Flow --   ?   Pain Score 07/10/21 1031 0  ?   Pain Loc --   ?   Pain Edu? --   ?   Excl. in [place]? --   ? ?No data found. ? ?Updated Vital Signs ?BP 112/61 (BP Location: Left Arm)   Pulse (!) 56   Temp 98.1 ?F (36.7 ?C) (Oral)   Resp 17   SpO2 96%  ? ?Visual Acuity ?Right Eye Distance:   ?Left Eye Distance:   ?Bilateral Distance:   ? ?Right Eye Near:   ?Left Eye Near:    ?Bilateral Near:    ? ?Physical Exam ?Vitals and nursing note reviewed.  ?Constitutional:   ?   General: He is not in acute distress. ?   Appearance: Normal appearance. He is obese. He is not toxic-appearing.  ?Abdominal:  ?   General: Abdomen is flat. Bowel sounds are normal. There is no distension.  ?   Palpations: Abdomen is soft.  ?   Tenderness: There is no abdominal tenderness. There is no right CVA tenderness, left CVA tenderness or guarding.  ?Skin: ?   General: Skin is warm and dry.  ?   Coloration: Skin is not jaundiced or pale.  ?   Findings: No erythema.  ?Neurological:  ?   Mental Status: He is alert and oriented to person, place, and time.  ?   Motor: No weakness.  ?   Gait: Gait normal.  ?Psychiatric:     ?  Mood and  Affect: Mood normal.     ?   Behavior: Behavior normal.  ? ? ? ?UC Treatments / Results  ?Labs ?(all labs ordered are listed, but only abnormal results are displayed) ?Labs Reviewed  ?POCT URINALYSIS DIPSTICK, ED / UC - Abnormal; Notable for the following components:  ?    Result Value  ? Hgb urine dipstick TRACE (*)   ? Leukocytes,Ua SMALL (*)   ? All other components within normal limits  ?URINE CULTURE  ? ? ?EKG ? ? ?Radiology ?No results found. ? ?Procedures ?Procedures (including critical care time) ? ?Medications Ordered in UC ?Medications - No data to display ? ?Initial Impression / Assessment and Plan / UC Course  ?I have reviewed the triage vital signs and the nursing notes. ? ?Pertinent labs & imaging results that were available during my care of the patient were reviewed by me and considered in my medical decision making (see chart for details). ? ?  ?UA today shows trace amount of hemoglobin, small amount of leukocytes, send urine for culture.  Will treat empirically for UTI with Bactrim DS twice daily for 10 days.  Encouraged plenty of hydration with water.  Follow-up with primary care provider if symptoms persist despite treatment.  If symptoms worsen, go to ER. ?Final Clinical Impressions(s) / UC Diagnoses  ? ?Final diagnoses:  ?Acute cystitis with hematuria  ? ? ? ?Discharge Instructions   ? ?  ?- Please complete the entire antibiotic as prescribed ?-Please make sure you are drinking plenty of water ?-Follow-up with your primary care provider if your symptoms do not improve despite the treatment ?-If your symptoms worsen and you develop fevers, nausea, vomiting, extreme back pain, please go to the emergency room. ? ? ? ? ?ED Prescriptions   ? ? Medication Sig Dispense Auth. Provider  ? sulfamethoxazole-trimethoprim (BACTRIM DS) 800-160 MG tablet Take 1 tablet by mouth 2 (two) times daily for 10 days. 20 tablet Eulogio Bear, NP  ? ?  ? ?PDMP not reviewed this encounter. ?  ?Eulogio Bear,  NP ?07/10/21 1054 ? ?

## 2021-07-12 LAB — URINE CULTURE: Culture: NO GROWTH

## 2021-07-13 ENCOUNTER — Ambulatory Visit (HOSPITAL_COMMUNITY)
Admission: EM | Admit: 2021-07-13 | Discharge: 2021-07-13 | Disposition: A | Discharge status: Home / Self Care | Payer: Medicare Other

## 2021-07-13 ENCOUNTER — Other Ambulatory Visit: Payer: Self-pay

## 2021-07-13 ENCOUNTER — Encounter (HOSPITAL_COMMUNITY): Payer: Self-pay | Admitting: Emergency Medicine

## 2021-07-13 DIAGNOSIS — R300 Dysuria: Secondary | ICD-10-CM | POA: Diagnosis not present

## 2021-07-13 NOTE — ED Provider Notes (Signed)
?[place] ? ? ? ?CSN: [id number] ?Arrival date & time: 07/13/21  1114 ? ? ?  ? ?History   ?Chief Complaint ?Chief Complaint  ?Patient presents with  ? Dysuria  ? ? ?HPI ?James Branch is a 66 y.o. male.  ? ?Dysuria ?Patient was last seen for this 3 days ago ?At that time had trace hemoglobin and small leukocytes on dipstick ?Had a urine culture performed that was negative ?Was advised to discontinue Bactrim ?States that he did take a dose of it this morning ?States that he has had very small amount of improvement in his dysuria since then ?He denies any suprapubic pain, back pain, nausea, vomiting, fevers ?He is otherwise feeling well ?Does state that prior to the onset of his dysuria last week he had a "stomach bug" but that has since resolved ?Denies hematuria ? ? ?Past Medical History:  ?Diagnosis Date  ? Arthritis   ? knees  ? Asthma   ? CAD (coronary artery disease)   ? COPD (chronic obstructive pulmonary disease) ([place])   ? Diastolic dysfunction   ? GERD (gastroesophageal reflux disease)   ? Hyperlipidemia   ? Hypertension   ? Obesity   ? OSA on CPAP   ? ahi-84, 18 CWP  ? Sleep apnea   ? wears cpap   ? Smoker   ? Umbilical hernia   ? ? ?Patient Active Problem List  ? Diagnosis Date Noted  ? Essential hypertension 06/22/2021  ? 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 Abnormal stress test 07/05/2020  ? Upper respiratory tract infection 04/02/2020  ? Exertional dyspnea 09/05/2019  ? Mixed hyperlipidemia 02/20/2019  ? Coronary artery calcification   ? Bilateral sensorineural hearing loss 01/15/2017  ? Morbid obesity ([place])   ? Smoker   ? OSA on CPAP   ? Diastolic dysfunction   ? ? ?Past Surgical History:  ?Procedure Laterality Date  ? CARDIAC CATHETERIZATION    ? COLONOSCOPY  2007  ? INSERTION OF MESH N/A 12/16/2020  ? Procedure: INSERTION OF MESH;  Surgeon: Coralie Keens, MD;  Location: [facility];  Service: General;  Laterality: N/A;  ? LEFT HEART CATH AND CORONARY ANGIOGRAPHY N/A 10/12/2020  ? Procedure:  LEFT HEART CATH AND CORONARY ANGIOGRAPHY;  Surgeon: [name], MD;  Location: [facility];  Service: Cardiovascular;  Laterality: N/A;  ? TONSILLECTOMY  1967  ? UMBILICAL HERNIA REPAIR N/A 12/16/2020  ? Procedure: UMBILICAL HERNIA REPAIR WITH MESH;  Surgeon: Coralie Keens, MD;  Location: [facility];  Service: General;  Laterality: N/A;  LMA  ? ? ? ? ? ?Home Medications   ? ?Prior to Admission medications   ?Medication Sig Start Date End Date Taking? Authorizing Provider  ?sulfamethoxazole-trimethoprim (BACTRIM DS) 800-160 MG tablet Take 1 tablet by mouth 2 (two) times daily for 10 days. 07/10/21 07/20/21 Yes Eulogio Bear, NP  ?atorvastatin (LIPITOR) 20 MG tablet TAKE ONE TABLET BY MOUTH ONCE DAILY 02/18/21   [name], MD  ?Budeson-Glycopyrrol-Formoterol (BREZTRI AEROSPHERE) 160-9-4.8 MCG/ACT AERO Inhale 2 puffs into the lungs 2 (two) times daily. 04/19/21   [name], MD  ?furosemide (LASIX) 40 MG tablet TAKE ONE TABLET BY MOUTH ONCE DAILY 02/18/21   [name], MD  ?lisinopril (ZESTRIL) 20 MG tablet Take 1 tablet by mouth daily. 11/22/20   [provider]  ?metoprolol tartrate (LOPRESSOR) 25 MG tablet Take 25 mg by mouth 2 (two) times daily. 02/28/21   [provider]  ?nitroGLYCERIN (NITROSTAT) 0.4  MG SL tablet Place 0.4 mg under the tongue every 5 (five) minutes as needed for chest pain.    [provider]  ?ondansetron (ZOFRAN) 4 MG tablet Take 1 tablet (4 mg total) by mouth every 8 (eight) hours as needed for nausea or vomiting. 07/07/21   Perlie Mayo, NP  ?pantoprazole (PROTONIX) 40 MG tablet Take 1 tablet (40 mg total) by mouth daily. 03/22/21   [name], MD  ?Semaglutide, 1 MG/DOSE, 4 MG/3ML SOPN Inject 1 mg as directed once a week. 04/19/21   [name], MD  ? ? ?Family History ?Family History  ?Problem Relation Age of Onset  ? Hypertension Mother   ? Hypertension Father   ? Throat cancer Father   ? Colon cancer Neg Hx   ? Colon  polyps Neg Hx   ? Esophageal cancer Neg Hx   ? Rectal cancer Neg Hx   ? Stomach cancer Neg Hx   ? ? ?Social History ?Social History  ? ?Tobacco Use  ? Smoking status: Former  ?  Packs/day: 0.50  ?  Years: 20.00  ?  Pack years: 10.00  ?  Types: Cigarettes  ?  Quit date: 02/17/2019  ?  Years since quitting: 2.4  ? Smokeless tobacco: Never  ?Vaping Use  ? Vaping Use: Never used  ?Substance Use Topics  ? Alcohol use: No  ?  Alcohol/week: 0.0 standard drinks  ? Drug use: No  ? ? ? ?Allergies   ?Penicillins ? ? ?Review of Systems ?Review of Systems  ?All other systems reviewed and are negative. ? ?Per HPI ?Physical Exam ?Triage Vital Signs ?ED Triage Vitals  ?Enc Vitals Group  ?   BP   ?   Pulse   ?   Resp   ?   Temp   ?   Temp src   ?   SpO2   ?   Weight   ?   Height   ?   Head Circumference   ?   Peak Flow   ?   Pain Score   ?   Pain Loc   ?   Pain Edu?   ?   Excl. in [place]?   ? ?No data found. ? ?Updated Vital Signs ?BP 127/89   Pulse 63   Temp 97.9 ?F (36.6 ?C)   Resp 18   SpO2 94%  ? ?Visual Acuity ?Right Eye Distance:   ?Left Eye Distance:   ?Bilateral Distance:   ? ?Right Eye Near:   ?Left Eye Near:    ?Bilateral Near:    ? ?Physical Exam ?Constitutional:   ?   General: He is not in acute distress. ?   Appearance: Normal appearance. He is not ill-appearing.  ?HENT:  ?   Head: Normocephalic and atraumatic.  ?Eyes:  ?   Conjunctiva/sclera: Conjunctivae normal.  ?Cardiovascular:  ?   Rate and Rhythm: Normal rate.  ?Pulmonary:  ?   Effort: Pulmonary effort is normal. No respiratory distress.  ?Abdominal:  ?   Palpations: Abdomen is soft.  ?   Tenderness: There is no abdominal tenderness. There is no right CVA tenderness, left CVA tenderness or guarding.  ?Musculoskeletal:  ?   Cervical back: Normal range of motion.  ?Skin: ?   General: Skin is warm and dry.  ?Neurological:  ?   Mental Status: He is alert and oriented to person, place, and time.  ?Psychiatric:     ?   Mood and Affect: Mood  normal.     ?   Behavior:  Behavior normal.  ? ? ? ?UC Treatments / Results  ?Labs ?(all labs ordered are listed, but only abnormal results are displayed) ?Labs Reviewed - No data to display ? ?EKG ? ? ?Radiology ?No results found. ? ?Procedures ?Procedures (including critical care time) ? ?Medications Ordered in UC ?Medications - No data to display ? ?Initial Impression / Assessment and Plan / UC Course  ?I have reviewed the triage vital signs and the nursing notes. ? ?Pertinent labs & imaging results that were available during my care of the patient were reviewed by me and considered in my medical decision making (see chart for details). ? ?  ? ?Discussed with patient that given his continued dysuria in the setting of a negative culture that he would likely benefit from a urinalysis with microscopic evaluation, especially since it appears he has had hematuria in the past on dipstick.  Discussed with him that best option would be for him to follow-up with his primary care provider so that this can be performed, as this cannot be performed in the urgent care setting.  If he truly has microscopic hematuria, would recommend a referral to urology for further evaluation.  He is in agreement with the plan and questions were answered.  Given return precautions, see AVS. ? ? ?Final Clinical Impressions(s) / UC Diagnoses  ? ?Final diagnoses:  ?Dysuria  ? ? ? ?Discharge Instructions   ? ?  ?As we discussed, your urine culture did not show any signs of infection.  It does look like you have had a small amount of blood on the testing that we have done in the urgent care, but in order to confirm this you need to have an evaluation of your urine done under the microscope.  I recommend following up with your primary care provider for this and they can determine if you need to see a urologist based on this and your symptoms. ? ?If you get severely worse, especially if you have pain in your back, fever, difficulty tolerating anything by mouth, you should be  seen by medical provider right away. ? ? ? ? ?ED Prescriptions   ?None ?  ? ?PDMP not reviewed this encounter. ?  ?Cleophas Dunker, DO ?07/13/21 1229 ? ?

## 2021-07-13 NOTE — Discharge Instructions (Addendum)
As we discussed, your urine culture did not show any signs of infection.  It does look like you have had a small amount of blood on the testing that we have done in the urgent care, but in order to confirm this you need to have an evaluation of your urine done under the microscope.  I recommend following up with your primary care provider for this and they can determine if you need to see a urologist based on this and your symptoms. ? ?If you get severely worse, especially if you have pain in your back, fever, difficulty tolerating anything by mouth, you should be seen by medical provider right away. ?

## 2021-07-13 NOTE — ED Triage Notes (Signed)
Pt is present today with dysuria. Pt states that his sx have not resolved since his last visit  ?

## 2021-07-14 ENCOUNTER — Ambulatory Visit (INDEPENDENT_AMBULATORY_CARE_PROVIDER_SITE_OTHER): Payer: Medicare Other | Admitting: Family Medicine

## 2021-07-14 VITALS — BP 130/56 | HR 65 | Temp 97.1°F | Ht 70.0 in | Wt 331.6 lb

## 2021-07-14 DIAGNOSIS — R300 Dysuria: Secondary | ICD-10-CM

## 2021-07-14 DIAGNOSIS — N410 Acute prostatitis: Secondary | ICD-10-CM | POA: Diagnosis not present

## 2021-07-14 LAB — URINALYSIS, ROUTINE W REFLEX MICROSCOPIC
Bilirubin Urine: NEGATIVE
Glucose, UA: NEGATIVE
Hgb urine dipstick: NEGATIVE
Ketones, ur: NEGATIVE
Leukocytes,Ua: NEGATIVE
Nitrite: NEGATIVE
Protein, ur: NEGATIVE
Specific Gravity, Urine: 1.02 (ref 1.001–1.035)
pH: 6.5 (ref 5.0–8.0)

## 2021-07-14 MED ORDER — SULFAMETHOXAZOLE-TRIMETHOPRIM 800-160 MG PO TABS: 1.0000 | ORAL_TABLET | Freq: Two times a day (BID) | ORAL | 0 refills | Status: DC

## 2021-07-14 NOTE — Progress Notes (Signed)
? ?Subjective:  ? ? Patient ID: James Branch, male    DOB: [birth year], 66 y.o.   MRN: [id number] ? ?Patient is a 66 year old Caucasian gentleman who went to an urgent care on March 26 with dysuria.  He states that whenever he pees, it feels like it is burning.  It hurts and stings.  He does not feel like he is able to empty his bladder completely.  At the urgent care there was trace blood and trace leukocyte esterase.  They started him on Bactrim and send a urine culture.  He took 4 days of antibiotics.  The symptoms started improving and for 1 day he was symptom-free.  They called and informed that his urine culture was negative for any infection and therefore they asked him to stop the antibiotics.  He is here today to follow-up.  Urinalysis today shows no blood, no nitrates, no leukocyte esterase.  However he continues to have some mild dysuria when he urinates.  I performed a prostate exam today and the prostate is swollen and tender to palpation but there is no nodularity ?Past Medical History:  ?Diagnosis Date  ? Arthritis   ? knees  ? Asthma   ? CAD (coronary artery disease)   ? COPD (chronic obstructive pulmonary disease) ([place])   ? Diastolic dysfunction   ? GERD (gastroesophageal reflux disease)   ? Hyperlipidemia   ? Hypertension   ? Obesity   ? OSA on CPAP   ? ahi-84, 18 CWP  ? Sleep apnea   ? wears cpap   ? Smoker   ? Umbilical hernia   ? ?Past Surgical History:  ?Procedure Laterality Date  ? CARDIAC CATHETERIZATION    ? COLONOSCOPY  2007  ? INSERTION OF MESH N/A 12/16/2020  ? Procedure: INSERTION OF MESH;  Surgeon: Coralie Keens, MD;  Location: [facility];  Service: General;  Laterality: N/A;  ? LEFT HEART CATH AND CORONARY ANGIOGRAPHY N/A 10/12/2020  ? Procedure: LEFT HEART CATH AND CORONARY ANGIOGRAPHY;  Surgeon: [name], MD;  Location: [facility];  Service: Cardiovascular;  Laterality: N/A;  ? TONSILLECTOMY  1967  ? UMBILICAL HERNIA REPAIR N/A 12/16/2020  ? Procedure: UMBILICAL HERNIA  REPAIR WITH MESH;  Surgeon: Coralie Keens, MD;  Location: [facility];  Service: General;  Laterality: N/A;  LMA  ? ? ? ?Allergies  ?Allergen Reactions  ? Penicillins Swelling  ? ?Social History  ? ?Socioeconomic History  ? Marital status: Married  ?  Spouse name: Dory Larsen  ? Number of children: 4  ? Years of education: Not on file  ? Highest education level: Not on file  ?Occupational History  ? Occupation: Retired  ?Tobacco Use  ? Smoking status: Former  ?  Packs/day: 0.50  ?  Years: 20.00  ?  Pack years: 10.00  ?  Types: Cigarettes  ?  Quit date: 02/17/2019  ?  Years since quitting: 2.4  ? Smokeless tobacco: Never  ?Vaping Use  ? Vaping Use: Never used  ?Substance and Sexual Activity  ? Alcohol use: No  ?  Alcohol/week: 0.0 standard drinks  ? Drug use: No  ? Sexual activity: Yes  ?  Birth control/protection: Condom  ?Other Topics Concern  ? Not on file  ?Social History Narrative  ? 4 children, 18 grandchildren.  ? ?Social Determinants of Health  ? ?Financial Resource Strain: Low Risk   ? Difficulty of Paying Living Expenses: Not hard at all  ?Food Insecurity: No Food Insecurity  ?  Worried About Charity fundraiser in the Last Year: Never true  ? Ran Out of Food in the Last Year: Never true  ?Transportation Needs: No Transportation Needs  ? Lack of Transportation (Medical): No  ? Lack of Transportation (Non-Medical): No  ?Physical Activity: Insufficiently Active  ? Days of Exercise per Week: 5 days  ? Minutes of Exercise per Session: 10 min  ?Stress: No Stress Concern Present  ? Feeling of Stress : Not at all  ?Social Connections: Moderately Isolated  ? Frequency of Communication with Friends and Family: More than three times a week  ? Frequency of Social Gatherings with Friends and Family: More than three times a week  ? Attends Religious Services: Never  ? Active Member of Clubs or Organizations: No  ? Attends Archivist Meetings: Never  ? Marital Status: Married  ?Intimate Partner Violence: Not At Risk  ?  Fear of Current or Ex-Partner: No  ? Emotionally Abused: No  ? Physically Abused: No  ? Sexually Abused: No  ? ? ? ? ?Review of Systems  ?All other systems reviewed and are negative. ? ?   ?Objective:  ? Physical Exam ?Vitals reviewed.  ?Constitutional:   ?   General: He is not in acute distress. ?   Appearance: Normal appearance. He is obese. He is not ill-appearing or toxic-appearing.  ?Cardiovascular:  ?   Rate and Rhythm: Normal rate and regular rhythm.  ?   Heart sounds: Normal heart sounds. No murmur heard. ?Pulmonary:  ?   Effort: Pulmonary effort is normal. No accessory muscle usage or respiratory distress.  ?   Breath sounds: Decreased breath sounds and wheezing present.  ?Abdominal:  ?   General: Bowel sounds are normal. There is no distension.  ?   Palpations: Abdomen is soft.  ?Genitourinary: ?   Prostate: Enlarged and tender.  ?Neurological:  ?   Mental Status: He is alert.  ? ? ? ? ? ?   ?Assessment & Plan:  ?Dysuria - Plan: Urinalysis, Routine w reflex microscopic ? ?Acute prostatitis - Plan: sulfamethoxazole-trimethoprim (BACTRIM DS) 800-160 MG tablet ?I believe the patient has acute prostatitis.  I have asked him to take an additional 10 days of Bactrim given the fact his symptoms are starting to improve prior to discontinuing antibiotics.  Recheck in 10 days if symptoms are not completely gone. ?

## 2021-07-21 NOTE — Telephone Encounter (Signed)
I have spoken with wife and advised that we have changed the CPT code for DOS 03/22/2021 to a physical code that was performed by Dr. Dennard Schaumann. She verbalized understanding that she could disregard bill at this time.  ?

## 2021-07-21 NOTE — Telephone Encounter (Signed)
I have reviewed patients account it looks like DOS 02/10/2021 was paid (health nurse advisor) and DOS 03/22/2021 wasn't paid due to same CPT code [phone]. I am going to send Suanne Marker a message to advise. ?

## 2021-08-10 DIAGNOSIS — Z01818 Encounter for other preprocedural examination: Secondary | ICD-10-CM | POA: Diagnosis not present

## 2021-08-22 ENCOUNTER — Other Ambulatory Visit: Payer: Self-pay | Admitting: Cardiology

## 2021-08-22 DIAGNOSIS — I2510 Atherosclerotic heart disease of native coronary artery without angina pectoris: Secondary | ICD-10-CM

## 2021-08-23 ENCOUNTER — Telehealth: Payer: Self-pay | Admitting: Pharmacist

## 2021-08-23 NOTE — Progress Notes (Signed)
? ? ?  Chronic Care Management ?Pharmacy Assistant  ? ?Name: James Branch  MRN: [id number] DOB: 1955/12/10 ? ? ?Reason for Encounter: Monthly Medication Coordination Call  ?  ? ? ?Medications: ?Outpatient Encounter Medications as of 08/23/2021  ?Medication Sig Note  ? atorvastatin (LIPITOR) 20 MG tablet TAKE ONE TABLET BY MOUTH ONCE DAILY   ? Budeson-Glycopyrrol-Formoterol (BREZTRI AEROSPHERE) 160-9-4.8 MCG/ACT AERO Inhale 2 puffs into the lungs 2 (two) times daily.   ? furosemide (LASIX) 40 MG tablet TAKE ONE TABLET BY MOUTH ONCE DAILY   ? lisinopril (ZESTRIL) 20 MG tablet Take 1 tablet by mouth daily.   ? metoprolol tartrate (LOPRESSOR) 25 MG tablet Take 25 mg by mouth 2 (two) times daily.   ? nitroGLYCERIN (NITROSTAT) 0.4 MG SL tablet Place 0.4 mg under the tongue every 5 (five) minutes as needed for chest pain. 12/16/2020: Never used  ? ondansetron (ZOFRAN) 4 MG tablet Take 1 tablet (4 mg total) by mouth every 8 (eight) hours as needed for nausea or vomiting.   ? pantoprazole (PROTONIX) 40 MG tablet Take 1 tablet (40 mg total) by mouth daily.   ? Semaglutide, 1 MG/DOSE, 4 MG/3ML SOPN Inject 1 mg as directed once a week.   ? sulfamethoxazole-trimethoprim (BACTRIM DS) 800-160 MG tablet Take 1 tablet by mouth 2 (two) times daily.   ? ?No facility-administered encounter medications on file as of 08/23/2021.  ? ? ?Reviewed chart for medication changes ahead of medication coordination call. ? ?No OVs, Consults, or hospital visits since last care coordination call/Pharmacist visit. (If appropriate, list visit date, provider name) ? ?No medication changes indicated OR if recent visit, treatment plan here. ? ?BP Readings from Last 3 Encounters:  ?07/14/21 (!) 130/56  ?07/13/21 127/89  ?07/10/21 112/61  ?  ?Lab Results  ?Component Value Date  ? HGBA1C 6.0 (H) 02/24/2021  ?  ? ?Patient obtains medications through Vials  90 Days  ? ?Last adherence delivery included: (medication name and frequency) ?Albuterol Sulfate 90 mcg  daily ?Atorvastatin 20 mg daily ?Furosemide 40 mg daily ?Metoprolol tartrate 25 mg twice daily  ?Lisinopril 20 mg daily ?Breztri Inhaler 2 puffs twice a day  ? ? ?Patient is due for next adherence delivery on: 09/02/21. ?Called patient and reviewed medications and coordinated delivery. ? ?This delivery to include: ?Atorvastatin 20 mg daily ?Furosemide 40 mg daily ?Metoprolol tartrate 25 mg twice daily  ?Lisinopril 20 mg daily ?Pantoprazole 40 mg daily ?Breztri Inhaler 2 puffs twice a day  ? ?Patient needs refills from specialist: Metoprolol Tart '25mg'$  1 tablet twice daily (refill request sent by pharmacy) ? ?Confirmed delivery date of 09/02/21, advised patient that pharmacy will contact them the morning of delivery. ? ? ?Care Gaps ?  ?AWV: done 03/22/21 ?Colonoscopy: done 03/05/19 ?DM Eye Exam: N/A ?DM Foot Exam: N/A ?Microalbumin: never ?HbgAIC: done 02/24/21 (6.0) ?DEXA: N/A ?Mammogram: N/A ?  ?Star Rating Drugs: ?Lisinopril (ZESTRIL) 20 MG tablet - last filled 05/30/21 90 days  ?Atorvastatin (LIPITOR) 20 MG tablet - last filled 05/30/21 90 days  ?Semaglutide 1 MG/DOSE, 4 MG/3ML SOPN - last filled 05/23/21 28 days  ? ? ? ?Future Appointments  ?Date Time Provider [place]  ?02/10/2022  9:00 AM BSFM-NURSE HEALTH ADVISOR [facility]  ?06/23/2022  1:00 PM Patwardhan, Reynold Bowen, MD [facility] None  ? ? ? ?Liza Showfety, CCMA ?Clinical Pharmacist Assistant  ?[phone] ? ?

## 2021-08-26 DIAGNOSIS — L909 Atrophic disorder of skin, unspecified: Secondary | ICD-10-CM | POA: Diagnosis not present

## 2021-08-26 DIAGNOSIS — J449 Chronic obstructive pulmonary disease, unspecified: Secondary | ICD-10-CM | POA: Diagnosis not present

## 2021-08-26 DIAGNOSIS — I96 Gangrene, not elsewhere classified: Secondary | ICD-10-CM | POA: Diagnosis not present

## 2021-08-26 DIAGNOSIS — Z9989 Dependence on other enabling machines and devices: Secondary | ICD-10-CM | POA: Diagnosis not present

## 2021-08-26 DIAGNOSIS — Z79899 Other long term (current) drug therapy: Secondary | ICD-10-CM | POA: Diagnosis not present

## 2021-08-26 DIAGNOSIS — L859 Epidermal thickening, unspecified: Secondary | ICD-10-CM | POA: Diagnosis not present

## 2021-08-26 DIAGNOSIS — I2510 Atherosclerotic heart disease of native coronary artery without angina pectoris: Secondary | ICD-10-CM | POA: Diagnosis not present

## 2021-08-26 DIAGNOSIS — K219 Gastro-esophageal reflux disease without esophagitis: Secondary | ICD-10-CM | POA: Diagnosis not present

## 2021-08-26 DIAGNOSIS — I10 Essential (primary) hypertension: Secondary | ICD-10-CM | POA: Diagnosis not present

## 2021-08-26 DIAGNOSIS — E65 Localized adiposity: Secondary | ICD-10-CM | POA: Diagnosis not present

## 2021-08-26 DIAGNOSIS — G4733 Obstructive sleep apnea (adult) (pediatric): Secondary | ICD-10-CM | POA: Diagnosis not present

## 2021-08-26 DIAGNOSIS — Z87891 Personal history of nicotine dependence: Secondary | ICD-10-CM | POA: Diagnosis not present

## 2021-08-26 DIAGNOSIS — E785 Hyperlipidemia, unspecified: Secondary | ICD-10-CM | POA: Diagnosis not present

## 2021-08-27 DIAGNOSIS — E785 Hyperlipidemia, unspecified: Secondary | ICD-10-CM | POA: Diagnosis not present

## 2021-08-27 DIAGNOSIS — J449 Chronic obstructive pulmonary disease, unspecified: Secondary | ICD-10-CM | POA: Diagnosis not present

## 2021-08-27 DIAGNOSIS — I2510 Atherosclerotic heart disease of native coronary artery without angina pectoris: Secondary | ICD-10-CM | POA: Diagnosis not present

## 2021-08-27 DIAGNOSIS — I96 Gangrene, not elsewhere classified: Secondary | ICD-10-CM | POA: Diagnosis not present

## 2021-08-27 DIAGNOSIS — L859 Epidermal thickening, unspecified: Secondary | ICD-10-CM | POA: Diagnosis not present

## 2021-08-27 DIAGNOSIS — K219 Gastro-esophageal reflux disease without esophagitis: Secondary | ICD-10-CM | POA: Diagnosis not present

## 2021-08-27 DIAGNOSIS — Z87891 Personal history of nicotine dependence: Secondary | ICD-10-CM | POA: Diagnosis not present

## 2021-08-27 DIAGNOSIS — Z9989 Dependence on other enabling machines and devices: Secondary | ICD-10-CM | POA: Diagnosis not present

## 2021-08-27 DIAGNOSIS — I10 Essential (primary) hypertension: Secondary | ICD-10-CM | POA: Diagnosis not present

## 2021-08-27 DIAGNOSIS — G4733 Obstructive sleep apnea (adult) (pediatric): Secondary | ICD-10-CM | POA: Diagnosis not present

## 2021-08-27 DIAGNOSIS — E65 Localized adiposity: Secondary | ICD-10-CM | POA: Diagnosis not present

## 2021-08-27 DIAGNOSIS — Z79899 Other long term (current) drug therapy: Secondary | ICD-10-CM | POA: Diagnosis not present

## 2021-08-30 ENCOUNTER — Telehealth: Payer: Self-pay

## 2021-08-30 ENCOUNTER — Telehealth: Payer: Self-pay | Admitting: Pharmacist

## 2021-08-30 NOTE — Telephone Encounter (Signed)
Sure. Please send refill for half dose bid 90 pillsX1 refill. He may be able to wean off and stop this medication in near future. ? ?Thanks ?MJP ? ?

## 2021-08-30 NOTE — Progress Notes (Signed)
? ? ? ?  Chronic Care Management ?Pharmacy Assistant  ? ?Name: James Branch  MRN: [id number] DOB: 10/21/1955 ? ? ?Reason for Encounter: Rx refill request ?  ? ?Called [facility] and spoke to [employer] about getting a message to patient's provider to approve Metoprolol Tart 25 mg 1 tablet BID for patient to [facility] for his upcoming order on Friday.  ? ? ?Liza Showfety, CCMA ?Clinical Pharmacist Assistant  ?[phone] ? ?

## 2021-08-30 NOTE — Telephone Encounter (Signed)
[facility] called requesting a refill for the pts metoprolol tartrate 25 mg BID. It says this medication was prescribed by a historical provide. They would like to know if you can take over filling this medication. Please advise. ?

## 2021-08-31 ENCOUNTER — Other Ambulatory Visit: Payer: Self-pay

## 2021-08-31 MED ORDER — METOPROLOL TARTRATE 25 MG PO TABS
12.5000 mg | ORAL_TABLET | Freq: Two times a day (BID) | ORAL | 3 refills | Status: DC
Start: 2021-08-31 — End: 2022-02-17

## 2021-08-31 NOTE — Telephone Encounter (Signed)
Pt wife called back and she was informed about the message above

## 2021-08-31 NOTE — Telephone Encounter (Signed)
Called pt no answer left a vm I have send his new prescription to the pharmacy

## 2021-09-01 DIAGNOSIS — Z09 Encounter for follow-up examination after completed treatment for conditions other than malignant neoplasm: Secondary | ICD-10-CM | POA: Diagnosis not present

## 2021-09-29 DIAGNOSIS — Z09 Encounter for follow-up examination after completed treatment for conditions other than malignant neoplasm: Secondary | ICD-10-CM | POA: Diagnosis not present

## 2021-10-07 ENCOUNTER — Telehealth: Payer: Self-pay | Admitting: Pharmacist

## 2021-10-07 NOTE — Progress Notes (Signed)
Chronic Care Management Pharmacy Assistant   Name: MINER KORAL  MRN: [id number] DOB: Dec 14, 1955   Reason for Encounter: Disease State - General Adherence Call     Recent office visits:  None noted.   Recent consult visits:  None noted.   Hospital visits: 07/13/21 Medication Reconciliation was completed by comparing discharge summary, patient's EMR and Pharmacy list, and upon discussion with patient.  Admitted to the hospital on 07/13/21 due to Dysuria. Discharge date was 07/13/21. Discharged from [facility].  New?Medications Started at [facility] Discharge:?? None noted.   Medication Changes at Hospital Discharge: None noted.   Medications Discontinued at Hospital Discharge: None noted.   Medications that remain the same after Hospital Discharge:??  All other medications will remain the same.    Hospital visits:  Medication Reconciliation was completed by comparing discharge summary, patient's EMR and Pharmacy list, and upon discussion with patient.  Admitted to the hospital on 07/10/21 due to Dysuria. Discharge date was 07/10/21. Discharged from [facility].   New?Medications Started at [facility] Discharge:?? None noted.   Medication Changes at Hospital Discharge: None noted.   Medications Discontinued at Hospital Discharge: None noted  Medications that remain the same after Hospital Discharge:??  All other medications will remain the same.   Medications: Outpatient Encounter Medications as of 10/07/2021  Medication Sig Note   atorvastatin (LIPITOR) 20 MG tablet TAKE ONE TABLET BY MOUTH ONCE DAILY    Budeson-Glycopyrrol-Formoterol (BREZTRI AEROSPHERE) 160-9-4.8 MCG/ACT AERO Inhale 2 puffs into the lungs 2 (two) times daily.    furosemide (LASIX) 40 MG tablet TAKE ONE TABLET BY MOUTH ONCE DAILY    lisinopril (ZESTRIL) 20 MG tablet Take 1 tablet by mouth daily.    metoprolol tartrate (LOPRESSOR) 25 MG tablet Take 0.5 tablets (12.5 mg total) by mouth 2  (two) times daily.    nitroGLYCERIN (NITROSTAT) 0.4 MG SL tablet Place 0.4 mg under the tongue every 5 (five) minutes as needed for chest pain. 12/16/2020: Never used   ondansetron (ZOFRAN) 4 MG tablet Take 1 tablet (4 mg total) by mouth every 8 (eight) hours as needed for nausea or vomiting.    pantoprazole (PROTONIX) 40 MG tablet Take 1 tablet (40 mg total) by mouth daily.    Semaglutide, 1 MG/DOSE, 4 MG/3ML SOPN Inject 1 mg as directed once a week.    sulfamethoxazole-trimethoprim (BACTRIM DS) 800-160 MG tablet Take 1 tablet by mouth 2 (two) times daily.    No facility-administered encounter medications on file as of 10/07/2021.    [place] for General Review Call   Chart Review:  Have there been any documented new, changed, or discontinued medications since last visit? No (If yes, include name, dose, frequency, date) Has there been any documented recent hospitalizations or ED visits since last visit with Clinical Pharmacist? No Brief Summary (including medication and/or Diagnosis changes):   Adherence Review:  Does the Clinical Pharmacist Assistant have access to adherence rates? Yes Adherence rates for STAR metric medications (List medication(s)/day supply/ last 2 fill dates). Adherence rates for medications indicated for disease state being reviewed (List medication(s)/day supply/ last 2 fill dates). Does the patient have >5 day gap between last estimated fill dates for any of the above medications or other medication gaps? No Reason for medication gaps.   Disease State Questions:  Able to connect with Patient? Yes Did patient have any problems with their health recently? No Note problems and Concerns: Have you had any admissions or emergency  room visits or worsening of your condition(s) since last visit? No Details of ED visit, hospital visit and/or worsening condition(s): Have you had any visits with new specialists or providers since your last visit?  No Explain: Have you had any new health care problem(s) since your last visit? No New problem(s) reported: Have you run out of any of your medications since you last spoke with clinical pharmacist? No What caused you to run out of your medications? Are there any medications you are not taking as prescribed? No What kept you from taking your medications as prescribed? Are you having any issues or side effects with your medications? No Note of issues or side effects: Do you have any other health concerns or questions you want to discuss with your Clinical Pharmacist before your next visit? No Note additional concerns and questions from Patient. Are there any health concerns that you feel we can do a better job addressing? No Note Patient's response. Are you having any problems with any of the following since the last visit: (select all that apply)  None  Details: 12. Any falls since last visit? No  Details: 13. Any increased or uncontrolled pain since last visit? No  Details: 14. Next visit Type: telephone       Visit with: Nurse (AWV)        Date: 02/10/22        Time: 9:00am  15. Additional Details? No     Care Gaps   AWV: done 03/22/21 Colonoscopy: done 03/05/19 DM Eye Exam: N/A DM Foot Exam: N/A Microalbumin: never HbgAIC: done 02/24/21 (6.0) DEXA: N/A Mammogram: N/A   Star Rating Drugs: Lisinopril (ZESTRIL) 20 MG tablet - last filled 08/29/21 90 days  Atorvastatin (LIPITOR) 20 MG tablet - last filled 08/29/21 90 days  Semaglutide 1 MG/DOSE, 4 MG/3ML SOPN - last filled 05/23/21 28 days (PAP?)   Future Appointments  Date Time Provider [place]  11/17/2021  9:00 AM BSFM-CCM PHARMACIST [facility]  02/10/2022  9:00 AM BSFM-NURSE HEALTH ADVISOR [facility]  06/23/2022  1:00 PM Patwardhan, Reynold Bowen, MD [facility] None      Jobe Gibbon, [place] Clinical Pharmacist Assistant  [phone]

## 2021-11-11 ENCOUNTER — Ambulatory Visit (INDEPENDENT_AMBULATORY_CARE_PROVIDER_SITE_OTHER): Payer: Medicare Other | Admitting: Family Medicine

## 2021-11-11 VITALS — BP 118/66 | HR 65 | Temp 97.8°F | Ht 70.0 in | Wt 341.0 lb

## 2021-11-11 DIAGNOSIS — I5189 Other ill-defined heart diseases: Secondary | ICD-10-CM

## 2021-11-11 DIAGNOSIS — E782 Mixed hyperlipidemia: Secondary | ICD-10-CM | POA: Diagnosis not present

## 2021-11-11 DIAGNOSIS — E161 Other hypoglycemia: Secondary | ICD-10-CM | POA: Diagnosis not present

## 2021-11-11 DIAGNOSIS — I10 Essential (primary) hypertension: Secondary | ICD-10-CM

## 2021-11-11 NOTE — Progress Notes (Signed)
Subjective:    Patient ID: James Branch, male    DOB: [birth year], 66 y.o.   MRN: [id number]  Patient is a very pleasant 66 year old Caucasian gentleman here today to discuss the RSV vaccine.  He has a history of emphysema.  He quit smoking in November 2021.  However he still takes Breo history for COPD.  Today on examination, his lung sounds are clear.  He has some faint upper airway breath sounds and slight wheezing but overall his lungs sound clear and much better than previous.  He denies any chest pain or shortness of breath.  He had a CT scan in January that showed no lung cancer. Past Medical History:  Diagnosis Date   Arthritis    knees   Asthma    CAD (coronary artery disease)    COPD (chronic obstructive pulmonary disease) (HCC)    Diastolic dysfunction    GERD (gastroesophageal reflux disease)    Hyperlipidemia    Hypertension    Obesity    OSA on CPAP    ahi-84, 18 CWP   Sleep apnea    wears cpap    Smoker    Umbilical hernia    Past Surgical History:  Procedure Laterality Date   CARDIAC CATHETERIZATION     COLONOSCOPY  2007   INSERTION OF MESH N/A 12/16/2020   Procedure: INSERTION OF MESH;  Surgeon: Coralie Keens, MD;  Location: [facility];  Service: General;  Laterality: N/A;   LEFT HEART CATH AND CORONARY ANGIOGRAPHY N/A 10/12/2020   Procedure: LEFT HEART CATH AND CORONARY ANGIOGRAPHY;  Surgeon: [name], MD;  Location: [facility];  Service: Cardiovascular;  Laterality: N/A;   TONSILLECTOMY  8144   UMBILICAL HERNIA REPAIR N/A 12/16/2020   Procedure: UMBILICAL HERNIA REPAIR WITH MESH;  Surgeon: Coralie Keens, MD;  Location: [facility];  Service: General;  Laterality: N/A;  LMA     Allergies  Allergen Reactions   Penicillins Swelling   Social History   Socioeconomic History   Marital status: Married    Spouse name: [name]   Number of children: 4   Years of education: Not on file   Highest education level: Not on file  Occupational History    Occupation: Retired  Tobacco Use   Smoking status: Former    Packs/day: 0.50    Years: 20.00    Total pack years: 10.00    Types: Cigarettes    Quit date: 02/17/2019    Years since quitting: 2.7   Smokeless tobacco: Never  Vaping Use   Vaping Use: Never used  Substance and Sexual Activity   Alcohol use: No    Alcohol/week: 0.0 standard drinks of alcohol   Drug use: No   Sexual activity: Yes    Birth control/protection: Condom  Other Topics Concern   Not on file  Social History Narrative   4 children, 18 grandchildren.   Social Determinants of Health   Financial Resource Strain: Low Risk  (02/10/2021)   Overall Financial Resource Strain (CARDIA)    Difficulty of Paying Living Expenses: Not hard at all  Food Insecurity: No Food Insecurity (02/10/2021)   Hunger Vital Sign    Worried About Running Out of Food in the Last Year: Never true    Ran Out of Food in the Last Year: Never true  Transportation Needs: No Transportation Needs (02/10/2021)   PRAPARE - Hydrologist (Medical): No    Lack of Transportation (Non-Medical): No  Physical Activity: Insufficiently Active (02/10/2021)   Exercise Vital Sign    Days of Exercise per Week: 5 days    Minutes of Exercise per Session: 10 min  Stress: No Stress Concern Present (02/10/2021)   [place]    Feeling of Stress : Not at all  Social Connections: Moderately Isolated (02/10/2021)   Social Connection and Isolation Panel [NHANES]    Frequency of Communication with Friends and Family: More than three times a week    Frequency of Social Gatherings with Friends and Family: More than three times a week    Attends Religious Services: Never    Marine scientist or Organizations: No    Attends Archivist Meetings: Never    Marital Status: Married  Human resources officer Violence: Not At Risk (02/10/2021)   Humiliation, Afraid,  Rape, and Kick questionnaire    Fear of Current or Ex-Partner: No    Emotionally Abused: No    Physically Abused: No    Sexually Abused: No      Review of Systems  All other systems reviewed and are negative.      Objective:   Physical Exam Vitals reviewed.  Constitutional:      General: He is not in acute distress.    Appearance: Normal appearance. He is obese. He is not ill-appearing or toxic-appearing.  Cardiovascular:     Rate and Rhythm: Normal rate and regular rhythm.     Heart sounds: Normal heart sounds. No murmur heard. Pulmonary:     Effort: Pulmonary effort is normal. No accessory muscle usage or respiratory distress.     Breath sounds: Decreased breath sounds present. No wheezing, rhonchi or rales.  Abdominal:     General: Bowel sounds are normal. There is no distension.     Palpations: Abdomen is soft.  Musculoskeletal:     Right lower leg: No edema.     Left lower leg: No edema.  Neurological:     Mental Status: He is alert.           Assessment & Plan:  Essential hypertension - Plan: Hemoglobin A1c, Lipid panel, COMPLETE METABOLIC PANEL WITH GFR  Diastolic dysfunction - Plan: Hemoglobin A1c, Lipid panel, COMPLETE METABOLIC PANEL WITH GFR  Morbid obesity ([place]) - Plan: Hemoglobin A1c, Lipid panel, COMPLETE METABOLIC PANEL WITH GFR I would encourage the patient to stay on breast tree twice daily.  I congratulated him on refraining from smoking.  I would recommend a CT scan of the lungs annually.  He is due again in January.  I do believe that the RSV vaccine will be a good option for him given his underlying history of COPD and his age.  Blood pressure today is excellent.  Check CMP, lipid panel, and A1c.  Goal LDL cholesterol is less than 70.

## 2021-11-12 LAB — COMPLETE METABOLIC PANEL WITH GFR
AG Ratio: 1.4 (calc) (ref 1.0–2.5)
ALT: 18 U/L (ref 9–46)
AST: 13 U/L (ref 10–35)
Albumin: 3.9 g/dL (ref 3.6–5.1)
Alkaline phosphatase (APISO): 78 U/L (ref 35–144)
BUN: 14 mg/dL (ref 7–25)
CO2: 23 mmol/L (ref 20–32)
Calcium: 9.1 mg/dL (ref 8.6–10.3)
Chloride: 105 mmol/L (ref 98–110)
Creat: 1.01 mg/dL (ref 0.70–1.35)
Globulin: 2.7 g/dL (calc) (ref 1.9–3.7)
Glucose, Bld: 108 mg/dL — ABNORMAL HIGH (ref 65–99)
Potassium: 4.2 mmol/L (ref 3.5–5.3)
Sodium: 139 mmol/L (ref 135–146)
Total Bilirubin: 0.7 mg/dL (ref 0.2–1.2)
Total Protein: 6.6 g/dL (ref 6.1–8.1)
eGFR: 81 mL/min/{1.73_m2} (ref >=60)

## 2021-11-12 LAB — LIPID PANEL
Cholesterol: 132 mg/dL (ref <200)
HDL: 51 mg/dL (ref >=40)
LDL Cholesterol (Calc): 64 mg/dL (calc)
Non-HDL Cholesterol (Calc): 81 mg/dL (calc) (ref <130)
Total CHOL/HDL Ratio: 2.6 (calc) (ref <5.0)
Triglycerides: 91 mg/dL (ref <150)

## 2021-11-12 LAB — HEMOGLOBIN A1C
Hgb A1c MFr Bld: 5.6 % of total Hgb (ref <5.7)
Mean Plasma Glucose: 114 mg/dL
eAG (mmol/L): 6.3 mmol/L

## 2021-11-15 NOTE — Progress Notes (Unsigned)
Chronic Care Management Pharmacy Note  11/17/2021 Name:  James Branch MRN:  [id number] DOB:  March 09, 1956  Summary: PharmD FU visit.  Patient doing well overall, he has had to stop Ozempic due to diarrhea.  Most recent labs at FU are all WNL.  Recommendations/Changes made from today's visit: None  Plan: FU 1 year   Subjective: TOA MIA is an 66 y.o. year old male who is a primary patient of [name], [name], MD.  The CCM team was consulted for assistance with disease management and care coordination needs.    Engaged with patient by telephone for follow up visit in response to provider referral for pharmacy case management and/or care coordination services.   Consent to Services:  The patient was given the following information about Chronic Care Management services today, agreed to services, and gave verbal consent: 1. CCM service includes personalized support from designated clinical staff supervised by the primary care provider, including individualized plan of care and coordination with other care providers 2. 24/7 contact phone numbers for assistance for urgent and routine care needs. 3. Service will only be billed when office clinical staff spend 20 minutes or more in a month to coordinate care. 4. Only one practitioner may furnish and bill the service in a calendar month. 5.The patient may stop CCM services at any time (effective at the end of the month) by phone call to the office staff. 6. The patient will be responsible for cost sharing (co-pay) of up to 20% of the service fee (after annual deductible is met). Patient agreed to services and consent obtained.  Patient Care Team: [name], MD as PCP - General (Family Medicine) Edythe Clarity, [facility] as Pharmacist (Pharmacist)  Recent office visits:  None noted.    Recent consult visits:  None noted.    Hospital visits: 07/13/21 Medication Reconciliation was completed by comparing discharge summary,  patient's EMR and Pharmacy list, and upon discussion with patient.   Admitted to the hospital on 07/13/21 due to Dysuria. Discharge date was 07/13/21. Discharged from [facility].   New?Medications Started at [facility] Discharge:?? None noted.    Medication Changes at Hospital Discharge: None noted.    Medications Discontinued at Hospital Discharge: None noted.    Medications that remain the same after Hospital Discharge:??  All other medications will remain the same.     Hospital visits:  Medication Reconciliation was completed by comparing discharge summary, patient's EMR and Pharmacy list, and upon discussion with patient.   Admitted to the hospital on 07/10/21 due to Dysuria. Discharge date was 07/10/21. Discharged from [facility].    New?Medications Started at [facility] Discharge:?? None noted.    Medication Changes at Hospital Discharge: None noted.    Medications Discontinued at Hospital Discharge: None noted   Medications that remain the same after Hospital Discharge:??  All other medications will remain the same.    Objective:  Lab Results  Component Value Date   CREATININE 1.01 11/11/2021   BUN 14 11/11/2021   EGFR 81 11/11/2021   GFRNONAA >60 12/08/2020   GFRAA 94 02/02/2020   NA 139 11/11/2021   K 4.2 11/11/2021   CALCIUM 9.1 11/11/2021   CO2 23 11/11/2021   GLUCOSE 108 (H) 11/11/2021    Lab Results  Component Value Date/Time   HGBA1C 5.6 11/11/2021 09:19 AM   HGBA1C 6.0 (H) 02/24/2021 04:10 PM    Last diabetic Eye exam: No results found for: "HMDIABEYEEXA"  Last diabetic  Foot exam: No results found for: "HMDIABFOOTEX"   Lab Results  Component Value Date   CHOL 132 11/11/2021   HDL 51 11/11/2021   LDLCALC 64 11/11/2021   TRIG 91 11/11/2021   CHOLHDL 2.6 11/11/2021       Latest Ref Rng & Units 11/11/2021    9:19 AM 02/28/2021    8:43 AM 02/24/2021    4:10 PM  Hepatic Function  Total Protein 6.1 - 8.1 g/dL 6.6  7.3  7.2   AST 10 - 35  U/L _0 ALT 9 - 46 U/L 18  33  28   Total Bilirubin 0.2 - 1.2 mg/dL 0.7  0.8  0.7     No results found for: "TSH", "FREET4"     Latest Ref Rng & Units 02/28/2021    8:43 AM 02/24/2021    4:10 PM 12/08/2020    8:21 AM  CBC  WBC 3.8 - 10.8 Thousand/uL 6.7  8.4  7.1   Hemoglobin 13.2 - 17.1 g/dL 15.7  15.7  15.2   Hematocrit 38.5 - 50.0 % 47.3  45.6  45.5   Platelets 140 - 400 Thousand/uL 266  257  227     No results found for: "VD25OH"  Clinical ASCVD: No  The 10-year ASCVD risk score (Arnett DK, et al., 2019) is: 12.5%   Values used to calculate the score:     Age: 76 years     Sex: Male     Is Non-Hispanic African American: No     Diabetic: No     Tobacco smoker: No     Systolic Blood Pressure: 811 mmHg     Is BP treated: Yes     HDL Cholesterol: 51 mg/dL     Total Cholesterol: 132 mg/dL       03/22/2021   12:11 PM 02/10/2021    9:03 AM 04/08/2020   12:11 PM  Depression screen PHQ 2/9  Decreased Interest 0 0 0  Down, Depressed, Hopeless 0 0 0  PHQ - 2 Score 0 0 0  Altered sleeping 0    Tired, decreased energy 2    Change in appetite 0    Feeling bad or failure about yourself  0    Trouble concentrating 0    Moving slowly or fidgety/restless 0    Suicidal thoughts 0    PHQ-9 Score 2    Difficult doing work/chores Not difficult at all        Social History   Tobacco Use  Smoking Status Former   Packs/day: 0.50   Years: 20.00   Total pack years: 10.00   Types: Cigarettes   Quit date: 02/17/2019   Years since quitting: 2.7  Smokeless Tobacco Never   BP Readings from Last 3 Encounters:  11/11/21 118/66  07/14/21 (!) 130/56  07/13/21 127/89   Pulse Readings from Last 3 Encounters:  11/11/21 65  07/14/21 65  07/13/21 63   Wt Readings from Last 3 Encounters:  11/11/21 (!) 341 lb (154.7 kg)  07/14/21 (!) 331 lb 9.6 oz (150.4 kg)  06/22/21 (!) 330 lb (149.7 kg)   BMI Readings from Last 3 Encounters:  11/11/21 48.93 kg/m  07/14/21 47.58  kg/m  06/22/21 47.35 kg/m    Assessment/Interventions: Review of patient past medical history, allergies, medications, health status, including review of consultants reports, laboratory and other test data, was performed as part of comprehensive evaluation and provision of chronic care management services.  SDOH:  (Social Determinants of Health) assessments and interventions performed: No  Financial Resource Strain: Low Risk  (02/10/2021)   Overall Financial Resource Strain (CARDIA)    Difficulty of Paying Living Expenses: Not hard at all    SDOH Screenings   Alcohol Screen: Low Risk  (03/22/2021)   Alcohol Screen    Last Alcohol Screening Score (AUDIT): 0  Depression (PHQ2-9): Low Risk  (03/22/2021)   Depression (PHQ2-9)    PHQ-2 Score: 2  Financial Resource Strain: Low Risk  (02/10/2021)   Overall Financial Resource Strain (CARDIA)    Difficulty of Paying Living Expenses: Not hard at all  Food Insecurity: No Food Insecurity (02/10/2021)   Hunger Vital Sign    Worried About Running Out of Food in the Last Year: Never true    Ran Out of Food in the Last Year: Never true  Housing: Low Risk  (02/10/2021)   Housing    Last Housing Risk Score: 0  Physical Activity: Insufficiently Active (02/10/2021)   Exercise Vital Sign    Days of Exercise per Week: 5 days    Minutes of Exercise per Session: 10 min  Social Connections: Moderately Isolated (02/10/2021)   Social Connection and Isolation Panel [NHANES]    Frequency of Communication with Friends and Family: More than three times a week    Frequency of Social Gatherings with Friends and Family: More than three times a week    Attends Religious Services: Never    Marine scientist or Organizations: No    Attends Archivist Meetings: Never    Marital Status: Married  Stress: No Stress Concern Present (02/10/2021)   [place]    Feeling of Stress :  Not at all  Tobacco Use: Medium Risk (07/13/2021)   Patient History    Smoking Tobacco Use: Former    Smokeless Tobacco Use: Never    Passive Exposure: Not on file  Transportation Needs: No Transportation Needs (02/10/2021)   PRAPARE - Hydrologist (Medical): No    Lack of Transportation (Non-Medical): No    CCM Care Plan  Allergies  Allergen Reactions   Penicillins Swelling    Medications Reviewed Today     Reviewed by Edythe Clarity, [facility] (Pharmacist) on 11/17/21 at 25  Med List Status: <None>   Medication Order Taking? Sig Documenting Provider Last Dose Status Informant  atorvastatin (LIPITOR) 20 MG tablet [id number] Yes TAKE ONE TABLET BY MOUTH ONCE DAILY [name], MD Taking Active   Budeson-Glycopyrrol-Formoterol (BREZTRI AEROSPHERE) 160-9-4.8 MCG/ACT Hollie Salk [id number] Yes Inhale 2 puffs into the lungs 2 (two) times daily. [name], MD Taking Active   furosemide (LASIX) 40 MG tablet [id number] Yes TAKE ONE TABLET BY MOUTH ONCE DAILY [name], MD Taking Active   lisinopril (ZESTRIL) 20 MG tablet [id number] Yes Take 1 tablet by mouth daily. [provider] Taking Active   metoprolol tartrate (LOPRESSOR) 25 MG tablet [id number] Yes Take 0.5 tablets (12.5 mg total) by mouth 2 (two) times daily. Patwardhan, Reynold Bowen, MD Taking Active   nitroGLYCERIN (NITROSTAT) 0.4 MG SL tablet [id number] Yes Place 0.4 mg under the tongue every 5 (five) minutes as needed for chest pain. [provider] Taking Active Self           Med Note Fenton Foy Dec 16, 2020  8:24 AM) Never used  ondansetron (ZOFRAN) 4 MG tablet [id number] Yes Take  1 tablet (4 mg total) by mouth every 8 (eight) hours as needed for nausea or vomiting. Perlie Mayo, NP Taking Active   pantoprazole (PROTONIX) 40 MG tablet [id number] Yes Take 1 tablet (40 mg total) by mouth daily. [name], MD Taking Active   Semaglutide, 1 MG/DOSE, 4 MG/3ML  SOPN [id number] Yes Inject 1 mg as directed once a week. 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   Abnormal stress test 07/05/2020   Upper respiratory tract infection 04/02/2020   Exertional dyspnea 09/05/2019   Mixed hyperlipidemia 02/20/2019   Coronary artery calcification    Bilateral sensorineural hearing loss 01/15/2017   Morbid obesity ([place])    Smoker    OSA on CPAP    Diastolic dysfunction     Immunization History  Administered Date(s) Administered   Fluad Quad(high Dose 65+) 01/26/2021   Influenza,inj,Quad PF,6+ Mos 03/14/2017, 03/04/2018, 01/31/2019, 02/05/2020   Influenza-Unspecified 02/28/2016, 02/24/2017   PFIZER(Purple Top)SARS-COV-2 Vaccination 05/23/2019, 06/17/2019, 01/22/2020, 10/29/2020   PNEUMOCOCCAL CONJUGATE-20 01/26/2021   Pfizer Covid-19 Vaccine Bivalent Booster 56yr & up 02/10/2021   Pneumococcal Polysaccharide-23 01/31/2019   Tdap 10/26/2014   Zoster Recombinat (Shingrix) 03/04/2021, 06/10/2021   Zoster, Live 03/28/2016    Conditions to be addressed/monitored:  HTN, HLD, Obesity  Care Plan : General Pharmacy (Adult)  Updates made by DEdythe Clarity RPH since 11/17/2021 12:00 AM     Problem: HTN, HLD, Obesity   Priority: High  Onset Date: 11/17/2021     Long-Range Goal: Patient-Specific Goal   Start Date: 11/17/2021  Expected End Date: 05/20/2022  This Visit's Progress: On track  Priority: High  Note:    Current Barriers:  None at this time  Pharmacist Clinical Goal(s):  Patient will achieve improvement in weight as evidenced by monitoring through collaboration with PharmD and provider.   Interventions: 1:1 collaboration with PSusy Frizzle MD regarding development and update of comprehensive plan of care as evidenced by provider attestation and co-signature Inter-disciplinary care team collaboration  (see longitudinal plan of care) Comprehensive medication review performed; medication list updated in electronic medical record  Hypertension (BP goal <140/90) -Controlled -Current treatment: Metoprolol tartrate 12.568mtwice daily Appropriate, Effective, Safe, Accessible Lisinopril 2022maily  Appropriate, Effective, Safe, Accessible -Medications previously tried: none noted  -Current home readings: 118/62 -Current dietary habits: same see previous -Current exercise habits: goes on walks twice daily almost every day -Denies hypotensive/hypertensive symptoms -Educated on BP goals and benefits of medications for prevention of heart attack, stroke and kidney damage; Importance of home blood pressure monitoring; Symptoms of hypotension and importance of maintaining adequate hydration; -Counseled to monitor BP at home a few times per week, document, and provide log at future appointments -Recommended to continue current medication No changes needed at this time  Hyperlipidemia: (LDL goal < 100) -Controlled -Current treatment: Atorvastatin 22m76mpropriate, Effective, Safe, Accessible -Medications previously tried: none noted  -Current dietary patterns: see previous -Current exercise habits: see HTN -Educated on Cholesterol goals;  Benefits of statin for ASCVD risk reduction; Importance of limiting foods high in cholesterol; -Recommended to continue current medication Most recent LDL well controlled, no changes needed - continue routine screenings.  Obesity (Goal: Continue gradual weight loss) -Not ideally controlled -Current treatment  None -Medications previously tried: Ozempic (had to stop due to diarrhea)  -Recommended to  continue current medication Continue to work on lifestyle mods.  He had nausea to higher doses of Ozempic so we could potentially re introduce the lower strengths and stay at 0.34m if he ever wanted to try again.  Patient Goals/Self-Care Activities Patient  will:  - take medications as prescribed as evidenced by patient report and record review check blood pressure a few times per week, document, and provide at future appointments  Follow Up Plan: The care management team will reach out to the patient again over the next 365 days.        Medication Assistance: None required.  Patient affirms current coverage meets needs.  Compliance/Adherence/Medication fill history: Care Gaps: None  Star-Rating Drugs: Atorvastatin 271m05/15/23 90ds  Patient's preferred pharmacy is:  [facility] [place] [address] [id number]: [phone]: [address]hone: [phone]: [phone] pill box? No - [facility] delivers in vials Pt endorses 100% compliance  We discussed: Benefits of medication synchronization, packaging and delivery as well as enhanced pharmacist oversight with [facility]. Patient decided to: Utilize [facility] pharmacy for medication synchronization, packaging and delivery  Care Plan and Follow Up Patient Decision:  Patient agrees to Care Plan and Follow-up.  Plan: The care management team will reach out to the patient again over the next 365 days.  ChBeverly MilchPharmD, CPP Clinical Pharmacist Practitioner [phone]

## 2021-11-17 ENCOUNTER — Ambulatory Visit (INDEPENDENT_AMBULATORY_CARE_PROVIDER_SITE_OTHER): Payer: Medicare Other | Admitting: Pharmacist

## 2021-11-17 DIAGNOSIS — I10 Essential (primary) hypertension: Secondary | ICD-10-CM

## 2021-11-17 DIAGNOSIS — E782 Mixed hyperlipidemia: Secondary | ICD-10-CM

## 2021-11-17 NOTE — Patient Instructions (Addendum)
Visit Information   Goals Addressed             This Visit's Progress    Track and Manage My Blood Pressure-Hypertension       Timeframe:  Long-Range Goal Priority:  High Start Date:   11/17/21                          Expected End Date:   05/20/22                    Follow Up Date 02/17/22    - check blood pressure weekly - choose a place to take my blood pressure (home, clinic or office, retail store) - write blood pressure results in a log or diary    Why is this important?   You won't feel high blood pressure, but it can still hurt your blood vessels.  High blood pressure can cause heart or kidney problems. It can also cause a stroke.  Making lifestyle changes like losing a little weight or eating less salt will help.  Checking your blood pressure at home and at different times of the day can help to control blood pressure.  If the doctor prescribes medicine remember to take it the way the doctor ordered.  Call the office if you cannot afford the medicine or if there are questions about it.     Notes:        Patient Care Plan: General Pharmacy (Adult)     Problem Identified: HTN, HLD, Obesity   Priority: High  Onset Date: 11/17/2021     Long-Range Goal: Patient-Specific Goal   Start Date: 11/17/2021  Expected End Date: 05/20/2022  This Visit's Progress: On track  Priority: High  Note:    Current Barriers:  None at this time  Pharmacist Clinical Goal(s):  Patient will achieve improvement in weight as evidenced by monitoring through collaboration with PharmD and provider.   Interventions: 1:1 collaboration with [name], MD regarding development and update of comprehensive plan of care as evidenced by provider attestation and co-signature Inter-disciplinary care team collaboration (see longitudinal plan of care) Comprehensive medication review performed; medication list updated in electronic medical record  Hypertension (BP goal  <140/90) -Controlled -Current treatment: Metoprolol tartrate 12.'5mg'$  twice daily Appropriate, Effective, Safe, Accessible Lisinopril '20mg'$  daily  Appropriate, Effective, Safe, Accessible -Medications previously tried: none noted  -Current home readings: 118/62 -Current dietary habits: same see previous -Current exercise habits: goes on walks twice daily almost every day -Denies hypotensive/hypertensive symptoms -Educated on BP goals and benefits of medications for prevention of heart attack, stroke and kidney damage; Importance of home blood pressure monitoring; Symptoms of hypotension and importance of maintaining adequate hydration; -Counseled to monitor BP at home a few times per week, document, and provide log at future appointments -Recommended to continue current medication No changes needed at this time  Hyperlipidemia: (LDL goal < 100) -Controlled -Current treatment: Atorvastatin '20mg'$  Appropriate, Effective, Safe, Accessible -Medications previously tried: none noted  -Current dietary patterns: see previous -Current exercise habits: see HTN -Educated on Cholesterol goals;  Benefits of statin for ASCVD risk reduction; Importance of limiting foods high in cholesterol; -Recommended to continue current medication Most recent LDL well controlled, no changes needed - continue routine screenings.  Obesity (Goal: Continue gradual weight loss) -Not ideally controlled -Current treatment  None -Medications previously tried: Ozempic (had to stop due to diarrhea)  -Recommended to continue current medication Continue to work on  lifestyle mods.  He had nausea to higher doses of Ozempic so we could potentially re introduce the lower strengths and stay at 0.'5mg'$  if he ever wanted to try again.  Patient Goals/Self-Care Activities Patient will:  - take medications as prescribed as evidenced by patient report and record review check blood pressure a few times per week, document, and provide  at future appointments  Follow Up Plan: The care management team will reach out to the patient again over the next 365 days.        The patient verbalized understanding of instructions, educational materials, and care plan provided today and DECLINED offer to receive copy of patient instructions, educational materials, and care plan.  Telephone follow up appointment with pharmacy team member scheduled for: 1 year  Edythe Clarity, [place], PharmD, [place] Clinical Pharmacist Practitioner [place] [phone]

## 2021-11-22 ENCOUNTER — Telehealth: Payer: Self-pay | Admitting: Pharmacist

## 2021-11-22 NOTE — Progress Notes (Signed)
    Chronic Care Management Pharmacy Assistant   Name: James Branch  MRN: [id number] DOB: 1955/09/13   Reason for Encounter: Medication Coordination Call    Recent office visits:  None  Recent consult visits:  None  Hospital visits:  None in previous 6 months  Medications: Outpatient Encounter Medications as of 11/22/2021  Medication Sig Note   atorvastatin (LIPITOR) 20 MG tablet TAKE ONE TABLET BY MOUTH ONCE DAILY    Budeson-Glycopyrrol-Formoterol (BREZTRI AEROSPHERE) 160-9-4.8 MCG/ACT AERO Inhale 2 puffs into the lungs 2 (two) times daily.    furosemide (LASIX) 40 MG tablet TAKE ONE TABLET BY MOUTH ONCE DAILY    lisinopril (ZESTRIL) 20 MG tablet Take 1 tablet by mouth daily.    metoprolol tartrate (LOPRESSOR) 25 MG tablet Take 0.5 tablets (12.5 mg total) by mouth 2 (two) times daily.    nitroGLYCERIN (NITROSTAT) 0.4 MG SL tablet Place 0.4 mg under the tongue every 5 (five) minutes as needed for chest pain. 12/16/2020: Never used   ondansetron (ZOFRAN) 4 MG tablet Take 1 tablet (4 mg total) by mouth every 8 (eight) hours as needed for nausea or vomiting.    pantoprazole (PROTONIX) 40 MG tablet Take 1 tablet (40 mg total) by mouth daily.    Semaglutide, 1 MG/DOSE, 4 MG/3ML SOPN Inject 1 mg as directed once a week.    No facility-administered encounter medications on file as of 11/22/2021.   Reviewed chart for medication changes ahead of medication coordination call.  BP Readings from Last 3 Encounters:  11/11/21 118/66  07/14/21 (!) 130/56  07/13/21 127/89    Lab Results  Component Value Date   HGBA1C 5.6 11/11/2021     Patient obtains medications through Vials  90 Days   Last adherence delivery included: Atorvastatin 20 mg daily Furosemide 40 mg daily Metoprolol tartrate 25 mg twice daily  Lisinopril 20 mg daily Pantoprazole 40 mg daily Breztri Inhaler 2 puffs twice a day   Patient is due for next adherence delivery on: 12/01/2021. Called patient and reviewed  medications and coordinated delivery.  This delivery to include: Atorvastatin 20 mg daily Furosemide 40 mg daily Metoprolol tartrate 25 mg twice daily  Lisinopril 20 mg daily Pantoprazole 40 mg daily  Patient needs refills for Lisinopril. -Rx refill request sent.  Confirmed delivery date of 12/01/2021, advised patient that pharmacy will contact them the morning of delivery.  Care Gaps: Medicare Annual Wellness: Completed 03/22/2021 Hemoglobin A1C: 6.0% on 11/11/2021 Colonoscopy: Completed 03/05/2019  Future Appointments  Date Time Provider [place]  02/10/2022  9:00 AM BSFM-NURSE HEALTH ADVISOR [facility] P[facility]  06/23/2022  1:00 PM Patwardhan, Reynold Bowen, MD [facility]-PC[facility] None  11/09/2022  2:00 PM BSFM-CCM PHARMACIST [facility]   April D Calhoun, [place] Pharmacist Assistant [phone]

## 2021-11-28 ENCOUNTER — Other Ambulatory Visit: Payer: Self-pay | Admitting: Family Medicine

## 2021-12-02 ENCOUNTER — Encounter: Payer: Self-pay | Admitting: Family Medicine

## 2021-12-15 DIAGNOSIS — I10 Essential (primary) hypertension: Secondary | ICD-10-CM | POA: Diagnosis not present

## 2021-12-15 DIAGNOSIS — E785 Hyperlipidemia, unspecified: Secondary | ICD-10-CM

## 2021-12-22 ENCOUNTER — Other Ambulatory Visit (HOSPITAL_BASED_OUTPATIENT_CLINIC_OR_DEPARTMENT_OTHER): Payer: Self-pay

## 2021-12-22 DIAGNOSIS — D23112 Other benign neoplasm of skin of right lower eyelid, including canthus: Secondary | ICD-10-CM | POA: Diagnosis not present

## 2021-12-22 DIAGNOSIS — H2513 Age-related nuclear cataract, bilateral: Secondary | ICD-10-CM | POA: Diagnosis not present

## 2021-12-22 MED ORDER — AREXVY 120 MCG/0.5ML IM SUSR
INTRAMUSCULAR | 0 refills | Status: DC
  Filled 2021-12-22: qty 0.5, 1d supply, fill #0

## 2022-01-20 ENCOUNTER — Ambulatory Visit (INDEPENDENT_AMBULATORY_CARE_PROVIDER_SITE_OTHER): Payer: Medicare Other

## 2022-01-20 DIAGNOSIS — Z23 Encounter for immunization: Secondary | ICD-10-CM | POA: Diagnosis not present

## 2022-02-08 ENCOUNTER — Other Ambulatory Visit (HOSPITAL_BASED_OUTPATIENT_CLINIC_OR_DEPARTMENT_OTHER): Payer: Self-pay

## 2022-02-08 MED ORDER — COMIRNATY 30 MCG/0.3ML IM SUSY
PREFILLED_SYRINGE | INTRAMUSCULAR | 0 refills | Status: DC
  Filled 2022-02-08: qty 0.3, 1d supply, fill #0

## 2022-02-17 ENCOUNTER — Other Ambulatory Visit: Payer: Self-pay | Admitting: Cardiology

## 2022-02-21 ENCOUNTER — Telehealth: Payer: Self-pay | Admitting: Pharmacist

## 2022-02-21 NOTE — Progress Notes (Signed)
    Chronic Care Management Pharmacy Assistant   Name: James Branch  MRN: [id number] DOB: 07/31/1955   Reason for Encounter: Monthly Medication Coordination Call     Medications: Outpatient Encounter Medications as of 02/21/2022  Medication Sig Note   atorvastatin (LIPITOR) 20 MG tablet TAKE ONE TABLET BY MOUTH ONCE DAILY    Budeson-Glycopyrrol-Formoterol (BREZTRI AEROSPHERE) 160-9-4.8 MCG/ACT AERO Inhale 2 puffs into the lungs 2 (two) times daily.    COVID-19 mRNA vaccine 2023-2024 (COMIRNATY) syringe Inject into the muscle.    furosemide (LASIX) 40 MG tablet TAKE ONE TABLET BY MOUTH ONCE DAILY    lisinopril (ZESTRIL) 20 MG tablet TAKE ONE TABLET BY MOUTH ONCE DAILY    metoprolol tartrate (LOPRESSOR) 25 MG tablet TAKE 1/2 TABLET BY MOUTH TWICE DAILY    nitroGLYCERIN (NITROSTAT) 0.4 MG SL tablet Place 0.4 mg under the tongue every 5 (five) minutes as needed for chest pain. 12/16/2020: Never used   ondansetron (ZOFRAN) 4 MG tablet Take 1 tablet (4 mg total) by mouth every 8 (eight) hours as needed for nausea or vomiting.    pantoprazole (PROTONIX) 40 MG tablet Take 1 tablet (40 mg total) by mouth daily.    RSV vaccine recomb adjuvanted (AREXVY) 120 MCG/0.5ML injection Inject into the muscle.    Semaglutide, 1 MG/DOSE, 4 MG/3ML SOPN Inject 1 mg as directed once a week.    No facility-administered encounter medications on file as of 02/21/2022.    Reviewed chart for medication changes ahead of medication coordination call.  No OVs, Consults, or hospital visits since last care coordination call/Pharmacist visit. (If appropriate, list visit date, provider name)  No medication changes indicated OR if recent visit, treatment plan here.  BP Readings from Last 3 Encounters:  11/11/21 118/66  07/14/21 (!) 130/56  07/13/21 127/89    Lab Results  Component Value Date   HGBA1C 5.6 11/11/2021     Patient obtains medications through Vials  90 Days   Last adherence delivery included:  (medication name and frequency)  Atorvastatin 20 mg daily Furosemide 40 mg daily Metoprolol tartrate 25 mg twice daily  Lisinopril 20 mg daily Pantoprazole 40 mg daily   Patient is due for next adherence delivery on: 03/02/22. Called patient and reviewed medications and coordinated delivery.   This delivery to include:  Atorvastatin 20 mg daily Furosemide 40 mg daily Metoprolol tartrate 25 mg twice daily  Lisinopril 20 mg daily Pantoprazole 40 mg daily Sildenafil 50 mg as directed Breztri inhaler 2 puffs twice daily   Patient needs refills from PCP Atorvastatin 20 mg daily Furosemide 40 mg daily Pantoprazole 40 mg daily  Specialist Metoprolol tartrate 25 mg twice daily  Sildenafil 50 mg as directed   Confirmed delivery date of 03/02/22, advised patient that pharmacy will contact them the morning of delivery.    Future Appointments  Date Time Provider [place]  03/17/2022  8:15 AM [facility]  03/23/2022  9:30 AM [name], MD [facility]  03/29/2022  2:10 PM BSFM-NURSE HEALTH ADVISOR [facility]  06/23/2022  1:00 PM Patwardhan, Reynold Bowen, MD [facility] None  11/09/2022  2:00 PM B[facility]-CCM PHARMACIST [facility]     Jobe Gibbon, [place] Clinical Pharmacist Assistant  [phone]

## 2022-02-23 ENCOUNTER — Other Ambulatory Visit: Payer: Self-pay | Admitting: Family Medicine

## 2022-02-23 NOTE — Telephone Encounter (Signed)
Unable to refill per protocol, Rx request is too soon, last refill 02/18/21  for 90 and 3 RF. Request is too soon. Will refuse.  Requested Prescriptions  Pending Prescriptions Disp Refills   atorvastatin (LIPITOR) 20 MG tablet [Pharmacy Med Name: atorvastatin 20 mg tablet] 90 tablet 3    Sig: TAKE ONE TABLET BY MOUTH ONCE DAILY     Cardiovascular:  Antilipid - Statins Failed - 02/23/2022  2:48 PM      Failed - Lipid Panel in normal range within the last 12 months    Cholesterol  Date Value Ref Range Status  11/11/2021 132 <200 mg/dL Final   LDL Cholesterol (Calc)  Date Value Ref Range Status  11/11/2021 64 mg/dL (calc) Final    Comment:    Reference range: <100 . Desirable range <100 mg/dL for primary prevention;   <70 mg/dL for patients with CHD or diabetic patients  with > or = 2 CHD risk factors. Marland Kitchen LDL-C is now calculated using the Martin-Hopkins  calculation, which is a validated novel method providing  better accuracy than the Friedewald equation in the  estimation of LDL-C.  Cresenciano Genre et al. Annamaria Helling. 4196;222(97): 2061-2068  (http://education.Quest[facility].com/faq/FAQ164)    HDL  Date Value Ref Range Status  11/11/2021 51 > OR = 40 mg/dL Final   Triglycerides  Date Value Ref Range Status  11/11/2021 91 <150 mg/dL Final         Passed - Patient is not pregnant      Passed - Valid encounter within last 12 months    Recent Outpatient Visits           7 months ago [place] Dennard Schaumann, [name], MD   8 months ago Diarrhea, unspecified type   [place] [name], MD   9 months ago Sore throat   [place] Eulogio Bear, NP   11 months ago Encounter for [employer] annual wellness exam   [place] [name], [name], MD   12 months ago Morbid obesity ([place])   [place] [name], [name], MD       Future Appointments             In 4 weeks [name],  [name], MD [place], [facility]   In 4 months Patwardhan, Reynold Bowen, MD [facility]             furosemide (LASIX) 40 MG tablet [Pharmacy Med Name: furosemide 40 mg tablet] 90 tablet 3    Sig: TAKE ONE TABLET BY MOUTH ONCE DAILY     Cardiovascular:  Diuretics - Loop Failed - 02/23/2022  2:48 PM      Failed - Mg Level in normal range and within 180 days    No results found for: "MG"       Failed - Valid encounter within last 6 months    Recent Outpatient Visits           7 months ago [place], Warren T, MD   8 months ago Diarrhea, unspecified type   [place] [name], MD   9 months ago Sore throat   [place] Eulogio Bear, NP   11 months ago Encounter for [employer] annual wellness exam   [place] [name], MD   12 months ago Morbid obesity ([place])  [place] [name], [name], MD       Future Appointments             In 4 weeks [name], [name], MD [place], P[facility]   In 4 months Patwardhan, Reynold Bowen, MD [facility]            Passed - K in normal range and within 180 days    Potassium  Date Value Ref Range Status  11/11/2021 4.2 3.5 - 5.3 mmol/L Final         Passed - Ca in normal range and within 180 days    Calcium  Date Value Ref Range Status  11/11/2021 9.1 8.6 - 10.3 mg/dL Final         Passed - Na in normal range and within 180 days    Sodium  Date Value Ref Range Status  11/11/2021 139 135 - 146 mmol/L Final  09/23/2020 141 134 - 144 mmol/L Final         Passed - Cr in normal range and within 180 days    Creat  Date Value Ref Range Status  11/11/2021 1.01 0.70 - 1.35 mg/dL Final         Passed - Cl in normal range and within 180 days    Chloride  Date Value Ref Range Status  11/11/2021 105 98 - 110 mmol/L Final         Passed - Last BP  in normal range    BP Readings from Last 1 Encounters:  11/11/21 118/66          pantoprazole (PROTONIX) 40 MG tablet [Pharmacy Med Name: pantoprazole 40 mg tablet,delayed release] 90 tablet 3    Sig: TAKE ONE TABLET BY MOUTH ONCE DAILY     Gastroenterology: Proton Pump Inhibitors Passed - 02/23/2022  2:48 PM      Passed - Valid encounter within last 12 months    Recent Outpatient Visits           7 months ago [place], Warren T, MD   8 months ago Diarrhea, unspecified type   [place], Warren T, MD   9 months ago Sore throat   [place] Eulogio Bear, NP   11 months ago Encounter for [employer] annual wellness exam   [place] [name], [name], MD   12 months ago Morbid obesity ([place])   [place] [name], [name], MD       Future Appointments             In 4 weeks [name], [name], MD [place], P[facility]   In 4 months Patwardhan, Reynold Bowen, MD [facility]

## 2022-02-24 ENCOUNTER — Other Ambulatory Visit: Payer: Self-pay | Admitting: Family Medicine

## 2022-02-24 MED ORDER — ATORVASTATIN CALCIUM 20 MG PO TABS: 20.0000 mg | ORAL_TABLET | Freq: Every day | ORAL | 0 refills | Status: DC

## 2022-02-24 MED ORDER — PANTOPRAZOLE SODIUM 40 MG PO TBEC: 40.0000 mg | DELAYED_RELEASE_TABLET | Freq: Every day | ORAL | 0 refills | Status: DC

## 2022-02-24 MED ORDER — FUROSEMIDE 40 MG PO TABS: 40.0000 mg | ORAL_TABLET | Freq: Every day | ORAL | 0 refills | Status: DC

## 2022-02-24 NOTE — Telephone Encounter (Signed)
-----   Message from Edythe Clarity, [facility] sent at 02/24/2022  8:23 AM EST ----- Hey!  Patient is due soon for a delivery from upstream.  They are requesting a 90 day rx for his atorvastatin, pantoprazole, and furosemide to be included in this delivery.  Thank you!  Beverly Milch, PharmD, CPP Clinical Pharmacist Practitioner [place] [phone]

## 2022-02-24 NOTE — Telephone Encounter (Signed)
Patient do for refill, will refill.  Requested Prescriptions  Pending Prescriptions Disp Refills   atorvastatin (LIPITOR) 20 MG tablet 90 tablet 0    Sig: Take 1 tablet (20 mg total) by mouth daily.     Cardiovascular:  Antilipid - Statins Failed - 02/24/2022 10:11 AM      Failed - Lipid Panel in normal range within the last 12 months    Cholesterol  Date Value Ref Range Status  11/11/2021 132 <200 mg/dL Final   LDL Cholesterol (Calc)  Date Value Ref Range Status  11/11/2021 64 mg/dL (calc) Final    Comment:    Reference range: <100 . Desirable range <100 mg/dL for primary prevention;   <70 mg/dL for patients with CHD or diabetic patients  with > or = 2 CHD risk factors. Marland Kitchen LDL-C is now calculated using the Martin-Hopkins  calculation, which is a validated novel method providing  better accuracy than the Friedewald equation in the  estimation of LDL-C.  Cresenciano Genre et al. Annamaria Helling. 3825;053(97): 2061-2068  (http://education.[facility].com/faq/FAQ164)    HDL  Date Value Ref Range Status  11/11/2021 51 > OR = 40 mg/dL Final   Triglycerides  Date Value Ref Range Status  11/11/2021 91 <150 mg/dL Final         Passed - Patient is not pregnant      Passed - Valid encounter within last 12 months    Recent Outpatient Visits           7 months ago [place] Dennard Schaumann, [name], MD   8 months ago Diarrhea, unspecified type   [place] [name], MD   9 months ago Sore throat   [place] Eulogio Bear, NP   11 months ago Encounter for [employer] annual wellness exam   [place] [name], [name], MD   1 year ago Morbid obesity [facility])   [place] [name], [name], MD       Future Appointments             In 3 weeks [name], [name], MD [place], [facility]   In 3 months Patwardhan, Reynold Bowen, MD [facility]              furosemide (LASIX) 40 MG tablet 90 tablet 0    Sig: Take 1 tablet (40 mg total) by mouth daily.     Cardiovascular:  Diuretics - Loop Failed - 02/24/2022 10:11 AM      Failed - Mg Level in normal range and within 180 days    No results found for: "MG"       Failed - Valid encounter within last 6 months    Recent Outpatient Visits           7 months ago [place], Warren T, MD   8 months ago Diarrhea, unspecified type   [place] [name], MD   9 months ago Sore throat   [place] Eulogio Bear, NP   11 months ago Encounter for [employer] annual wellness exam   [place] [name], [name], MD   1 year ago Morbid obesity [facility])   [place] [name], [name], MD       Future Appointments             In 3  weeks [name], MD [place], P[facility]   In 3 months Patwardhan, Reynold Bowen, MD [facility]            Passed - K in normal range and within 180 days    Potassium  Date Value Ref Range Status  11/11/2021 4.2 3.5 - 5.3 mmol/L Final         Passed - Ca in normal range and within 180 days    Calcium  Date Value Ref Range Status  11/11/2021 9.1 8.6 - 10.3 mg/dL Final         Passed - Na in normal range and within 180 days    Sodium  Date Value Ref Range Status  11/11/2021 139 135 - 146 mmol/L Final  09/23/2020 141 134 - 144 mmol/L Final         Passed - Cr in normal range and within 180 days    Creat  Date Value Ref Range Status  11/11/2021 1.01 0.70 - 1.35 mg/dL Final         Passed - Cl in normal range and within 180 days    Chloride  Date Value Ref Range Status  11/11/2021 105 98 - 110 mmol/L Final         Passed - Last BP in normal range    BP Readings from Last 1 Encounters:  11/11/21 118/66          pantoprazole (PROTONIX) 40 MG tablet 90 tablet 0    Sig: Take 1 tablet (40 mg  total) by mouth daily.     Gastroenterology: Proton Pump Inhibitors Passed - 02/24/2022 10:11 AM      Passed - Valid encounter within last 12 months    Recent Outpatient Visits           7 months ago [place], [name], MD   8 months ago Diarrhea, unspecified type   [place] [name], MD   9 months ago Sore throat   [place] Eulogio Bear, NP   11 months ago Encounter for [employer] annual wellness exam   [place] [name], [name], MD   1 year ago Morbid obesity [facility])   [place] [name], [name], MD       Future Appointments             In 3 weeks [name], [name], MD [place], [facility]   In 3 months Patwardhan, Reynold Bowen, MD [facility]

## 2022-03-14 ENCOUNTER — Encounter: Payer: Self-pay | Admitting: Family Medicine

## 2022-03-15 ENCOUNTER — Other Ambulatory Visit: Payer: Medicare Other

## 2022-03-15 DIAGNOSIS — I10 Essential (primary) hypertension: Secondary | ICD-10-CM

## 2022-03-15 DIAGNOSIS — E782 Mixed hyperlipidemia: Secondary | ICD-10-CM

## 2022-03-15 DIAGNOSIS — J441 Chronic obstructive pulmonary disease with (acute) exacerbation: Secondary | ICD-10-CM

## 2022-03-15 DIAGNOSIS — N4883 Acquired buried penis: Secondary | ICD-10-CM

## 2022-03-15 DIAGNOSIS — R739 Hyperglycemia, unspecified: Secondary | ICD-10-CM

## 2022-03-16 LAB — CBC WITH DIFFERENTIAL/PLATELET
Absolute Monocytes: 619 cells/uL (ref 200–950)
Basophils Absolute: 46 cells/uL (ref 0–200)
Basophils Relative: 0.5 %
Eosinophils Absolute: 127 cells/uL (ref 15–500)
Eosinophils Relative: 1.4 %
HCT: 42.3 % (ref 38.5–50.0)
Hemoglobin: 14.5 g/dL (ref 13.2–17.1)
Lymphs Abs: 2530 cells/uL (ref 850–3900)
MCH: 31.5 pg (ref 27.0–33.0)
MCHC: 34.3 g/dL (ref 32.0–36.0)
MCV: 92 fL (ref 80.0–100.0)
MPV: 12.3 fL (ref 7.5–12.5)
Monocytes Relative: 6.8 %
Neutro Abs: 5779 cells/uL (ref 1500–7800)
Neutrophils Relative %: 63.5 %
Platelets: 233 10*3/uL (ref 140–400)
RBC: 4.6 10*6/uL (ref 4.20–5.80)
RDW: 13 % (ref 11.0–15.0)
Total Lymphocyte: 27.8 %
WBC: 9.1 10*3/uL (ref 3.8–10.8)

## 2022-03-16 LAB — COMPLETE METABOLIC PANEL WITH GFR
AG Ratio: 1.4 (calc) (ref 1.0–2.5)
ALT: 19 U/L (ref 9–46)
AST: 14 U/L (ref 10–35)
Albumin: 4 g/dL (ref 3.6–5.1)
Alkaline phosphatase (APISO): 76 U/L (ref 35–144)
BUN: 14 mg/dL (ref 7–25)
CO2: 26 mmol/L (ref 20–32)
Calcium: 9.3 mg/dL (ref 8.6–10.3)
Chloride: 106 mmol/L (ref 98–110)
Creat: 1.05 mg/dL (ref 0.70–1.35)
Globulin: 2.8 g/dL (calc) (ref 1.9–3.7)
Glucose, Bld: 96 mg/dL (ref 65–99)
Potassium: 4.4 mmol/L (ref 3.5–5.3)
Sodium: 142 mmol/L (ref 135–146)
Total Bilirubin: 0.6 mg/dL (ref 0.2–1.2)
Total Protein: 6.8 g/dL (ref 6.1–8.1)
eGFR: 77 mL/min/{1.73_m2} (ref >=60)

## 2022-03-16 LAB — HEMOGLOBIN A1C
Hgb A1c MFr Bld: 6.1 % of total Hgb — ABNORMAL HIGH (ref <5.7)
Mean Plasma Glucose: 128 mg/dL
eAG (mmol/L): 7.1 mmol/L

## 2022-03-16 LAB — LIPID PANEL
Cholesterol: 139 mg/dL (ref <200)
HDL: 53 mg/dL (ref >=40)
LDL Cholesterol (Calc): 68 mg/dL (calc)
Non-HDL Cholesterol (Calc): 86 mg/dL (calc) (ref <130)
Total CHOL/HDL Ratio: 2.6 (calc) (ref <5.0)
Triglycerides: 92 mg/dL (ref <150)

## 2022-03-16 LAB — MICROALBUMIN / CREATININE URINE RATIO
Creatinine, Urine: 218 mg/dL (ref 20–320)
Microalb Creat Ratio: 5 mcg/mg creat (ref <30)
Microalb, Ur: 1.1 mg/dL

## 2022-03-16 LAB — PSA: PSA: 0.3 ng/mL (ref <=4.00)

## 2022-03-17 ENCOUNTER — Other Ambulatory Visit: Payer: Medicare Other

## 2022-03-23 ENCOUNTER — Ambulatory Visit (INDEPENDENT_AMBULATORY_CARE_PROVIDER_SITE_OTHER): Payer: Medicare Other | Admitting: Family Medicine

## 2022-03-23 VITALS — BP 128/72 | HR 70 | Ht 70.0 in | Wt 359.6 lb

## 2022-03-23 DIAGNOSIS — I10 Essential (primary) hypertension: Secondary | ICD-10-CM

## 2022-03-23 DIAGNOSIS — E782 Mixed hyperlipidemia: Secondary | ICD-10-CM | POA: Diagnosis not present

## 2022-03-23 DIAGNOSIS — Z0000 Encounter for general adult medical examination without abnormal findings: Secondary | ICD-10-CM | POA: Diagnosis not present

## 2022-03-23 DIAGNOSIS — I5189 Other ill-defined heart diseases: Secondary | ICD-10-CM | POA: Diagnosis not present

## 2022-03-23 MED ORDER — SEMAGLUTIDE (1 MG/DOSE) 4 MG/3ML ~~LOC~~ SOPN: 1.0000 mg | PEN_INJECTOR | SUBCUTANEOUS | 2 refills | Status: DC

## 2022-03-23 NOTE — Progress Notes (Signed)
Subjective:    Patient ID: James Branch, male    DOB: 04-11-1956, 66 y.o.   MRN: [id number]  Patient is here today for complete physical exam.  Colonoscopy is up-to-date.  Recently had a PSA checked that was normal.  His immunizations are listed below and are up-to-date including his RSV vaccine.  He has been smoking free for the last 2 years and I congratulated him on his abstinence.  Most recent lab work is listed below and aside from his A1c being slightly elevated at 6.1, his labs are outstanding.  However, my biggest concern remains his elevated BMI.  He stopped his Ozempic due to some diarrhea.  He is interested in restarting it. Immunization History  Administered Date(s) Administered   COVID-19, mRNA, vaccine(Comirnaty)12 years and older 02/08/2022   Fluad Quad(high Dose 65+) 01/26/2021, 01/20/2022   Influenza,inj,Quad PF,6+ Mos 03/14/2017, 03/04/2018, 01/31/2019, 02/05/2020   Influenza-Unspecified 02/28/2016, 02/24/2017   PFIZER(Purple Top)SARS-COV-2 Vaccination 05/23/2019, 06/17/2019, 01/22/2020, 10/29/2020   PNEUMOCOCCAL CONJUGATE-20 01/26/2021   Pfizer Covid-19 Vaccine Bivalent Booster 64yr & up 02/10/2021   Pneumococcal Polysaccharide-23 01/31/2019   Tdap 10/26/2014   Zoster Recombinat (Shingrix) 03/04/2021, 06/10/2021   Zoster, Live 03/28/2016    Lab on 03/15/2022  Component Date Value Ref Range Status   WBC 03/15/2022 9.1  3.8 - 10.8 Thousand/uL Final   RBC 03/15/2022 4.60  4.20 - 5.80 Million/uL Final   Hemoglobin 03/15/2022 14.5  13.2 - 17.1 g/dL Final   HCT 03/15/2022 42.3  38.5 - 50.0 % Final   MCV 03/15/2022 92.0  80.0 - 100.0 fL Final   MCH 03/15/2022 31.5  27.0 - 33.0 pg Final   MCHC 03/15/2022 34.3  32.0 - 36.0 g/dL Final   RDW 03/15/2022 13.0  11.0 - 15.0 % Final   Platelets 03/15/2022 233  140 - 400 Thousand/uL Final   MPV 03/15/2022 12.3  7.5 - 12.5 fL Final   Neutro Abs 03/15/2022 5,779  1,500 - 7,800 cells/uL Final   Lymphs Abs 03/15/2022 2,530  850 -  3,900 cells/uL Final   Absolute Monocytes 03/15/2022 619  200 - 950 cells/uL Final   Eosinophils Absolute 03/15/2022 127  15 - 500 cells/uL Final   Basophils Absolute 03/15/2022 46  0 - 200 cells/uL Final   Neutrophils Relative % 03/15/2022 63.5  % Final   Total Lymphocyte 03/15/2022 27.8  % Final   Monocytes Relative 03/15/2022 6.8  % Final   Eosinophils Relative 03/15/2022 1.4  % Final   Basophils Relative 03/15/2022 0.5  % Final   Glucose, Bld 03/15/2022 96  65 - 99 mg/dL Final   Comment: .            Fasting reference interval .    BUN 03/15/2022 14  7 - 25 mg/dL Final   Creat 03/15/2022 1.05  0.70 - 1.35 mg/dL Final   eGFR 03/15/2022 77  > OR = 60 mL/min/1.73mFinal   BUN/Creatinine Ratio 03/15/2022 SEE NOTE:  6 - 22 (calc) Final   Comment:    Not Reported: BUN and Creatinine are within    reference range. .    Sodium 03/15/2022 142  135 - 146 mmol/L Final   Potassium 03/15/2022 4.4  3.5 - 5.3 mmol/L Final   Chloride 03/15/2022 106  98 - 110 mmol/L Final   CO2 03/15/2022 26  20 - 32 mmol/L Final   Calcium 03/15/2022 9.3  8.6 - 10.3 mg/dL Final   Total Protein 03/15/2022 6.8  6.1 - 8.1 g/dL  Final   Albumin 03/15/2022 4.0  3.6 - 5.1 g/dL Final   Globulin 03/15/2022 2.8  1.9 - 3.7 g/dL (calc) Final   AG Ratio 03/15/2022 1.4  1.0 - 2.5 (calc) Final   Total Bilirubin 03/15/2022 0.6  0.2 - 1.2 mg/dL Final   Alkaline phosphatase (APISO) 03/15/2022 76  35 - 144 U/L Final   AST 03/15/2022 14  10 - 35 U/L Final   ALT 03/15/2022 19  9 - 46 U/L Final   Cholesterol 03/15/2022 139  <200 mg/dL Final   HDL 03/15/2022 53  > OR = 40 mg/dL Final   Triglycerides 03/15/2022 92  <150 mg/dL Final   LDL Cholesterol (Calc) 03/15/2022 68  mg/dL (calc) Final   Comment: Reference range: <100 . Desirable range <100 mg/dL for primary prevention;   <70 mg/dL for patients with CHD or diabetic patients  with > or = 2 CHD risk factors. Marland Kitchen LDL-C is now calculated using the Martin-Hopkins   calculation, which is a validated novel method providing  better accuracy than the Friedewald equation in the  estimation of LDL-C.  Cresenciano Genre et al. Annamaria Helling. 9892;119(41): 2061-2068  (http://education.[facility].com/faq/FAQ164)    Total CHOL/HDL Ratio 03/15/2022 2.6  <5.0 (calc) Final   Non-HDL Cholesterol (Calc) 03/15/2022 86  <130 mg/dL (calc) Final   Comment: For patients with diabetes plus 1 major ASCVD risk  factor, treating to a non-HDL-C goal of <100 mg/dL  (LDL-C of <70 mg/dL) is considered a therapeutic  option.    PSA 03/15/2022 0.30  < OR = 4.00 ng/mL Final   Comment: The total PSA value from this assay system is  standardized against the WHO standard. The test  result will be approximately 20% lower when compared  to the equimolar-standardized total PSA (Beckman  Coulter). Comparison of serial PSA results should be  interpreted with this fact in mind. . This test was performed using the Siemens  chemiluminescent method. Values obtained from  different assay methods cannot be used interchangeably. PSA levels, regardless of value, should not be interpreted as absolute evidence of the presence or absence of disease.    Hgb A1c MFr Bld 03/15/2022 6.1 (H)  <5.7 % of total Hgb Final   Comment: For someone without known diabetes, a hemoglobin  A1c value between 5.7% and 6.4% is consistent with prediabetes and should be confirmed with a  follow-up test. . For someone with known diabetes, a value <7% indicates that their diabetes is well controlled. A1c targets should be individualized based on duration of diabetes, age, comorbid conditions, and other considerations. . This assay result is consistent with an increased risk of diabetes. . Currently, no consensus exists regarding use of hemoglobin A1c for diagnosis of diabetes for children. .    Mean Plasma Glucose 03/15/2022 128  mg/dL Final   eAG (mmol/L) 03/15/2022 7.1  mmol/L Final   Creatinine, Urine  03/15/2022 218  20 - 320 mg/dL Final   Microalb, Ur 03/15/2022 1.1  mg/dL Final   Comment: Reference Range Not established    Microalb Creat Ratio 03/15/2022 5  <30 mcg/mg creat Final   Comment: . The ADA defines abnormalities in albumin excretion as follows: Marland Kitchen Albuminuria Category        Result (mcg/mg creatinine) . Normal to Mildly increased   <30 Moderately increased         30-299  Severely increased           > OR = 300 . The ADA recommends that at  least two of three specimens collected within a 3-6 month period be abnormal before considering a patient to be within a diagnostic category.     Past Medical History:  Diagnosis Date   Arthritis    knees   Asthma    CAD (coronary artery disease)    COPD (chronic obstructive pulmonary disease) (HCC)    Diastolic dysfunction    GERD (gastroesophageal reflux disease)    Hyperlipidemia    Hypertension    Obesity    OSA on CPAP    ahi-84, 18 CWP   Sleep apnea    wears cpap    Smoker    Umbilical hernia    Past Surgical History:  Procedure Laterality Date   CARDIAC CATHETERIZATION     COLONOSCOPY  2007   INSERTION OF MESH N/A 12/16/2020   Procedure: INSERTION OF MESH;  Surgeon: Coralie Keens, MD;  Location: [place];  Service: General;  Laterality: N/A;   LEFT HEART CATH AND CORONARY ANGIOGRAPHY N/A 10/12/2020   Procedure: LEFT HEART CATH AND CORONARY ANGIOGRAPHY;  Surgeon: [name], MD;  Location: [place] [facility];  Service: Cardiovascular;  Laterality: N/A;   TONSILLECTOMY  9030   UMBILICAL HERNIA REPAIR N/A 12/16/2020   Procedure: UMBILICAL HERNIA REPAIR WITH MESH;  Surgeon: Coralie Keens, MD;  Location: [place];  Service: General;  Laterality: N/A;  LMA     Allergies  Allergen Reactions   Penicillins Swelling   Social History   Socioeconomic History   Marital status: Married    Spouse name: [name]   Number of children: 4   Years of education: Not on file   Highest education level: Not on  file  Occupational History   Occupation: Retired  Tobacco Use   Smoking status: Former    Packs/day: 0.50    Years: 20.00    Total pack years: 10.00    Types: Cigarettes    Quit date: 02/17/2019    Years since quitting: 3.0   Smokeless tobacco: Never  Vaping Use   Vaping Use: Never used  Substance and Sexual Activity   Alcohol use: No    Alcohol/week: 0.0 standard drinks of alcohol   Drug use: No   Sexual activity: Yes    Birth control/protection: Condom  Other Topics Concern   Not on file  Social History Narrative   4 children, 18 grandchildren.   Social Determinants of Health   Financial Resource Strain: Low Risk  (02/10/2021)   Overall Financial Resource Strain (CARDIA)    Difficulty of Paying Living Expenses: Not hard at all  Food Insecurity: No Food Insecurity (02/10/2021)   Hunger Vital Sign    Worried About Running Out of Food in the Last Year: Never true    Ran Out of Food in the Last Year: Never true  Transportation Needs: No Transportation Needs (02/10/2021)   PRAPARE - Hydrologist (Medical): No    Lack of Transportation (Non-Medical): No  Physical Activity: Insufficiently Active (02/10/2021)   Exercise Vital Sign    Days of Exercise per Week: 5 days    Minutes of Exercise per Session: 10 min  Stress: No Stress Concern Present (02/10/2021)   [place]    Feeling of Stress : Not at all  Social Connections: Moderately Isolated (02/10/2021)   Social Connection and Isolation Panel [NHANES]    Frequency of Communication with Friends and Family: More than three times a week  Frequency of Social Gatherings with Friends and Family: More than three times a week    Attends Religious Services: Never    Marine scientist or Organizations: No    Attends Archivist Meetings: Never    Marital Status: Married  Human resources officer Violence: Not At Risk  (02/10/2021)   Humiliation, Afraid, Rape, and Kick questionnaire    Fear of Current or Ex-Partner: No    Emotionally Abused: No    Physically Abused: No    Sexually Abused: No      Review of Systems  All other systems reviewed and are negative.      Objective:   Physical Exam Vitals reviewed.  Constitutional:      General: He is not in acute distress.    Appearance: Normal appearance. He is obese. He is not ill-appearing or toxic-appearing.  Cardiovascular:     Rate and Rhythm: Normal rate and regular rhythm.     Heart sounds: Normal heart sounds. No murmur heard. Pulmonary:     Effort: Pulmonary effort is normal. No accessory muscle usage or respiratory distress.     Breath sounds: Decreased breath sounds present. No wheezing, rhonchi or rales.  Abdominal:     General: Bowel sounds are normal. There is no distension.     Palpations: Abdomen is soft.  Musculoskeletal:     Right lower leg: No edema.     Left lower leg: No edema.  Neurological:     Mental Status: He is alert.         .Essential hypertension  Mixed hyperlipidemia  Diastolic dysfunction  Morbid obesity ([place])  Encounter for Medicare annual wellness exam Patient denies any falls, depression, or memory loss.  Blood pressure, blood sugar, and cholesterol are excellent.  Colonoscopy and PSA are up-to-date.  Immunizations are up-to-date.  Strongly recommended resuming Ozempic 1 mg subcu weekly and uptitrate as tolerated.  Recommended aerobic exercise and gradually increase duration and intensity to facilitate weight loss.  Recommended a Mediterranean calorie restricted diet.

## 2022-04-05 ENCOUNTER — Telehealth: Payer: Self-pay | Admitting: Family Medicine

## 2022-04-05 NOTE — Telephone Encounter (Signed)
Left message to return call; need to reschedule Medicare AWV. Nurse out of office on 04/12/22.

## 2022-04-15 ENCOUNTER — Encounter: Payer: Self-pay | Admitting: Family Medicine

## 2022-04-20 ENCOUNTER — Other Ambulatory Visit: Payer: Self-pay | Admitting: Family Medicine

## 2022-04-20 ENCOUNTER — Other Ambulatory Visit: Payer: Self-pay

## 2022-04-20 DIAGNOSIS — R0609 Other forms of dyspnea: Secondary | ICD-10-CM

## 2022-04-20 DIAGNOSIS — J441 Chronic obstructive pulmonary disease with (acute) exacerbation: Secondary | ICD-10-CM

## 2022-04-20 DIAGNOSIS — G4733 Obstructive sleep apnea (adult) (pediatric): Secondary | ICD-10-CM

## 2022-04-20 DIAGNOSIS — R059 Cough, unspecified: Secondary | ICD-10-CM

## 2022-04-20 MED ORDER — BREZTRI AEROSPHERE 160-9-4.8 MCG/ACT IN AERO: 2.0000 | INHALATION_SPRAY | Freq: Two times a day (BID) | RESPIRATORY_TRACT | 11 refills | Status: DC

## 2022-04-20 NOTE — Telephone Encounter (Signed)
Requested medication (s) are due for refill today - yes  Requested medication (s) are on the active medication list -yes  Future visit scheduled -no  Last refill: 04/20/21 10.7g 11RF  Notes to clinic: medication not assigned protocol- provider review   Requested Prescriptions  Pending Prescriptions Disp [place] 160-9-4.8 MCG/ACT AERO [Pharmacy Med Name: Judithann Sauger Aerosphere 160 mcg-21mg-4.8mcg/actuation HFA aerosol inhaler] 10.7 g 11    Sig: INHALE TWO PUFFS BY MOUTH INTO LUNGS twice daily     Off-Protocol Failed - 04/20/2022  1:36 PM      Failed - Medication not assigned to a protocol, review manually.      Passed - Valid encounter within last 12 months    Recent Outpatient Visits           9 months ago DChristopherPDennard Schaumann WCammie Mcgee MD   10 months ago Diarrhea, unspecified type   BPontoon BeachPDennard SchaumannWCammie Mcgee MD   11 months ago Sore throat   BPaintMEulogio Bear NP   1 year ago Encounter for MCommercial Metals Companyannual wellness exam   BKong[place]akPickard, WCammie Mcgee MD   1 year ago Morbid obesity ([facility]   BJonni SangerFamily Medicine [name], WCammie Mcgee MD       Future Appointments             In 2 months Patwardhan, MReynold Bowen MD [facility]               Requested Prescriptions  Pending Prescriptions Disp Refills   BREZTRI AEROSPHERE 160-9-4.8 MCG/ACT AERO [Pharmacy Med Name: BJudithann SaugerAerosphere 160 mcg-964m-4.8mcg/actuation HFA aerosol inhaler] 10.7 g 11    Sig: INHALE TWO PUFFS BY MOUTH INTO LUNGS twice daily     Off-Protocol Failed - 04/20/2022  1:36 PM      Failed - Medication not assigned to a protocol, review manually.      Passed - Valid encounter within last 12 months    Recent Outpatient Visits           9 months ago DyAutaugavilleiDennard SchaumannWaCammie McgeeMD   10 months ago Diarrhea, unspecified type   BrCity of the SuniDennard SchaumannaCammie McgeeMD   11 months ago Sore throat   BrMiddletownaEulogio BearNP   1 year ago Encounter for MeCommercial Metals Companynnual wellness exam   BrNew Castleickard, WaCammie McgeeMD   1 year ago [facility]  BrJonni Sangeramily Medicine [name], WaCammie McgeeMD       Future Appointments             In 2 months Patwardhan, MaReynold BowenMD [facility]Do[facility]

## 2022-04-26 ENCOUNTER — Ambulatory Visit (INDEPENDENT_AMBULATORY_CARE_PROVIDER_SITE_OTHER): Payer: Medicare Other

## 2022-04-26 ENCOUNTER — Telehealth: Payer: Self-pay

## 2022-04-26 VITALS — Ht 70.0 in | Wt 359.0 lb

## 2022-04-26 DIAGNOSIS — Z0000 Encounter for general adult medical examination without abnormal findings: Secondary | ICD-10-CM | POA: Diagnosis not present

## 2022-04-26 DIAGNOSIS — H539 Unspecified visual disturbance: Secondary | ICD-10-CM

## 2022-04-26 NOTE — Progress Notes (Signed)
Subjective:   James Branch is a 67 y.o. male who presents for Medicare Annual/Subsequent preventive examination. Virtual Visit via Telephone Note  I connected with  James Branch on 04/26/22 at  9:00 AM EST by telephone and verified that I am speaking with the correct person using two identifiers.  Location: Patient: HOME Provider: [facility] Persons participating in the virtual visit: patient/Nurse Health Advisor   I discussed the limitations, risks, security and privacy concerns of performing an evaluation and management service by telephone and the availability of in person appointments. The patient expressed understanding and agreed to proceed.  Interactive audio and video telecommunications were attempted between this nurse and patient, however failed, due to patient having technical difficulties OR patient did not have access to video capability.  We continued and completed visit with audio only.  Some vital signs may be absent or patient reported.   [name], James Branch  Review of Systems     Cardiac Risk Factors include: advanced age (>21mn, >>88women);dyslipidemia;hypertension;male gender;obesity (BMI >30kg/m2);sedentary lifestyle     Objective:    Today's Vitals   04/26/22 0850  Weight: (!) 359 lb (162.8 kg)  Height: '5\' 10"'$  (1.778 m)   Body mass index is 51.51 kg/m.     04/26/2022    8:57 AM 02/10/2021    9:07 AM 12/08/2020    8:10 AM 10/12/2020    1:35 PM 01/31/2019    8:36 AM 12/30/2016    1:01 PM  Advanced Directives  Does Patient Have a Medical Advance Directive? Yes No No No No No  Type of AParamedicof AOsloLiving will       Copy of HEdgeleyin Chart? No - copy requested       Would patient like information on creating a medical advance directive?  No - Patient declined No - Patient declined No - Patient declined      Current Medications (verified) Outpatient Encounter Medications as of 04/26/2022   Medication Sig   atorvastatin (LIPITOR) 20 MG tablet Take 1 tablet (20 mg total) by mouth daily.   Budeson-Glycopyrrol-Formoterol (BREZTRI AEROSPHERE) 160-9-4.8 MCG/ACT AERO Inhale 2 puffs into the lungs 2 (two) times daily.   furosemide (LASIX) 40 MG tablet Take 1 tablet (40 mg total) by mouth daily.   lisinopril (ZESTRIL) 20 MG tablet TAKE ONE TABLET BY MOUTH ONCE DAILY   metoprolol tartrate (LOPRESSOR) 25 MG tablet TAKE 1/2 TABLET BY MOUTH TWICE DAILY   nitroGLYCERIN (NITROSTAT) 0.4 MG SL tablet Place 0.4 mg under the tongue every 5 (five) minutes as needed for chest pain.   pantoprazole (PROTONIX) 40 MG tablet Take 1 tablet (40 mg total) by mouth daily.   Semaglutide, 1 MG/DOSE, 4 MG/3ML SOPN Inject 1 mg as directed once a week.   sildenafil (VIAGRA) 50 MG tablet Take 50 mg by mouth daily as needed.   [DISCONTINUED] COVID-19 mRNA vaccine 2023-2024 (COMIRNATY) syringe Inject into the muscle. (Patient not taking: Reported on 04/26/2022)   [DISCONTINUED] RSV vaccine recomb adjuvanted (AREXVY) 120 MCG/0.5ML injection Inject into the muscle. (Patient not taking: Reported on 04/26/2022)   No facility-administered encounter medications on file as of 04/26/2022.    Allergies (verified) Penicillins   History: Past Medical History:  Diagnosis Date   Arthritis    knees   Asthma    CAD (coronary artery disease)    COPD (chronic obstructive pulmonary disease) (HCC)    Diastolic dysfunction    GERD (gastroesophageal reflux  disease)    Hyperlipidemia    Hypertension    Obesity    OSA on CPAP    ahi-84, 18 CWP   Sleep apnea    wears cpap    Smoker    Umbilical hernia    Past Surgical History:  Procedure Laterality Date   CARDIAC CATHETERIZATION     COLONOSCOPY  2007   INSERTION OF MESH N/A 12/16/2020   Procedure: INSERTION OF MESH;  Surgeon: Coralie Keens, MD;  Location: [facility];  Service: General;  Laterality: N/A;   LEFT HEART CATH AND CORONARY ANGIOGRAPHY N/A 10/12/2020    Procedure: LEFT HEART CATH AND CORONARY ANGIOGRAPHY;  Surgeon: [name], MD;  Location: [place] [facility];  Service: Cardiovascular;  Laterality: N/A;   TONSILLECTOMY  9476   UMBILICAL HERNIA REPAIR N/A 12/16/2020   Procedure: UMBILICAL HERNIA REPAIR WITH MESH;  Surgeon: Coralie Keens, MD;  Location: [place];  Service: General;  Laterality: N/A;  LMA   Family History  Problem Relation Age of Onset   Hypertension Mother    Hypertension Father    Throat cancer Father    Colon cancer Neg Hx    Colon polyps Neg Hx    Esophageal cancer Neg Hx    Rectal cancer Neg Hx    Stomach cancer Neg Hx    Social History   Socioeconomic History   Marital status: Married    Spouse name: James Branch   Number of children: 4   Years of education: Not on file   Highest education level: Not on file  Occupational History   Occupation: Retired  Tobacco Use   Smoking status: Former    Packs/day: 0.50    Years: 20.00    Total pack years: 10.00    Types: Cigarettes    Quit date: 02/17/2019    Years since quitting: 3.1   Smokeless tobacco: Never  Vaping Use   Vaping Use: Never used  Substance and Sexual Activity   Alcohol use: No    Alcohol/week: 0.0 standard drinks of alcohol   Drug use: No   Sexual activity: Yes    Birth control/protection: Condom  Other Topics Concern   Not on file  Social History Narrative   4 children, 18 grandchildren.   Social Determinants of Health   Financial Resource Strain: Low Risk  (04/26/2022)   Overall Financial Resource Strain (CARDIA)    Difficulty of Paying Living Expenses: Not hard at all  Food Insecurity: No Food Insecurity (04/26/2022)   Hunger Vital Sign    Worried About Running Out of Food in the Last Year: Never true    Ran Out of Food in the Last Year: Never true  Transportation Needs: No Transportation Needs (04/26/2022)   PRAPARE - Hydrologist (Medical): No    Lack of Transportation (Non-Medical): No  Physical  Activity: Sufficiently Active (04/26/2022)   Exercise Vital Sign    Days of Exercise per Week: 5 days    Minutes of Exercise per Session: 30 min  Stress: No Stress Concern Present (04/26/2022)   [place]    Feeling of Stress : Not at all  Social Connections: [place] (04/26/2022)   Social Connection and Isolation Panel [NHANES]    Frequency of Communication with Friends and Family: More than three times a week    Frequency of Social Gatherings with Friends and Family: More than three times a week    Attends  Religious Services: More than 4 times per year    Active Member of Clubs or Organizations: Yes    Attends Archivist Meetings: More than 4 times per year    Marital Status: Married    Tobacco Counseling Counseling given: Not Answered   Clinical Intake:  Pre-visit preparation completed: Yes        BMI - recorded: 51.51 Nutritional Status: BMI > 30  Obese Nutritional Risks: None Diabetes: No  How often do you need to have someone help you when you read instructions, pamphlets, or other written materials from your doctor or pharmacy?: 1 - Never  Diabetic?NO  Interpreter Needed?: No  Information entered by :: James Johannah Rozas, James Branch   Activities of Daily Living    04/26/2022    8:57 AM 04/19/2022    7:51 AM  In your present state of health, do you have any difficulty performing the following activities:  Hearing? 0 0  Vision? 0 0  Difficulty concentrating or making decisions? 0 0  Walking or climbing stairs? 0 0  Dressing or bathing? 0 0  Doing errands, shopping? 0 0  Preparing Food and eating ? N N  Using the Toilet? N N  In the past six months, have you accidently leaked urine? N N  Do you have problems with loss of bowel control? N N  Managing your Medications? N N  Managing your Finances? N N  Housekeeping or managing your Housekeeping? N N    Patient Care Team: [name], MD as PCP - General (Family Medicine) Edythe Clarity, [facility] as Pharmacist (Pharmacist)  Indicate any recent Medical Services you may have received from other than [facility] providers in the past year (date may be approximate).     Assessment:   This is a routine wellness examination for James Branch.  Hearing/Vision screen Hearing Screening - Comments:: Wears hearing aids Vision Screening - Comments:: Glasses. 09/2021. [facility].  Dietary issues and exercise activities discussed: Current Exercise Habits: Home exercise routine, Type of exercise: walking, Time (Minutes): 30, Frequency (Times/Week): 5, Weekly Exercise (Minutes/Week): 150, Intensity: Mild, Exercise limited by: cardiac condition(s);orthopedic condition(s)   Goals Addressed             This Visit's Progress    Exercise 3x per week (30 min per time)   Not on track    Lose weight.       Depression Screen    04/26/2022    8:56 AM 03/23/2022    8:42 AM 03/22/2021   12:11 PM 02/10/2021    9:03 AM 04/08/2020   12:11 PM 01/31/2019    8:36 AM 01/31/2019    8:30 AM  PHQ 2/9 Scores  PHQ - 2 Score 0 0 0 0 0 0 0  PHQ- 9 Score   2        Fall Risk    04/26/2022    8:57 AM 04/19/2022    7:51 AM 03/27/2022    6:16 PM 03/23/2022    8:42 AM 03/22/2021   12:11 PM  Fall Risk   Falls in the past year? 0 0 0 0 0  Number falls in past yr: 0 0 0 0 0  Injury with Fall? 0 0 0 0 0  Risk for fall due to : No Fall Risks   No Fall Risks   Follow up Falls prevention discussed   Falls prevention discussed     FALL RISK PREVENTION PERTAINING TO THE HOME:  Any  stairs in or around the home? Yes  If so, are there any without handrails? No  Home free of loose throw rugs in walkways, pet beds, electrical cords, etc? Yes  Adequate lighting in your home to reduce risk of falls? Yes   ASSISTIVE DEVICES UTILIZED TO PREVENT FALLS:  Life alert? No  Use of a cane, walker or w/c? No  Grab bars in the bathroom? Yes   Shower chair or bench in shower? Yes  Elevated toilet seat or a handicapped toilet? Yes   TIMED UP AND GO:  Was the test performed? No .  PHONE VISIT  Cognitive Function:        04/26/2022    8:57 AM 02/10/2021    9:10 AM  6CIT Screen  What Year? 0 points 0 points  What month? 0 points 0 points  What time? 0 points 0 points  Count back from 20 0 points 0 points  Months in reverse 0 points 0 points  Repeat phrase 0 points 0 points  Total Score 0 points 0 points    Immunizations Immunization History  Administered Date(s) Administered   COVID-19, mRNA, vaccine(Comirnaty)12 years and older 02/08/2022, 04/15/2022   Fluad Quad(high Dose 65+) 01/26/2021, 01/20/2022   Influenza,inj,Quad PF,6+ Mos 03/14/2017, 03/04/2018, 01/31/2019, 02/05/2020   Influenza-Unspecified 02/28/2016, 02/24/2017   PFIZER(Purple Top)SARS-COV-2 Vaccination 05/23/2019, 06/17/2019, 01/22/2020, 10/29/2020   PNEUMOCOCCAL CONJUGATE-20 01/26/2021   Pfizer Covid-19 Vaccine Bivalent Booster 51yr & up 02/10/2021   Pneumococcal Polysaccharide-23 01/31/2019   Tdap 10/26/2014   Zoster Recombinat (Shingrix) 03/04/2021, 06/10/2021   Zoster, Live 03/28/2016    TDAP status: Up to date  Flu Vaccine status: Up to date  Pneumococcal vaccine status: Up to date  Covid-19 vaccine status: Completed vaccines  Qualifies for Shingles Vaccine? Yes   Zostavax completed Yes   Shingrix Completed?: No.    Education has been provided regarding the importance of this vaccine. Patient has been advised to call insurance company to determine out of pocket expense if they have not yet received this vaccine. Advised may also receive vaccine at local pharmacy or Health Dept. Verbalized acceptance and understanding.  Screening Tests Health Maintenance  Topic Date Due   COVID-19 Vaccine (8 - 2023-24 season) 06/10/2022   Medicare Annual Wellness (AWV)  04/27/2023   DTaP/Tdap/Td (2 - Td or Tdap) 10/25/2024   COLONOSCOPY (Pts  [phone] coverage will need to be confirmed)  03/04/2026   Pneumonia Vaccine 6582Years old  Completed   INFLUENZA VACCINE  Completed   Hepatitis C Screening  Completed   Zoster Vaccines- Shingrix  Completed   HPV VACCINES  Aged Out    Health Maintenance  There are no preventive care reminders to display for this patient.  Colorectal cancer screening: Type of screening: Colonoscopy. Completed 03/05/2019. Repeat every 7 years  Lung Cancer Screening: (Low Dose CT Chest recommended if Age 69-80ears, 30 pack-year currently smoking OR have quit w/in 15years.) does not qualify.   Lung Cancer Screening Referral: N/A  Additional Screening:  Hepatitis C Screening: does qualify; Completed 02/02/2020  Vision Screening: Recommended annual ophthalmology exams for early detection of glaucoma and other disorders of the eye. Is the patient up to date with their annual eye exam?  Yes  Who is the provider or what is the name of the office in which the patient attends annual eye exams? [facility] is not established with a provider, would they like to be referred to a provider to  establish care? No .   Dental Screening: Recommended annual dental exams for proper oral hygiene  Community Resource Referral / Chronic Care Management: CRR required this visit?  No   CCM required this visit?  No      Plan:     I have personally reviewed and noted the following in the patient's chart:   Medical and social history Use of alcohol, tobacco or illicit drugs  Current medications and supplements including opioid prescriptions. Patient is not currently taking opioid prescriptions. Functional ability and status Nutritional status Physical activity Advanced directives List of other physicians Hospitalizations, surgeries, and ER visits in previous 12 months Vitals Screenings to include cognitive, depression, and falls Referrals and appointments  In addition, I have  reviewed and discussed with patient certain preventive protocols, quality metrics, and best practice recommendations. A written personalized care plan for preventive services as well as general preventive health recommendations were provided to patient.     [name], James Branch   [birth year]   Nurse Notes: Pt is up to date with all immunizations and health maintenance. Pt would like referral to Dr. Rolanda Jay at [facility] for c/o vision changes. Pt's wife sees Dr. Nancy Fetter. Order placed.

## 2022-04-26 NOTE — Patient Instructions (Signed)
James Branch , Thank you for taking time to come for your Medicare Wellness Visit. I appreciate your ongoing commitment to your health goals. Please review the following plan we discussed and let me know if I can assist you in the future.   These are the goals we discussed:  Goals      Exercise 3x per week (30 min per time)     Lose weight.     Pharmacy Care Plan:     CARE PLAN ENTRY (see longitudinal plan of care for additional care plan information)  Current Barriers:  Chronic Disease Management support, education, and care coordination needs related to Hyperlipidemia and CAD, and diastolic dysfunction.    Hyperlipidemia Lab Results  Component Value Date/Time   LDLCALC 63 01/31/2019 09:00 AM  Pharmacist Clinical Goal(s): Over the next 180 days, patient will work with PharmD and providers to maintain LDL goal < 70 Current regimen:  Atorvastatin '20mg'$  Interventions: Reviewed most recent lipid panel Discussed current diet and exercise regimen Patient self care activities - Over the next 180 days, patient will: Continue to focus on medication adherence using pill box   CAD Pharmacist Clinical Goal(s) Over the next 180 days, patient will work with PharmD and providers to optimize medication and minimize symptoms of CAD. Current regimen:  ASA '81mg'$  Interventions: Discussed need to control risk factors Counseled on symptoms of abnormal bleeding Patient self care activities - Over the next 180 days, patient will: Continue to focus on medication adherence Contact providers with any new or worsening symptoms  Disatolic Dysfunction Pharmacist Clinical Goal(s) Over the next 180 days, patient will work with PharmD and providers to optimize medication related to diastolic dysfunction. Current regimen:  Metoprolol tartrate '25mg'$  bid Furosemide '40mg'$  daily Lisinopril '20mg'$  Interventions: Comprehensive medication review Discussed current fluid status Patient self care activities - Over  the next 180 days, patient will: Continue to monitor fluid status by weight Contact providers with any new or worsening symptoms Initial goal documentation      Track and Manage My Blood Pressure-Hypertension     Timeframe:  Long-Range Goal Priority:  High Start Date:   11/17/21                          Expected End Date:   05/20/22                    Follow Up Date 02/17/22    - check blood pressure weekly - choose a place to take my blood pressure (home, clinic or office, retail store) - write blood pressure results in a log or diary    Why is this important?   You won't feel high blood pressure, but it can still hurt your blood vessels.  High blood pressure can cause heart or kidney problems. It can also cause a stroke.  Making lifestyle changes like losing a little weight or eating less salt will help.  Checking your blood pressure at home and at different times of the day can help to control blood pressure.  If the doctor prescribes medicine remember to take it the way the doctor ordered.  Call the office if you cannot afford the medicine or if there are questions about it.     Notes:         This is a list of the screening recommended for you and due dates:  Health Maintenance  Topic Date Due   COVID-19 Vaccine (8 - 2023-24  season) 06/10/2022   Medicare Annual Wellness Visit  04/27/2023   DTaP/Tdap/Td vaccine (2 - Td or Tdap) 10/25/2024   Colon Cancer Screening  03/04/2026   Pneumonia Vaccine  Completed   Flu Shot  Completed   Hepatitis C Screening: USPSTF Recommendation to screen - Ages 18-79 yo.  Completed   Zoster (Shingles) Vaccine  Completed   HPV Vaccine  Aged Out    Advanced directives: Please bring a copy of your health care power of attorney and living will to the office to be added to your chart at your convenience.   Conditions/risks identified: Aim for 30 minutes of exercise or brisk walking, 6-8 glasses of water, and 5 servings of fruits and  vegetables each day.   Next appointment: Follow up in one year for your annual wellness visit. 04/2023  Preventive Care 65 Years and Older, Male  Preventive care refers to lifestyle choices and visits with your health care provider that can promote health and wellness. What does preventive care include? A yearly physical exam. This is also called an annual well check. Dental exams once or twice a year. Routine eye exams. Ask your health care provider how often you should have your eyes checked. Personal lifestyle choices, including: Daily care of your teeth and gums. Regular physical activity. Eating a healthy diet. Avoiding tobacco and drug use. Limiting alcohol use. Practicing safe sex. Taking low doses of aspirin every day. Taking vitamin and mineral supplements as recommended by your health care provider. What happens during an annual well check? The services and screenings done by your health care provider during your annual well check will depend on your age, overall health, lifestyle risk factors, and family history of disease. Counseling  Your health care provider may ask you questions about your: Alcohol use. Tobacco use. Drug use. Emotional well-being. Home and relationship well-being. Sexual activity. Eating habits. History of falls. Memory and ability to understand (cognition). Work and work Statistician. Screening  You may have the following tests or measurements: Height, weight, and BMI. Blood pressure. Lipid and cholesterol levels. These may be checked every 5 years, or more frequently if you are over 79 years old. Skin check. Lung cancer screening. You may have this screening every year starting at age 54 if you have a 30-pack-year history of smoking and currently smoke or have quit within the past 15 years. Fecal occult blood test (FOBT) of the stool. You may have this test every year starting at age 45. Flexible sigmoidoscopy or colonoscopy. You may have a  sigmoidoscopy every 5 years or a colonoscopy every 10 years starting at age 48. Prostate cancer screening. Recommendations will vary depending on your family history and other risks. Hepatitis C blood test. Hepatitis B blood test. Sexually transmitted disease (STD) testing. Diabetes screening. This is done by checking your blood sugar (glucose) after you have not eaten for a while (fasting). You may have this done every 1-3 years. Abdominal aortic aneurysm (AAA) screening. You may need this if you are a current or former smoker. Osteoporosis. You may be screened starting at age 41 if you are at high risk. Talk with your health care provider about your test results, treatment options, and if necessary, the need for more tests. Vaccines  Your health care provider may recommend certain vaccines, such as: Influenza vaccine. This is recommended every year. Tetanus, diphtheria, and acellular pertussis (Tdap, Td) vaccine. You may need a Td booster every 10 years. Zoster vaccine. You may need this after age  60. Pneumococcal 13-valent conjugate (PCV13) vaccine. One dose is recommended after age 81. Pneumococcal polysaccharide (PPSV23) vaccine. One dose is recommended after age 71. Talk to your health care provider about which screenings and vaccines you need and how often you need them. This information is not intended to replace advice given to you by your health care provider. Make sure you discuss any questions you have with your health care provider. Document Released: 04/30/2015 Document Revised: 12/22/2015 Document Reviewed: 02/02/2015 Elsevier Interactive Patient Education  2017 [place] Prevention in the Home Falls can cause injuries. They can happen to people of all ages. There are many things you can do to make your home safe and to help prevent falls. What can I do on the outside of my home? Regularly fix the edges of walkways and driveways and fix any cracks. Remove anything  that might make you trip as you walk through a door, such as a raised step or threshold. Trim any bushes or trees on the path to your home. Use bright outdoor lighting. Clear any walking paths of anything that might make someone trip, such as rocks or tools. Regularly check to see if handrails are loose or broken. Make sure that both sides of any steps have handrails. Any raised decks and porches should have guardrails on the edges. Have any leaves, snow, or ice cleared regularly. Use sand or salt on walking paths during winter. Clean up any spills in your garage right away. This includes oil or grease spills. What can I do in the bathroom? Use night lights. Install grab bars by the toilet and in the tub and shower. Do not use towel bars as grab bars. Use non-skid mats or decals in the tub or shower. If you need to sit down in the shower, use a plastic, non-slip stool. Keep the floor dry. Clean up any water that spills on the floor as soon as it happens. Remove soap buildup in the tub or shower regularly. Attach bath mats securely with double-sided non-slip rug tape. Do not have throw rugs and other things on the floor that can make you trip. What can I do in the bedroom? Use night lights. Make sure that you have a light by your bed that is easy to reach. Do not use any sheets or blankets that are too big for your bed. They should not hang down onto the floor. Have a firm chair that has side arms. You can use this for support while you get dressed. Do not have throw rugs and other things on the floor that can make you trip. What can I do in the kitchen? Clean up any spills right away. Avoid walking on wet floors. Keep items that you use a lot in easy-to-reach places. If you need to reach something above you, use a strong step stool that has a grab bar. Keep electrical cords out of the way. Do not use floor polish or wax that makes floors slippery. If you must use wax, use non-skid floor  wax. Do not have throw rugs and other things on the floor that can make you trip. What can I do with my stairs? Do not leave any items on the stairs. Make sure that there are handrails on both sides of the stairs and use them. Fix handrails that are broken or loose. Make sure that handrails are as long as the stairways. Check any carpeting to make sure that it is firmly attached to the stairs. Fix  any carpet that is loose or worn. Avoid having throw rugs at the top or bottom of the stairs. If you do have throw rugs, attach them to the floor with carpet tape. Make sure that you have a light switch at the top of the stairs and the bottom of the stairs. If you do not have them, ask someone to add them for you. What else can I do to help prevent falls? Wear shoes that: Do not have high heels. Have rubber bottoms. Are comfortable and fit you well. Are closed at the toe. Do not wear sandals. If you use a stepladder: Make sure that it is fully opened. Do not climb a closed stepladder. Make sure that both sides of the stepladder are locked into place. Ask someone to hold it for you, if possible. Clearly mark and make sure that you can see: Any grab bars or handrails. First and last steps. Where the edge of each step is. Use tools that help you move around (mobility aids) if they are needed. These include: Canes. Walkers. Scooters. Crutches. Turn on the lights when you go into a dark area. Replace any light bulbs as soon as they burn out. Set up your furniture so you have a clear path. Avoid moving your furniture around. If any of your floors are uneven, fix them. If there are any pets around you, be aware of where they are. Review your medicines with your doctor. Some medicines can make you feel dizzy. This can increase your chance of falling. Ask your doctor what other things that you can do to help prevent falls. This information is not intended to replace advice given to you by your  health care provider. Make sure you discuss any questions you have with your health care provider. Document Released: 01/28/2009 Document Revised: 09/09/2015 Document Reviewed: 05/08/2014 Elsevier Interactive Patient Education  2017 [employer].

## 2022-04-26 NOTE — Telephone Encounter (Signed)
Pt called and states that Ozempic 1 mg is on national backorder but the pharmacy has Ozempic 2 mg. Pt asks if the 2 mg can be sent in? Thank you.

## 2022-04-27 ENCOUNTER — Other Ambulatory Visit: Payer: Self-pay | Admitting: Family Medicine

## 2022-04-27 ENCOUNTER — Telehealth: Payer: Self-pay | Admitting: Family Medicine

## 2022-04-27 MED ORDER — SEMAGLUTIDE (2 MG/DOSE) 8 MG/3ML ~~LOC~~ SOPN: 2.0000 mg | PEN_INJECTOR | SUBCUTANEOUS | 3 refills | Status: DC

## 2022-04-27 NOTE — Telephone Encounter (Signed)
Received call from [facility] with [facility] regarding the referral they received for the patient to see Dr. Rolanda Jay; that provider isn't a part of their practice.  For additional information, please [phone], and ask for [facility].

## 2022-05-01 NOTE — Progress Notes (Signed)
I have collaborated with the care management provider regarding care management and care coordination activities outlined in this encounter and have reviewed this encounter including documentation in the note and care plan. I am certifying that I agree with the content of this note and encounter as supervising physician.  

## 2022-05-04 ENCOUNTER — Other Ambulatory Visit: Payer: Self-pay | Admitting: Family Medicine

## 2022-05-04 ENCOUNTER — Encounter: Payer: Self-pay | Admitting: Family Medicine

## 2022-05-04 ENCOUNTER — Other Ambulatory Visit: Payer: Self-pay

## 2022-05-04 DIAGNOSIS — I2510 Atherosclerotic heart disease of native coronary artery without angina pectoris: Secondary | ICD-10-CM

## 2022-05-04 MED ORDER — NITROGLYCERIN 0.4 MG SL SUBL: 0.4000 mg | SUBLINGUAL_TABLET | SUBLINGUAL | 1 refills | Status: DC | PRN

## 2022-05-04 MED ORDER — NITROGLYCERIN 0.4 MG SL SUBL: 0.4000 mg | SUBLINGUAL_TABLET | SUBLINGUAL | 0 refills | Status: DC | PRN

## 2022-05-04 NOTE — Telephone Encounter (Signed)
Previously Rx by a different provider.  Pls advice

## 2022-05-05 ENCOUNTER — Telehealth: Payer: Self-pay

## 2022-05-05 NOTE — Telephone Encounter (Signed)
Pt's wife called in asking for a referral to be called in to [facility] to Dr. Rolanda Jay. Pt's wife states that the referral needs to be called in the office does not accept faxed referrals. Please advise.  Cb#: [phone]

## 2022-05-10 ENCOUNTER — Telehealth: Payer: Self-pay

## 2022-05-10 NOTE — Telephone Encounter (Signed)
Message from Plan Request Reference Number: [id number]. OZEMPIC INJ '8MG'$ /3ML is approved through 04/17/2023. Your patient may now fill this prescription and it will be covered.

## 2022-05-22 ENCOUNTER — Telehealth: Payer: Self-pay | Admitting: Pharmacist

## 2022-05-22 NOTE — Progress Notes (Signed)
Care Management & Coordination Services Pharmacy Team  Reason for Encounter: Medication coordination and delivery  Contacted patient to discuss medications and coordinate delivery from [facility] pharmacy. Spoke with patient on 05/22/2022   Cycle dispensing form sent to James Branch, CPP for review.    Last adherence delivery date: 03/02/22      Patient is due for next adherence delivery on: 05/31/22   This delivery to include: Vials  90 Days   Atorvastatin 20 mg daily Furosemide 40 mg daily Metoprolol tartrate 25 mg twice daily  Lisinopril 20 mg daily Pantoprazole 40 mg daily Sildenafil 50 mg as directed Breztri inhaler 2 puffs twice daily Ozempic '2mg'$  inject once a week (NEW ADDITION)   Patient declined the following medications this month: None  Refills requested from providers include: Atorvastatin 20 mg daily Pantoprazole 40 mg daily Furosemide 40 mg daily  Confirmed delivery date of 05/31/22, advised patient that pharmacy will contact them the morning of delivery.   Any concerns about your medications? No  How often do you forget or accidentally miss a dose? Rarely  Do you use a pillbox? No  Is patient in packaging No  If yes  What is the date on your next pill pack?  Any concerns or issues with your packaging?   Recent blood pressure readings are as follows:   Recent blood glucose readings are as follows: N/A    Medications: Outpatient Encounter Medications as of 05/22/2022  Medication Sig   atorvastatin (LIPITOR) 20 MG tablet Take 1 tablet (20 mg total) by mouth daily.   Budeson-Glycopyrrol-Formoterol (BREZTRI AEROSPHERE) 160-9-4.8 MCG/ACT AERO Inhale 2 puffs into the lungs 2 (two) times daily.   furosemide (LASIX) 40 MG tablet Take 1 tablet (40 mg total) by mouth daily.   lisinopril (ZESTRIL) 20 MG tablet TAKE ONE TABLET BY MOUTH ONCE DAILY   metoprolol tartrate (LOPRESSOR) 25 MG tablet TAKE 1/2 TABLET BY MOUTH TWICE DAILY   nitroGLYCERIN (NITROSTAT)  0.4 MG SL tablet Place 1 tablet (0.4 mg total) under the tongue every 5 (five) minutes as needed for chest pain.   pantoprazole (PROTONIX) 40 MG tablet Take 1 tablet (40 mg total) by mouth daily.   Semaglutide, 2 MG/DOSE, 8 MG/3ML SOPN Inject 2 mg as directed once a week.   sildenafil (VIAGRA) 50 MG tablet Take 50 mg by mouth daily as needed.   No facility-administered encounter medications on file as of 05/22/2022.   BP Readings from Last 3 Encounters:  03/23/22 128/72  11/11/21 118/66  07/14/21 (!) 130/56    Pulse Readings from Last 3 Encounters:  03/23/22 70  11/11/21 65  07/14/21 65    Lab Results  Component Value Date/Time   HGBA1C 6.1 (H) 03/15/2022 09:08 AM   HGBA1C 5.6 11/11/2021 09:19 AM   Lab Results  Component Value Date   CREATININE 1.05 03/15/2022   BUN 14 03/15/2022   GFRNONAA >60 12/08/2020   GFRAA 94 02/02/2020   NA 142 03/15/2022   K 4.4 03/15/2022   CALCIUM 9.3 03/15/2022   CO2 26 03/15/2022    Future Appointments  Date Time Provider [place]  06/23/2022  1:00 PM Patwardhan, Reynold Bowen, MD [facility] None  11/09/2022  2:30 PM Rosana Hoes, Jerilynn Mages, [place], [facility]

## 2022-05-25 ENCOUNTER — Other Ambulatory Visit: Payer: Self-pay

## 2022-05-25 ENCOUNTER — Other Ambulatory Visit: Payer: Self-pay | Admitting: Family Medicine

## 2022-05-25 MED ORDER — ATORVASTATIN CALCIUM 20 MG PO TABS: 20.0000 mg | ORAL_TABLET | Freq: Every day | ORAL | 0 refills | Status: DC

## 2022-05-25 MED ORDER — PANTOPRAZOLE SODIUM 40 MG PO TBEC: 40.0000 mg | DELAYED_RELEASE_TABLET | Freq: Every day | ORAL | 0 refills | Status: DC

## 2022-05-25 MED ORDER — FUROSEMIDE 40 MG PO TABS: 40.0000 mg | ORAL_TABLET | Freq: Every day | ORAL | 0 refills | Status: DC

## 2022-06-16 ENCOUNTER — Other Ambulatory Visit: Payer: Self-pay

## 2022-06-16 DIAGNOSIS — Z79899 Other long term (current) drug therapy: Secondary | ICD-10-CM

## 2022-06-19 ENCOUNTER — Telehealth: Payer: Self-pay | Admitting: Pharmacist

## 2022-06-19 NOTE — Progress Notes (Signed)
Care Management & Coordination Services Pharmacy Team  Reason for Encounter: Medication coordination and delivery  Contacted patient to discuss medications and coordinate delivery from [facility] pharmacy. Spoke with patient on 06/19/2022  Cycle dispensing form sent to [facility] for review.   Last adherence delivery date: 05/31/2022      Patient is due for next adherence delivery on: 06/29/2022  This delivery to include: Vials  30 Days  Atorvastatin 20 mg daily Furosemide 40 mg daily Metoprolol tartrate 25 mg twice daily  Lisinopril 20 mg daily Pantoprazole 40 mg daily Sildenafil 50 mg as directed Breztri inhaler 2 puffs twice daily Ozempic '2mg'$  inject once a week - patient request to be delivered on 06/22/2022 instead of original delivery date.  No refill request needed.  Confirmed delivery date of 06/29/2022, advised patient that pharmacy will contact them the morning of delivery.   Any concerns about your medications? No  How often do you forget or accidentally miss a dose? Never  Do you use a pillbox? No  Is patient in packaging No  If yes  What is the date on your next pill pack?  Any concerns or issues with your packaging?   Chart review: Recent office visits:  None since last medication coordination call  Recent consult visits:  None since last medication coordination call  Hospital visits:  None in previous 6 months  Medications: Outpatient Encounter Medications as of 06/19/2022  Medication Sig   atorvastatin (LIPITOR) 20 MG tablet Take 1 tablet (20 mg total) by mouth daily.   Budeson-Glycopyrrol-Formoterol (BREZTRI AEROSPHERE) 160-9-4.8 MCG/ACT AERO Inhale 2 puffs into the lungs 2 (two) times daily.   furosemide (LASIX) 40 MG tablet Take 1 tablet (40 mg total) by mouth daily.   lisinopril (ZESTRIL) 20 MG tablet TAKE ONE TABLET BY MOUTH ONCE DAILY   metoprolol tartrate (LOPRESSOR) 25 MG tablet TAKE 1/2 TABLET BY MOUTH TWICE DAILY   nitroGLYCERIN (NITROSTAT)  0.4 MG SL tablet Place 1 tablet (0.4 mg total) under the tongue every 5 (five) minutes as needed for chest pain.   pantoprazole (PROTONIX) 40 MG tablet Take 1 tablet (40 mg total) by mouth daily.   Semaglutide, 2 MG/DOSE, 8 MG/3ML SOPN Inject 2 mg as directed once a week.   sildenafil (VIAGRA) 50 MG tablet Take 50 mg by mouth daily as needed.   No facility-administered encounter medications on file as of 06/19/2022.   BP Readings from Last 3 Encounters:  03/23/22 128/72  11/11/21 118/66  07/14/21 (!) 130/56    Pulse Readings from Last 3 Encounters:  03/23/22 70  11/11/21 65  07/14/21 65    Lab Results  Component Value Date/Time   HGBA1C 6.1 (H) 03/15/2022 09:08 AM   HGBA1C 5.6 11/11/2021 09:19 AM   Lab Results  Component Value Date   CREATININE 1.05 03/15/2022   BUN 14 03/15/2022   GFRNONAA >60 12/08/2020   GFRAA 94 02/02/2020   NA 142 03/15/2022   K 4.4 03/15/2022   CALCIUM 9.3 03/15/2022   CO2 26 03/15/2022     Future Appointments  Date Time Provider [place]  06/23/2022  1:00 PM Patwardhan, Reynold Bowen, MD [facility] None  11/09/2022  2:30 PM Edythe Clarity, [place] None   April D Calhoun, [place] Pharmacist Assistant [phone]

## 2022-06-23 ENCOUNTER — Encounter: Payer: Self-pay | Admitting: Family Medicine

## 2022-06-23 ENCOUNTER — Encounter: Payer: Self-pay | Admitting: Cardiology

## 2022-06-23 ENCOUNTER — Ambulatory Visit: Payer: Medicare Other | Admitting: Cardiology

## 2022-06-23 VITALS — BP 131/70 | HR 75 | Ht 70.0 in | Wt 347.0 lb

## 2022-06-23 DIAGNOSIS — R600 Localized edema: Secondary | ICD-10-CM | POA: Insufficient documentation

## 2022-06-23 DIAGNOSIS — E782 Mixed hyperlipidemia: Secondary | ICD-10-CM

## 2022-06-23 DIAGNOSIS — I10 Essential (primary) hypertension: Secondary | ICD-10-CM

## 2022-06-23 MED ORDER — TORSEMIDE 20 MG PO TABS: 20.0000 mg | ORAL_TABLET | Freq: Every day | ORAL | 3 refills | Status: DC

## 2022-06-23 NOTE — Progress Notes (Signed)
Patient referred by [name], MD for coronary artery calcification.  Subjective:   James Branch, male    DOB: 25-Jan-1956, 67 y.o.   MRN: [id number]   Chief Complaint  Patient presents with   Mixed hyperlipidemia   Essential hypertension   Follow-up    HPI  67 y.o. Caucasian male with controlled hypertension, hyperlipidemia, tobacco dependence, CAD with exertional dyspnea, abnormal stress test, morbid obesity, venous insufficiency  Patients weight has been up and down lately. He was started on Ozempic by his PCP. He has noticed that lasix does not cause diurese like before. This has coincided with leg edema. He is able to walk a mile without any significant shortness of breath symptoms.    Current Outpatient Medications:    atorvastatin (LIPITOR) 20 MG tablet, Take 1 tablet (20 mg total) by mouth daily., Disp: 90 tablet, Rfl: 0   Budeson-Glycopyrrol-Formoterol (BREZTRI AEROSPHERE) 160-9-4.8 MCG/ACT AERO, Inhale 2 puffs into the lungs 2 (two) times daily., Disp: 10.7 g, Rfl: 11   furosemide (LASIX) 40 MG tablet, Take 1 tablet (40 mg total) by mouth daily., Disp: 90 tablet, Rfl: 0   lisinopril (ZESTRIL) 20 MG tablet, TAKE ONE TABLET BY MOUTH ONCE DAILY, Disp: 90 tablet, Rfl: 3   metoprolol tartrate (LOPRESSOR) 25 MG tablet, TAKE 1/2 TABLET BY MOUTH TWICE DAILY, Disp: 60 tablet, Rfl: 3   nitroGLYCERIN (NITROSTAT) 0.4 MG SL tablet, Place 1 tablet (0.4 mg total) under the tongue every 5 (five) minutes as needed for chest pain., Disp: 30 tablet, Rfl: 1   pantoprazole (PROTONIX) 40 MG tablet, Take 1 tablet (40 mg total) by mouth daily., Disp: 90 tablet, Rfl: 0   Semaglutide, 2 MG/DOSE, 8 MG/3ML SOPN, Inject 2 mg as directed once a week., Disp: 3 mL, Rfl: 3   sildenafil (VIAGRA) 50 MG tablet, Take 50 mg by mouth daily as needed., Disp: , Rfl:     Cardiovascular studies:  EKG 06/23/2022: Sinus rhythm 72 bpm  Low voltage   Coronary angiography 10/12/2020:  Left dominant  circulation Mild calcific disease 20% in prox-mid LAD LVEDP 20 mmHg  Dyspnea most likely due to obesity and elevated LVEDP Okay to stop Aspirin Okay to proceed with umbilical hernia surgery    Ultrasound abdominal aorta 02/17/2019: Abdominal Aorta: No evidence of an abdominal aortic aneurysm was visualized. Very limited exam and unable to do a thorough exam of the aorta and iliac arteries.  Stenosis: Very limited exam. From what was seen there is no evidence of stenosis.  Can not rule out stenosis on segments not visualized.  CT chest 02/11/2019: 1. Lung-RADS 2S, benign appearance or behavior. Continue annual screening with low-dose chest CT without contrast in 12 months. 2. The "S" modifier above refers to potentially clinically significant non lung cancer related findings.  Specifically, there is aortic atherosclerosis, in addition to left main and 3 vessel coronary artery disease. Please note that although the presence of coronary artery calcium  documents the presence of coronary artery disease, the severity of this disease and any potential stenosis cannot be assessed on this non-gated CT examination. Assessment for potential risk factor modification,  dietary therapy or pharmacologic therapy may be warranted, if clinically indicated. 3. Mild diffuse bronchial wall thickening with very mild centrilobular and paraseptal emphysema;  imaging findings suggestive of underlying COPD. 4. Hepatic steatosis.   Aortic Atherosclerosis and Emphysema    Recent labs: 03/15/2022: Glucose 128, BUN/Cr 14/1/05. EGFR 77. Na/K 142/4.4. Rest of the CMP normal H/H  14/42. MCV 92. Platelets 233 HbA1C 6.1% Chol 139, TG 92, HDL 53, LDL 68   Review of Systems  Cardiovascular:  Positive for leg swelling. Negative for chest pain, dyspnea on exertion, palpitations and syncope.  Respiratory:  Negative for shortness of breath.          Vitals:   06/23/22 1301  BP: 131/70  Pulse: 75  SpO2: 95%      Body mass index is 49.79 kg/m. Filed Weights   06/23/22 1301  Weight: (!) 347 lb (157.4 kg)     Objective:   Physical Exam Vitals and nursing note reviewed.  Constitutional:      General: He is not in acute distress. Neck:     Vascular: No JVD.  Cardiovascular:     Rate and Rhythm: Normal rate and regular rhythm.     Heart sounds: Normal heart sounds. No murmur heard. Pulmonary:     Effort: Pulmonary effort is normal.     Breath sounds: Normal breath sounds. No wheezing or rales.  Musculoskeletal:     Right lower leg: Edema (1+) present.     Left lower leg: Edema (1+) present.        ICD-10-CM   1. Mixed hyperlipidemia  E78.2 EKG 12-Lead    2. Essential hypertension  I10     3. Leg edema  R60.0 PCV ECHOCARDIOGRAM COMPLETE    Pro b natriuretic peptide (BNP)[id number])          Assessment & Recommendations:   67 y.o. Caucasian male with controlled hypertension, hyperlipidemia, tobacco dependence, CAD with exertional dyspnea, abnormal stress test, morbid obesity, venous insufficiency  CAD: Mostly medial calcification on coronary angiogram with no significant obstructive coronary artery disease seen. Okay to stop Aspirin.  Continue statin Continue regular exercise.  Leg edema: Suspect HFpEF. Will check echocardiogram, proBNP. Ideally, would like to start him on Jardiance. However, given recent initiation of Ozempic, I would like to hold off for now. Change lasix 40 mg daily to torsemide 20 mg daily.   Hypertension: Controlled  F/u in 6 months.   [name], MD [facility] [place] Pager: [phone] Office: [phone]

## 2022-06-27 ENCOUNTER — Ambulatory Visit: Payer: Medicare Other

## 2022-06-27 DIAGNOSIS — R600 Localized edema: Secondary | ICD-10-CM

## 2022-06-28 LAB — PRO B NATRIURETIC PEPTIDE: NT-Pro BNP: <36 pg/mL (ref 0–376)

## 2022-07-05 DIAGNOSIS — N35911 Unspecified urethral stricture, male, meatal: Secondary | ICD-10-CM | POA: Insufficient documentation

## 2022-07-20 ENCOUNTER — Telehealth: Payer: Self-pay | Admitting: Pharmacist

## 2022-07-20 NOTE — Progress Notes (Signed)
Care Management & Coordination Services Pharmacy Team   Reason for Encounter: Medication coordination and delivery   Contacted patient to discuss medications and coordinate delivery from [facility]. Called and spoke to patient and confirmed medications and delivery date and time. Cycle dispensing form sent to Erskine Emery, CPP for review.   Last adherence delivery date: 06/29/22      Patient is due for next adherence delivery on: 07/31/22  This delivery to include: Vials  30 Days   Torsemide 20mg  Atorvastatin 20mg  pantoprazole 40mg  Lisinopril 20mg  metoprolol tar25mg   Sildenafil 50mg  Breztri inhaler    Ozempic 8mg /44mL  Patient declined the following medications this month: None  No refill request needed.  Confirmed delivery date of 07/31/22, advised patient that pharmacy will contact them the morning of delivery.   Any concerns about your medications? No  How often do you forget or accidentally miss a dose? Rarely  Do you use a pillbox? No  Is patient in packaging No  If yes  What is the date on your next pill pack?  Any concerns or issues with your packaging?   Medications: Outpatient Encounter Medications as of 07/20/2022  Medication Sig   atorvastatin (LIPITOR) 20 MG tablet Take 1 tablet (20 mg total) by mouth daily.   Budeson-Glycopyrrol-Formoterol (BREZTRI AEROSPHERE) 160-9-4.8 MCG/ACT AERO Inhale 2 puffs into the lungs 2 (two) times daily.   lisinopril (ZESTRIL) 20 MG tablet TAKE ONE TABLET BY MOUTH ONCE DAILY   metoprolol tartrate (LOPRESSOR) 25 MG tablet TAKE 1/2 TABLET BY MOUTH TWICE DAILY   nitroGLYCERIN (NITROSTAT) 0.4 MG SL tablet Place 1 tablet (0.4 mg total) under the tongue every 5 (five) minutes as needed for chest pain.   pantoprazole (PROTONIX) 40 MG tablet Take 1 tablet (40 mg total) by mouth daily.   Semaglutide, 2 MG/DOSE, 8 MG/3ML SOPN Inject 2 mg as directed once a week.   sildenafil (VIAGRA) 50 MG tablet Take 50 mg by mouth daily as  needed.   torsemide (DEMADEX) 20 MG tablet Take 1 tablet (20 mg total) by mouth daily.   No facility-administered encounter medications on file as of 07/20/2022.   BP Readings from Last 3 Encounters:  06/23/22 131/70  03/23/22 128/72  11/11/21 118/66    Pulse Readings from Last 3 Encounters:  06/23/22 75  03/23/22 70  11/11/21 65    Lab Results  Component Value Date/Time   HGBA1C 6.1 (H) 03/15/2022 09:08 AM   HGBA1C 5.6 11/11/2021 09:19 AM   Lab Results  Component Value Date   CREATININE 1.05 03/15/2022   BUN 14 03/15/2022   GFRNONAA >60 12/08/2020   GFRAA 94 02/02/2020   NA 142 03/15/2022   K 4.4 03/15/2022   CALCIUM 9.3 03/15/2022   CO2 26 03/15/2022     Future Appointments  Date Time Provider Department Center  08/03/2022 11:00 AM Patwardhan, Anabel Bene, MD [facility] None  11/09/2022  2:30 PM Earlene Plater, Rita Ohara, RPH [facility] None    [employer], [address]

## 2022-08-01 ENCOUNTER — Ambulatory Visit (INDEPENDENT_AMBULATORY_CARE_PROVIDER_SITE_OTHER): Payer: Medicare Other | Admitting: Family Medicine

## 2022-08-01 VITALS — BP 124/72 | HR 76 | Temp 98.4°F | Ht 70.0 in | Wt 332.0 lb

## 2022-08-01 DIAGNOSIS — K219 Gastro-esophageal reflux disease without esophagitis: Secondary | ICD-10-CM | POA: Diagnosis not present

## 2022-08-01 NOTE — Progress Notes (Signed)
Wt Readings from Last 3 Encounters:  08/01/22 (!) 332 lb (150.6 kg)  06/23/22 (!) 347 lb (157.4 kg)  04/26/22 (!) 359 lb (162.8 kg)     Subjective:    Patient ID: James Branch, male    DOB: 1955/05/24, 67 y.o.   MRN: [id number] Patient is on Ozempic 2 mg subcu weekly for weight loss.  He has lost about 27 pounds over the last several months.  However he reports severe reflux which is uncontrolled.  He states that he can feel fluid and occasionally food coming up in his throat that he has to spit out.  He reports burning acid reflux in the center of his chest despite taking pantoprazole 40 mg a day. Past Medical History:  Diagnosis Date   Arthritis    knees   Asthma    CAD (coronary artery disease)    COPD (chronic obstructive pulmonary disease)    Diastolic dysfunction    GERD (gastroesophageal reflux disease)    Hyperlipidemia    Hypertension    Obesity    OSA on CPAP    ahi-84, 18 CWP   Sleep apnea    wears cpap    Smoker    Umbilical hernia    Past Surgical History:  Procedure Laterality Date   CARDIAC CATHETERIZATION     COLONOSCOPY  2007   INSERTION OF MESH N/A 12/16/2020   Procedure: INSERTION OF MESH;  Surgeon: Abigail Miyamoto, MD;  Location: [facility] OR;  Service: General;  Laterality: N/A;   LEFT HEART CATH AND CORONARY ANGIOGRAPHY N/A 10/12/2020   Procedure: LEFT HEART CATH AND CORONARY ANGIOGRAPHY;  Surgeon: [name], MD;  Location: [facility];  Service: Cardiovascular;  Laterality: N/A;   TONSILLECTOMY  1967   UMBILICAL HERNIA REPAIR N/A 12/16/2020   Procedure: UMBILICAL HERNIA REPAIR WITH MESH;  Surgeon: Abigail Miyamoto, MD;  Location: [facility] OR;  Service: General;  Laterality: N/A;  LMA     Allergies  Allergen Reactions   Penicillins Swelling   Social History   Socioeconomic History   Marital status: Married    Spouse name: [name]   Number of children: 4   Years of education: Not on file   Highest education level: Associate degree:  occupational, Scientist, product/process development, or vocational program  Occupational History   Occupation: Retired  Tobacco Use   Smoking status: Former    Packs/day: 0.50    Years: 20.00    Additional pack years: 0.00    Total pack years: 10.00    Types: Cigarettes    Quit date: 02/17/2019    Years since quitting: 3.4   Smokeless tobacco: Never  Vaping Use   Vaping Use: Never used  Substance and Sexual Activity   Alcohol use: No    Alcohol/week: 0.0 standard drinks of alcohol   Drug use: No   Sexual activity: Yes    Birth control/protection: Condom  Other Topics Concern   Not on file  Social History Narrative   4 children, 18 grandchildren.   Social Determinants of Health   Financial Resource Strain: Low Risk  (08/01/2022)   Overall Financial Resource Strain (CARDIA)    Difficulty of Paying Living Expenses: Not hard at all  Food Insecurity: No Food Insecurity (08/01/2022)   Hunger Vital Sign    Worried About Running Out of Food in the Last Year: Never true    Ran Out of Food in the Last Year: Never true  Transportation Needs: No Transportation Needs (08/01/2022)   PRAPARE -  Administrator, Civil Service (Medical): No    Lack of Transportation (Non-Medical): No  Physical Activity: Insufficiently Active (08/01/2022)   Exercise Vital Sign    Days of Exercise per Week: 4 days    Minutes of Exercise per Session: 30 min  Stress: No Stress Concern Present (08/01/2022)   [employer] of Occupational Health - Occupational Stress Questionnaire    Feeling of Stress : Not at all  Social Connections: Unknown (08/01/2022)   Social Connection and Isolation Panel [NHANES]    Frequency of Communication with Friends and Family: Twice a week    Frequency of Social Gatherings with Friends and Family: Twice a week    Attends Religious Services: Patient declined    Database administrator or Organizations: No    Attends Engineer, structural: More than 4 times per year    Marital Status:  Married  Catering manager Violence: Not At Risk (04/26/2022)   Humiliation, Afraid, Rape, and Kick questionnaire    Fear of Current or Ex-Partner: No    Emotionally Abused: No    Physically Abused: No    Sexually Abused: No      Review of Systems  All other systems reviewed and are negative.      Objective:   Physical Exam Vitals reviewed.  Constitutional:      General: He is not in acute distress.    Appearance: Normal appearance. He is obese. He is not ill-appearing or toxic-appearing.  Cardiovascular:     Rate and Rhythm: Normal rate and regular rhythm.     Heart sounds: Normal heart sounds. No murmur heard. Pulmonary:     Effort: Pulmonary effort is normal. No accessory muscle usage or respiratory distress.     Breath sounds: Decreased breath sounds present. No wheezing, rhonchi or rales.  Abdominal:     General: Bowel sounds are normal. There is no distension.     Palpations: Abdomen is soft.  Musculoskeletal:     Right lower leg: No edema.     Left lower leg: No edema.  Neurological:     Mental Status: He is alert.           Assessment & Plan:  Gastroesophageal reflux disease, unspecified whether esophagitis present I believe Ozempic is exacerbating his esophageal reflux.  We discussed decreasing the dose of the Ozempic however he states that he believes he is losing weight due to dietary changes that he has made and not due to the Ozempic.  Therefore he prefers to stop the medicine altogether.  He is already on pantoprazole 40 mg a day.  Discontinue Ozempic and reassess symptoms in 2 to 4 weeks.  If not improving, consider an EGD to evaluate for any evidence of obstruction

## 2022-08-03 ENCOUNTER — Ambulatory Visit: Payer: Medicare Other | Admitting: Cardiology

## 2022-08-03 ENCOUNTER — Encounter: Payer: Self-pay | Admitting: Cardiology

## 2022-08-03 VITALS — BP 100/58 | HR 71 | Resp 16 | Ht 70.0 in | Wt 331.0 lb

## 2022-08-03 DIAGNOSIS — E782 Mixed hyperlipidemia: Secondary | ICD-10-CM

## 2022-08-03 DIAGNOSIS — I25118 Atherosclerotic heart disease of native coronary artery with other forms of angina pectoris: Secondary | ICD-10-CM

## 2022-08-03 DIAGNOSIS — I10 Essential (primary) hypertension: Secondary | ICD-10-CM

## 2022-08-03 MED ORDER — TORSEMIDE 20 MG PO TABS: 20.0000 mg | ORAL_TABLET | ORAL | 0 refills | Status: DC | PRN

## 2022-08-03 NOTE — Progress Notes (Signed)
Patient referred by Donita Brooks, MD for coronary artery calcification.  Subjective:   James Branch, male    DOB: 03/09/1956, 67 y.o.   MRN: [id number]   Chief Complaint  Patient presents with   Edema   Follow-up    HPI  67 y.o. Caucasian male with controlled hypertension, hyperlipidemia, tobacco dependence, CAD with exertional dyspnea, abnormal stress test, morbid obesity, venous insufficiency  Patient is doing well. He denies chest pain, shortness of breath, palpitations, leg edema, orthopnea, PND, TIA/syncope. Leg edema is improved. Reviewed recent test results with the patient, details below.    Current Outpatient Medications:    atorvastatin (LIPITOR) 20 MG tablet, Take 1 tablet (20 mg total) by mouth daily., Disp: 90 tablet, Rfl: 0   Budeson-Glycopyrrol-Formoterol (BREZTRI AEROSPHERE) 160-9-4.8 MCG/ACT AERO, Inhale 2 puffs into the lungs 2 (two) times daily., Disp: 10.7 g, Rfl: 11   lisinopril (ZESTRIL) 20 MG tablet, TAKE ONE TABLET BY MOUTH ONCE DAILY, Disp: 90 tablet, Rfl: 3   metoprolol tartrate (LOPRESSOR) 25 MG tablet, TAKE 1/2 TABLET BY MOUTH TWICE DAILY, Disp: 60 tablet, Rfl: 3   nitroGLYCERIN (NITROSTAT) 0.4 MG SL tablet, Place 1 tablet (0.4 mg total) under the tongue every 5 (five) minutes as needed for chest pain., Disp: 30 tablet, Rfl: 1   pantoprazole (PROTONIX) 40 MG tablet, Take 1 tablet (40 mg total) by mouth daily., Disp: 90 tablet, Rfl: 0   Semaglutide, 2 MG/DOSE, 8 MG/3ML SOPN, Inject 2 mg as directed once a week., Disp: 3 mL, Rfl: 3   sildenafil (VIAGRA) 50 MG tablet, Take 50 mg by mouth daily as needed., Disp: , Rfl:    torsemide (DEMADEX) 20 MG tablet, Take 1 tablet (20 mg total) by mouth daily., Disp: 30 tablet, Rfl: 3    Cardiovascular studies:  EKG 06/23/2022: Sinus rhythm 72 bpm  Low voltage   Echocardiogram 06/27/2022: Left ventricle cavity is normal in size and wall thickness. Normal LV systolic function with EF 63%. Normal global  wall motion. Normal diastolic filling pattern. No significant valvular abnormality. IVC not well visualized.  Coronary angiography 10/12/2020:  Left dominant circulation Mild calcific disease 20% in prox-mid LAD LVEDP 20 mmHg  Dyspnea most likely due to obesity and elevated LVEDP Okay to stop Aspirin Okay to proceed with umbilical hernia surgery    Ultrasound abdominal aorta 02/17/2019: Abdominal Aorta: No evidence of an abdominal aortic aneurysm was visualized. Very limited exam and unable to do a thorough exam of the aorta and iliac arteries.  Stenosis: Very limited exam. From what was seen there is no evidence of stenosis.  Can not rule out stenosis on segments not visualized.  CT chest 02/11/2019: 1. Lung-RADS 2S, benign appearance or behavior. Continue annual screening with low-dose chest CT without contrast in 12 months. 2. The "S" modifier above refers to potentially clinically significant non lung cancer related findings.  Specifically, there is aortic atherosclerosis, in addition to left main and 3 vessel coronary artery disease. Please note that although the presence of coronary artery calcium  documents the presence of coronary artery disease, the severity of this disease and any potential stenosis cannot be assessed on this non-gated CT examination. Assessment for potential risk factor modification,  dietary therapy or pharmacologic therapy may be warranted, if clinically indicated. 3. Mild diffuse bronchial wall thickening with very mild centrilobular and paraseptal emphysema;  imaging findings suggestive of underlying COPD. 4. Hepatic steatosis.   Aortic Atherosclerosis and Emphysema    Recent labs: 06/27/2022:  NT proBNP <36  03/15/2022: Glucose 128, BUN/Cr 14/1/05. EGFR 77. Na/K 142/4.4. Rest of the CMP normal H/H 14/42. MCV 92. Platelets 233 HbA1C 6.1% Chol 139, TG 92, HDL 53, LDL 68   Review of Systems  Cardiovascular:  Positive for leg swelling. Negative  for chest pain, dyspnea on exertion, palpitations and syncope.  Respiratory:  Negative for shortness of breath.          Vitals:   08/03/22 1157  BP: (!) 100/58  Pulse: 71  Resp: 16  SpO2: 96%     Body mass index is 47.49 kg/m. Filed Weights   08/03/22 1157  Weight: (!) 331 lb (150.1 kg)     Objective:   Physical Exam Vitals and nursing note reviewed.  Constitutional:      General: He is not in acute distress. Neck:     Vascular: No JVD.  Cardiovascular:     Rate and Rhythm: Normal rate and regular rhythm.     Heart sounds: Normal heart sounds. No murmur heard. Pulmonary:     Effort: Pulmonary effort is normal.     Breath sounds: Normal breath sounds. No wheezing or rales.  Musculoskeletal:     Right lower leg: No edema.     Left lower leg: No edema.        ICD-10-CM   1. Mixed hyperlipidemia  E78.2     2. Essential hypertension  I10     3. Coronary artery disease involving native coronary artery of native heart with other form of angina pectoris  I25.118            Assessment & Recommendations:   67 y.o. Caucasian male with controlled hypertension, hyperlipidemia, tobacco dependence, CAD with exertional dyspnea, abnormal stress test, morbid obesity, venous insufficiency  CAD: Mostly medial calcification on coronary angiogram with no significant obstructive coronary artery disease seen. Okay to omit Aspirin.  Continue statin Continue regular exercise.  Leg edema: No HFpEF based on echocardiogram and normal proBNP. I recommend leg edema was due to venous insufficiency. Recommend regular use of compression stockings. Can take torsemide 20 mg only as needed.  Hypertension: Controlled  F/u in 1 year   [name], MD Pager: [phone] Office: [phone] '

## 2022-08-12 ENCOUNTER — Other Ambulatory Visit: Payer: Self-pay | Admitting: Family Medicine

## 2022-08-14 DIAGNOSIS — N368 Other specified disorders of urethra: Secondary | ICD-10-CM | POA: Diagnosis not present

## 2022-08-14 DIAGNOSIS — N35911 Unspecified urethral stricture, male, meatal: Secondary | ICD-10-CM | POA: Diagnosis not present

## 2022-08-14 NOTE — Telephone Encounter (Signed)
Requested Prescriptions  Pending Prescriptions Disp Refills   OZEMPIC, 2 MG/DOSE, 8 MG/3ML SOPN [Pharmacy Med Name: Ozempic 2 mg/dose (8 mg/3 mL) subcutaneous pen injector] 3 mL 0    Sig: Inject TWO MG as directed ONCE A WEEK.     Endocrinology:  Diabetes - GLP-1 Receptor Agonists - semaglutide Failed - 08/12/2022  8:03 AM      Failed - HBA1C in normal range and within 180 days    Hgb A1c MFr Bld  Date Value Ref Range Status  03/15/2022 6.1 (H) <5.7 % of total Hgb Final    Comment:    For someone without known diabetes, a hemoglobin  A1c value between 5.7% and 6.4% is consistent with prediabetes and should be confirmed with a  follow-up test. . For someone with known diabetes, a value <7% indicates that their diabetes is well controlled. A1c targets should be individualized based on duration of diabetes, age, comorbid conditions, and other considerations. . This assay result is consistent with an increased risk of diabetes. . Currently, no consensus exists regarding use of hemoglobin A1c for diagnosis of diabetes for children. .          Failed - Valid encounter within last 6 months    Recent Outpatient Visits           1 year ago Dysuria   [facility] [name], Priscille Heidelberg, MD   1 year ago Diarrhea, unspecified type   [facility] Tanya Nones, Priscille Heidelberg, MD   1 year ago Sore throat   [facility] Valentino Nose, NP   1 year ago Encounter for [employer] annual wellness exam   [facility] Tanya Nones, Priscille Heidelberg, MD   1 year ago Morbid obesity [facility])   [facility] [name], Priscille Heidelberg, MD              Passed - Cr in normal range and within 360 days    Creat  Date Value Ref Range Status  03/15/2022 1.05 0.70 - 1.35 mg/dL Final   Creatinine, Urine  Date Value Ref Range Status  03/15/2022 218 20 - 320 mg/dL Final

## 2022-08-17 ENCOUNTER — Telehealth: Payer: Self-pay | Admitting: Pharmacist

## 2022-08-17 ENCOUNTER — Other Ambulatory Visit: Payer: Self-pay

## 2022-08-17 DIAGNOSIS — K219 Gastro-esophageal reflux disease without esophagitis: Secondary | ICD-10-CM

## 2022-08-17 DIAGNOSIS — E782 Mixed hyperlipidemia: Secondary | ICD-10-CM

## 2022-08-17 MED ORDER — PANTOPRAZOLE SODIUM 40 MG PO TBEC: 40.0000 mg | DELAYED_RELEASE_TABLET | Freq: Every day | ORAL | 1 refills | Status: DC

## 2022-08-17 MED ORDER — ATORVASTATIN CALCIUM 20 MG PO TABS: 20.0000 mg | ORAL_TABLET | Freq: Every day | ORAL | 1 refills | Status: DC

## 2022-08-17 NOTE — Progress Notes (Signed)
Care Management & Coordination Services Pharmacy Team  Reason for Encounter: Medication coordination and delivery  Contacted patient to discuss medications and coordinate delivery from [facility]. Spoke with family on 08/17/2022  Cycle dispensing form sent to Erskine Emery for review.   Last adherence delivery date: 07/31/2022      Patient is due for next adherence delivery on: 08/29/2022  This delivery to include: Vials  30 Days  Atorvastatin 20 mg daily Furosemide 40 mg daily Metoprolol tartrate 25 mg twice daily  Lisinopril 20 mg daily Pantoprazole 40 mg daily Sildenafil 50 mg as directed Breztri inhaler 2 puffs twice daily  Refills requested from providers include: Atorvastatin 20 mg daily Pantoprazole 40 mg daily  Confirmed delivery date of 08/29/2022, advised patient that pharmacy will contact them the morning of delivery.   Any concerns about your medications? No  How often do you forget or accidentally miss a dose? Rarely  Is patient in packaging No  If yes  What is the date on your next pill pack?  Any concerns or issues with your packaging?   Chart review: Recent office visits:  08/01/2022 OV  (PCP) Donita Brooks, MD; Discontinue Ozempic and reassess symptoms in 2 to 4 weeks. If not improving, consider an EGD to evaluate for any evidence of obstruction   Recent consult visits:  08/14/2022 OV (Urology) Guy Sandifer Sharren Bridge, MD; no medication changes indicated.  08/03/2022 OV (Cardiology) [name], MD; Can take torsemide 20 mg only as needed.   Hospital visits:  None in previous 6 months  Medications: Outpatient Encounter Medications as of 08/17/2022  Medication Sig   atorvastatin (LIPITOR) 20 MG tablet Take 1 tablet (20 mg total) by mouth daily.   Budeson-Glycopyrrol-Formoterol (BREZTRI AEROSPHERE) 160-9-4.8 MCG/ACT AERO Inhale 2 puffs into the lungs 2 (two) times daily.   lisinopril (ZESTRIL) 20 MG tablet TAKE ONE TABLET BY MOUTH ONCE  DAILY   metoprolol tartrate (LOPRESSOR) 25 MG tablet TAKE 1/2 TABLET BY MOUTH TWICE DAILY   nitroGLYCERIN (NITROSTAT) 0.4 MG SL tablet Place 1 tablet (0.4 mg total) under the tongue every 5 (five) minutes as needed for chest pain.   pantoprazole (PROTONIX) 40 MG tablet Take 1 tablet (40 mg total) by mouth daily.   Semaglutide, 2 MG/DOSE, (OZEMPIC, 2 MG/DOSE,) 8 MG/3ML SOPN Inject TWO MG as directed ONCE A WEEK.   sildenafil (VIAGRA) 50 MG tablet Take 50 mg by mouth daily as needed.   torsemide (DEMADEX) 20 MG tablet Take 1 tablet (20 mg total) by mouth as needed.   No facility-administered encounter medications on file as of 08/17/2022.   BP Readings from Last 3 Encounters:  08/03/22 (!) 100/58  08/01/22 124/72  06/23/22 131/70    Pulse Readings from Last 3 Encounters:  08/03/22 71  08/01/22 76  06/23/22 75    Lab Results  Component Value Date/Time   HGBA1C 6.1 (H) 03/15/2022 09:08 AM   HGBA1C 5.6 11/11/2021 09:19 AM   Lab Results  Component Value Date   CREATININE 1.05 03/15/2022   BUN 14 03/15/2022   GFRNONAA >60 12/08/2020   GFRAA 94 02/02/2020   NA 142 03/15/2022   K 4.4 03/15/2022   CALCIUM 9.3 03/15/2022   CO2 26 03/15/2022     Future Appointments  Date Time Provider Department Center  11/09/2022  2:30 PM Erroll Luna, [facility] None  07/30/2023  1:00 PM Patwardhan, Anabel Bene, MD [facility] None   April D Calhoun, [facility] Clinical Pharmacist Assistant [phone]

## 2022-09-28 ENCOUNTER — Encounter: Payer: Self-pay | Admitting: Family Medicine

## 2022-09-28 ENCOUNTER — Ambulatory Visit (INDEPENDENT_AMBULATORY_CARE_PROVIDER_SITE_OTHER): Payer: Medicare Other | Admitting: Family Medicine

## 2022-09-28 VITALS — BP 128/64 | HR 66 | Temp 98.1°F | Ht 70.0 in | Wt 342.8 lb

## 2022-09-28 DIAGNOSIS — I872 Venous insufficiency (chronic) (peripheral): Secondary | ICD-10-CM | POA: Diagnosis not present

## 2022-09-28 MED ORDER — MOMETASONE FUROATE 0.1 % EX CREA: TOPICAL_CREAM | Freq: Every day | CUTANEOUS | 1 refills | Status: DC

## 2022-09-28 NOTE — Progress Notes (Signed)
Subjective:    Patient ID: James Branch, male    DOB: [birth year], 67 y.o.   MRN: [id number] Patient has a history of swelling in his legs, chronic venous insufficiency and varicose veins.  Recently he has developed a slight erythematous rash on the anterior surfaces of both shins associated with +1 pitting edema.  He states that it also itches from his shins to his knees bilaterally.  He has been using cortisone cream and this has helped.  Erythema is very faint today and difficult to discern.  There is no visible rash today on the leg specifically there is no vesicles, scale, papules, or macules.  There is a slight hyperpigmentation to his anterior shins bilaterally Past Medical History:  Diagnosis Date   Arthritis    knees   Asthma    CAD (coronary artery disease)    COPD (chronic obstructive pulmonary disease) (HCC)    Diastolic dysfunction    GERD (gastroesophageal reflux disease)    Hyperlipidemia    Hypertension    Obesity    OSA on CPAP    ahi-84, 18 CWP   Sleep apnea    wears cpap    Smoker    Umbilical hernia    Past Surgical History:  Procedure Laterality Date   CARDIAC CATHETERIZATION     COLONOSCOPY  2007   INSERTION OF MESH N/A 12/16/2020   Procedure: INSERTION OF MESH;  Surgeon: Abigail Miyamoto, MD;  Location: [facility] OR;  Service: General;  Laterality: N/A;   LEFT HEART CATH AND CORONARY ANGIOGRAPHY N/A 10/12/2020   Procedure: LEFT HEART CATH AND CORONARY ANGIOGRAPHY;  Surgeon: [name], MD;  Location: [facility] INVASIVE CV LAB;  Service: Cardiovascular;  Laterality: N/A;   TONSILLECTOMY  1967   UMBILICAL HERNIA REPAIR N/A 12/16/2020   Procedure: UMBILICAL HERNIA REPAIR WITH MESH;  Surgeon: Abigail Miyamoto, MD;  Location[facility] OR;  Service: General;  Laterality: N/A;  LMA     Allergies  Allergen Reactions   Penicillins Swelling   Social History   Socioeconomic History   Marital status: Married    Spouse name: [name]   Number of children: 4   Years of  education: Not on file   Highest education level: Associate degree: occupational, Scientist, product/process development, or vocational program  Occupational History   Occupation: Retired  Tobacco Use   Smoking status: Former    Packs/day: 0.50    Years: 20.00    Additional pack years: 0.00    Total pack years: 10.00    Types: Cigarettes    Quit date: 02/17/2019    Years since quitting: 3.6   Smokeless tobacco: Never  Vaping Use   Vaping Use: Never used  Substance and Sexual Activity   Alcohol use: No    Alcohol/week: 0.0 standard drinks of alcohol   Drug use: No   Sexual activity: Yes    Birth control/protection: Condom  Other Topics Concern   Not on file  Social History Narrative   4 children, 18 grandchildren.   Social Determinants of Health   Financial Resource Strain: Low Risk  (08/01/2022)   Overall Financial Resource Strain (CARDIA)    Difficulty of Paying Living Expenses: Not hard at all  Food Insecurity: No Food Insecurity (08/01/2022)   Hunger Vital Sign    Worried About Running Out of Food in the Last Year: Never true    Ran Out of Food in the Last Year: Never true  Transportation Needs: No Transportation Needs (08/01/2022)   PRAPARE -  Administrator, Civil Service (Medical): No    Lack of Transportation (Non-Medical): No  Physical Activity: Insufficiently Active (08/01/2022)   Exercise Vital Sign    Days of Exercise per Week: 4 days    Minutes of Exercise per Session: 30 min  Stress: No Stress Concern Present (08/01/2022)   [employer] of Occupational Health - Occupational Stress Questionnaire    Feeling of Stress : Not at all  Social Connections: Unknown (08/01/2022)   Social Connection and Isolation Panel [NHANES]    Frequency of Communication with Friends and Family: Twice a week    Frequency of Social Gatherings with Friends and Family: Twice a week    Attends Religious Services: Patient declined    Database administrator or Organizations: No    Attends Museum/gallery exhibitions officer: More than 4 times per year    Marital Status: Married  Catering manager Violence: Not At Risk (04/26/2022)   Humiliation, Afraid, Rape, and Kick questionnaire    Fear of Current or Ex-Partner: No    Emotionally Abused: No    Physically Abused: No    Sexually Abused: No      Review of Systems  All other systems reviewed and are negative.      Objective:   Physical Exam Vitals reviewed.  Constitutional:      General: He is not in acute distress.    Appearance: Normal appearance. He is obese. He is not ill-appearing or toxic-appearing.  Cardiovascular:     Rate and Rhythm: Normal rate and regular rhythm.     Heart sounds: Normal heart sounds. No murmur heard. Pulmonary:     Effort: Pulmonary effort is normal. No accessory muscle usage or respiratory distress.     Breath sounds: Decreased breath sounds present. No wheezing, rhonchi or rales.  Abdominal:     General: Bowel sounds are normal. There is no distension.     Palpations: Abdomen is soft.  Musculoskeletal:     Right lower leg: No edema.     Left lower leg: No edema.  Neurological:     Mental Status: He is alert.           Assessment & Plan:  Venous stasis dermatitis of both lower extremities Visibly, there is no significant rash.  There is some postinflammatory hyperpigmentation on both shins.  He does have trace bipedal edema today.  I believe that he likely has venous stasis dermatitis due to his varicose veins.  I recommended using his diuretic as needed to control the swelling and then using mometasone cream applied once daily as needed for itching

## 2022-10-16 ENCOUNTER — Other Ambulatory Visit: Payer: Self-pay | Admitting: Cardiology

## 2022-11-02 ENCOUNTER — Encounter: Payer: Self-pay | Admitting: Pharmacist

## 2022-11-02 NOTE — Progress Notes (Signed)
Patient previously followed by [facility] pharmacist. Per clinical review, no pharmacist appointment needed at this time.

## 2022-11-09 ENCOUNTER — Encounter: Payer: Medicare Other | Admitting: Pharmacist

## 2022-11-14 ENCOUNTER — Other Ambulatory Visit: Payer: Self-pay | Admitting: Family Medicine

## 2022-11-14 NOTE — Telephone Encounter (Signed)
Requested Prescriptions  Pending Prescriptions Disp Refills   mometasone (ELOCON) 0.1 % cream [Pharmacy Med Name: mometasone 0.1 % topical cream] 30 g 1    Sig: APPLY TOPICALLY DAILY AS DIRECTED     Off-Protocol Failed - 11/14/2022  8:00 AM      Failed - Medication not assigned to a protocol, review manually.      Failed - Valid encounter within last 12 months    Recent Outpatient Visits           1 year ago Dysuria   [facility] [name], Priscille Heidelberg, MD   1 year ago Diarrhea, unspecified type   [facility] [name], Priscille Heidelberg, MD   1 year ago Sore throat   [facility] Valentino Nose, NP   1 year ago Encounter for [employer] annual wellness exam   [facility] Donita Brooks, MD   1 year ago Morbid obesity (HCC)   [facility] [name], Priscille Heidelberg, MD       Future Appointments             In 8 months Patwardhan, Anabel Bene, MD [facility]             lisinopril (ZESTRIL) 20 MG tablet [Pharmacy Med Name: lisinopril 20 mg tablet] 90 tablet 0    Sig: TAKE ONE TABLET BY MOUTH ONCE DAILY     Cardiovascular:  ACE Inhibitors Failed - 11/14/2022  8:00 AM      Failed - Cr in normal range and within 180 days    Creat  Date Value Ref Range Status  03/15/2022 1.05 0.70 - 1.35 mg/dL Final   Creatinine, Urine  Date Value Ref Range Status  03/15/2022 218 20 - 320 mg/dL Final         Failed - K in normal range and within 180 days    Potassium  Date Value Ref Range Status  03/15/2022 4.4 3.5 - 5.3 mmol/L Final         Failed - Valid encounter within last 6 months    Recent Outpatient Visits           1 year ago Dysuria   [facility] Donita Brooks, MD   1 year ago Diarrhea, unspecified type   [facility] Donita Brooks, MD   1 year ago Sore throat   [facility] Valentino Nose, NP   1 year ago Encounter for  Medicare annual wellness exam   [facility] [name], Priscille Heidelberg, MD   1 year ago Morbid obesity [facility])   [facility] [name], Priscille Heidelberg, MD       Future Appointments             In 8 months Patwardhan, Anabel Bene, MD [facility]            Passed - Patient is not pregnant      Passed - Last BP in normal range    BP Readings from Last 1 Encounters:  09/28/22 128/64

## 2022-11-14 NOTE — Telephone Encounter (Signed)
Requested medication (s) are due for refill today: routing for review  Requested medication (s) are on the active medication list: yes  Last refill:  09/28/22  Future visit scheduled: no  Notes to clinic:  Unable to refill per protocol, cannot delegate.      Requested Prescriptions  Pending Prescriptions Disp Refills   mometasone (ELOCON) 0.1 % cream [Pharmacy Med Name: mometasone 0.1 % topical cream] 30 g 1    Sig: APPLY TOPICALLY DAILY AS DIRECTED     Off-Protocol Failed - 11/14/2022  8:00 AM      Failed - Medication not assigned to a protocol, review manually.      Failed - Valid encounter within last 12 months    Recent Outpatient Visits           1 year ago Dysuria   [facility] Tanya Nones, Priscille Heidelberg, MD   1 year ago Diarrhea, unspecified type   [facility] Tanya Nones, Priscille Heidelberg, MD   1 year ago Sore throat   [facility] Valentino Nose, NP   1 year ago Encounter for [employer] annual wellness exam   [facility] [name], Priscille Heidelberg, MD   1 year ago Morbid obesity [facility])   [facility] [name], Priscille Heidelberg, MD       Future Appointments             In 8 months Patwardhan, Anabel Bene, MD [facility]            Signed Prescriptions Disp Refills   lisinopril (ZESTRIL) 20 MG tablet 90 tablet 0    Sig: TAKE ONE TABLET BY MOUTH ONCE DAILY     Cardiovascular:  ACE Inhibitors Failed - 11/14/2022  8:00 AM      Failed - Cr in normal range and within 180 days    Creat  Date Value Ref Range Status  03/15/2022 1.05 0.70 - 1.35 mg/dL Final   Creatinine, Urine  Date Value Ref Range Status  03/15/2022 218 20 - 320 mg/dL Final         Failed - K in normal range and within 180 days    Potassium  Date Value Ref Range Status  03/15/2022 4.4 3.5 - 5.3 mmol/L Final         Failed - Valid encounter within last 6 months    Recent Outpatient Visits           1 year ago Dysuria    [facility] Donita Brooks, MD   1 year ago Diarrhea, unspecified type   [facility] Donita Brooks, MD   1 year ago Sore throat   [facility] Valentino Nose, NP   1 year ago Encounter for Medicare annual wellness exam   [facility] [name], Priscille Heidelberg, MD   1 year ago Morbid obesity [facility])   [facility] [name], Priscille Heidelberg, MD       Future Appointments             In 8 months Patwardhan, Anabel Bene, MD [facility]            Passed - Patient is not pregnant      Passed - Last BP in normal range    BP Readings from Last 1 Encounters:  09/28/22 128/64

## 2022-11-20 ENCOUNTER — Encounter: Payer: Self-pay | Admitting: Family Medicine

## 2022-11-21 DIAGNOSIS — R6889 Other general symptoms and signs: Secondary | ICD-10-CM | POA: Diagnosis not present

## 2022-12-06 ENCOUNTER — Encounter: Payer: Self-pay | Admitting: Cardiology

## 2022-12-06 NOTE — Telephone Encounter (Signed)
From patient.

## 2022-12-07 NOTE — Telephone Encounter (Signed)
It is possible that some of what happened may have been due to dehydration. Be sude to drink 2-3L water/day. Hold metoprolol for now. Keep BP long and arrange office visit with me at next available.   Thanks MJP

## 2022-12-08 ENCOUNTER — Other Ambulatory Visit: Payer: Medicare Other

## 2022-12-08 DIAGNOSIS — R7309 Other abnormal glucose: Secondary | ICD-10-CM | POA: Diagnosis not present

## 2022-12-08 DIAGNOSIS — I10 Essential (primary) hypertension: Secondary | ICD-10-CM

## 2022-12-08 DIAGNOSIS — E782 Mixed hyperlipidemia: Secondary | ICD-10-CM

## 2022-12-08 DIAGNOSIS — J441 Chronic obstructive pulmonary disease with (acute) exacerbation: Secondary | ICD-10-CM

## 2022-12-09 LAB — COMPLETE METABOLIC PANEL WITH GFR
AG Ratio: 1.5 (calc) (ref 1.0–2.5)
ALT: 23 U/L (ref 9–46)
AST: 23 U/L (ref 10–35)
Albumin: 4.3 g/dL (ref 3.6–5.1)
Alkaline phosphatase (APISO): 86 U/L (ref 35–144)
BUN/Creatinine Ratio: 21 (calc) (ref 6–22)
BUN: 31 mg/dL — ABNORMAL HIGH (ref 7–25)
CO2: 22 mmol/L (ref 20–32)
Calcium: 9.9 mg/dL (ref 8.6–10.3)
Chloride: 99 mmol/L (ref 98–110)
Creat: 1.46 mg/dL — ABNORMAL HIGH (ref 0.70–1.35)
Globulin: 2.8 g/dL (ref 1.9–3.7)
Glucose, Bld: 96 mg/dL (ref 65–99)
Potassium: 4.5 mmol/L (ref 3.5–5.3)
Sodium: 136 mmol/L (ref 135–146)
Total Bilirubin: 0.8 mg/dL (ref 0.2–1.2)
Total Protein: 7.1 g/dL (ref 6.1–8.1)
eGFR: 52 mL/min/{1.73_m2} — ABNORMAL LOW (ref >=60)

## 2022-12-09 LAB — CBC WITH DIFFERENTIAL/PLATELET
Absolute Monocytes: 577 {cells}/uL (ref 200–950)
Basophils Absolute: 47 cells/uL (ref 0–200)
Basophils Relative: 0.6 %
Eosinophils Absolute: 117 {cells}/uL (ref 15–500)
Eosinophils Relative: 1.5 %
HCT: 42.6 % (ref 38.5–50.0)
Hemoglobin: 14.6 g/dL (ref 13.2–17.1)
Lymphs Abs: 2363 {cells}/uL (ref 850–3900)
MCH: 31.7 pg (ref 27.0–33.0)
MCHC: 34.3 g/dL (ref 32.0–36.0)
MCV: 92.4 fL (ref 80.0–100.0)
MPV: 12.4 fL (ref 7.5–12.5)
Monocytes Relative: 7.4 %
Neutro Abs: 4696 {cells}/uL (ref 1500–7800)
Neutrophils Relative %: 60.2 %
Platelets: 258 10*3/uL (ref 140–400)
RBC: 4.61 10*6/uL (ref 4.20–5.80)
RDW: 12.6 % (ref 11.0–15.0)
Total Lymphocyte: 30.3 %
WBC: 7.8 10*3/uL (ref 3.8–10.8)

## 2022-12-09 LAB — HEMOGLOBIN A1C
Hgb A1c MFr Bld: 6 %{Hb} — ABNORMAL HIGH (ref <5.7)
Mean Plasma Glucose: 126 mg/dL
eAG (mmol/L): 7 mmol/L

## 2022-12-09 LAB — LIPID PANEL
Cholesterol: 133 mg/dL (ref <200)
HDL: 47 mg/dL (ref >=40)
LDL Cholesterol (Calc): 66 mg/dL
Non-HDL Cholesterol (Calc): 86 mg/dL (ref <130)
Total CHOL/HDL Ratio: 2.8 (calc) (ref <5.0)
Triglycerides: 114 mg/dL (ref <150)

## 2022-12-11 ENCOUNTER — Other Ambulatory Visit: Payer: Self-pay

## 2022-12-11 DIAGNOSIS — E782 Mixed hyperlipidemia: Secondary | ICD-10-CM

## 2022-12-20 ENCOUNTER — Other Ambulatory Visit: Payer: Medicare Other

## 2022-12-20 DIAGNOSIS — I10 Essential (primary) hypertension: Secondary | ICD-10-CM

## 2022-12-20 DIAGNOSIS — I5189 Other ill-defined heart diseases: Secondary | ICD-10-CM | POA: Diagnosis not present

## 2022-12-21 ENCOUNTER — Ambulatory Visit: Payer: Medicare Other | Admitting: Cardiology

## 2022-12-21 ENCOUNTER — Encounter: Payer: Self-pay | Admitting: Cardiology

## 2022-12-21 VITALS — BP 126/63 | HR 63 | Resp 16 | Ht 70.0 in | Wt 328.0 lb

## 2022-12-21 DIAGNOSIS — I10 Essential (primary) hypertension: Secondary | ICD-10-CM | POA: Diagnosis not present

## 2022-12-21 DIAGNOSIS — I25118 Atherosclerotic heart disease of native coronary artery with other forms of angina pectoris: Secondary | ICD-10-CM | POA: Diagnosis not present

## 2022-12-21 LAB — BASIC METABOLIC PANEL
BUN: 13 mg/dL (ref 7–25)
CO2: 22 mmol/L (ref 20–32)
Calcium: 9.4 mg/dL (ref 8.6–10.3)
Chloride: 107 mmol/L (ref 98–110)
Creat: 1.05 mg/dL (ref 0.70–1.35)
Glucose, Bld: 100 mg/dL — ABNORMAL HIGH (ref 65–99)
Potassium: 4.2 mmol/L (ref 3.5–5.3)
Sodium: 141 mmol/L (ref 135–146)

## 2022-12-21 NOTE — Progress Notes (Signed)
Patient referred by Donita Brooks, MD for coronary artery calcification.  Subjective:   James Branch, male    DOB: 1955-10-03, 67 y.o.   MRN: [id number]   Chief Complaint  Patient presents with   Medication Management   Follow-up    HPI  67 y.o. Caucasian male with controlled hypertension, hyperlipidemia, coronary calcification, morbid obesity, venous insufficiency  In 11/2022, patient had an episode of lightheadedness, noted to have BP 60s/40s. He stopped metoprolol since then. Prior to this, he was taking torsemide 20 mg daily, as opposed to just as needed. He has stopped torsemide since then. Reviewed lab results since then.     Current Outpatient Medications:    atorvastatin (LIPITOR) 20 MG tablet, Take 1 tablet (20 mg total) by mouth daily., Disp: 90 tablet, Rfl: 1   Budeson-Glycopyrrol-Formoterol (BREZTRI AEROSPHERE) 160-9-4.8 MCG/ACT AERO, Inhale 2 puffs into the lungs 2 (two) times daily., Disp: 10.7 g, Rfl: 11   lisinopril (ZESTRIL) 20 MG tablet, TAKE ONE TABLET BY MOUTH ONCE DAILY, Disp: 90 tablet, Rfl: 0   nitroGLYCERIN (NITROSTAT) 0.4 MG SL tablet, Place 1 tablet (0.4 mg total) under the tongue every 5 (five) minutes as needed for chest pain., Disp: 30 tablet, Rfl: 1   sildenafil (VIAGRA) 50 MG tablet, Take 50 mg by mouth daily as needed., Disp: , Rfl:    metoprolol tartrate (LOPRESSOR) 25 MG tablet, TAKE 1/2 TABLET BY MOUTH TWICE DAILY (Patient not taking: Reported on 12/21/2022), Disp: 180 tablet, Rfl: 3   mometasone (ELOCON) 0.1 % cream, APPLY TOPICALLY DAILY AS DIRECTED, Disp: 30 g, Rfl: 1   torsemide (DEMADEX) 20 MG tablet, TAKE ONE TABLET BY MOUTH ONCE DAILY (Patient not taking: Reported on 12/21/2022), Disp: 90 tablet, Rfl: 3    Cardiovascular studies:  EKG 06/23/2022: Sinus rhythm 72 bpm  Low voltage   Echocardiogram 06/27/2022: Left ventricle cavity is normal in size and wall thickness. Normal LV systolic function with EF 63%. Normal global wall  motion. Normal diastolic filling pattern. No significant valvular abnormality. IVC not well visualized.  Coronary angiography 10/12/2020:  Left dominant circulation Mild calcific disease 20% in prox-mid LAD LVEDP 20 mmHg  Dyspnea most likely due to obesity and elevated LVEDP Okay to stop Aspirin Okay to proceed with umbilical hernia surgery    Ultrasound abdominal aorta 02/17/2019: Abdominal Aorta: No evidence of an abdominal aortic aneurysm was visualized. Very limited exam and unable to do a thorough exam of the aorta and iliac arteries.  Stenosis: Very limited exam. From what was seen there is no evidence of stenosis.  Can not rule out stenosis on segments not visualized.  CT chest 02/11/2019: 1. Lung-RADS 2S, benign appearance or behavior. Continue annual screening with low-dose chest CT without contrast in 12 months. 2. The "S" modifier above refers to potentially clinically significant non lung cancer related findings.  Specifically, there is aortic atherosclerosis, in addition to left main and 3 vessel coronary artery disease. Please note that although the presence of coronary artery calcium  documents the presence of coronary artery disease, the severity of this disease and any potential stenosis cannot be assessed on this non-gated CT examination. Assessment for potential risk factor modification,  dietary therapy or pharmacologic therapy may be warranted, if clinically indicated. 3. Mild diffuse bronchial wall thickening with very mild centrilobular and paraseptal emphysema;  imaging findings suggestive of underlying COPD. 4. Hepatic steatosis.   Aortic Atherosclerosis and Emphysema    Recent labs: 12/20/2022: Glucose 100, BUN/Cr 13/1.05. EGFR  NA. Na/K 141/4.2  12/08/2022: Glucose 96, BUN/Cr 31/1.46. EGFR 52 Na/K 136/4.5. Rest of the CMP normal H/H 14/42. MCV 92. Platelets 258 HbA1C 6.0% Chol 133, TG 114, HDL 47, LDL 66  06/27/2022: NT proBNP <36   Review of  Systems  Cardiovascular:  Negative for chest pain, dyspnea on exertion, leg swelling, palpitations and syncope.  Respiratory:  Negative for shortness of breath.          Vitals:   12/21/22 1157  BP: 126/63  Pulse: 63  Resp: 16  SpO2: 96%      Body mass index is 47.06 kg/m. Filed Weights   12/21/22 1157  Weight: (!) 328 lb (148.8 kg)      Objective:   Physical Exam Vitals and nursing note reviewed.  Constitutional:      General: He is not in acute distress. Neck:     Vascular: No JVD.  Cardiovascular:     Rate and Rhythm: Normal rate and regular rhythm.     Heart sounds: Normal heart sounds. No murmur heard. Pulmonary:     Effort: Pulmonary effort is normal.     Breath sounds: Normal breath sounds. No wheezing or rales.  Musculoskeletal:     Right lower leg: No edema.     Left lower leg: No edema.     Visit diagnoses:    ICD-10-CM   1. Coronary artery disease involving native coronary artery of native heart with other form of angina pectoris (HCC)  I25.118     2. Essential hypertension  I10             Assessment & Recommendations:   67 y.o. Caucasian male with controlled hypertension, hyperlipidemia, coronary calcification, morbid obesity, venous insufficiency  CAD: Mostly medial calcification on coronary angiogram with no significant obstructive coronary artery disease seen. Okay to omit Aspirin.  Continue statin Agree with stopping metoprolol given no clear indication, and recent episode of lightheadedness while on metoprolol.  Leg edema: Resolved. No HFpEF based on echocardiogram and normal proBNP. Does not need torsemide, especially given recent dehydration and lightheadedness episode.  Hypertension: Controlled  F/u as needed.    [name], MD Pager: [phone] Office: [phone] '

## 2022-12-26 ENCOUNTER — Other Ambulatory Visit (HOSPITAL_BASED_OUTPATIENT_CLINIC_OR_DEPARTMENT_OTHER): Payer: Self-pay

## 2022-12-26 MED ORDER — COMIRNATY 30 MCG/0.3ML IM SUSY
0.3000 mL | PREFILLED_SYRINGE | Freq: Once | INTRAMUSCULAR | 0 refills | Status: AC
  Filled 2022-12-26: qty 0.3, 1d supply, fill #0

## 2022-12-26 MED ORDER — FLUAD 0.5 ML IM SUSY
0.5000 mL | PREFILLED_SYRINGE | Freq: Once | INTRAMUSCULAR | 0 refills | Status: AC
  Filled 2022-12-26: qty 0.5, 1d supply, fill #0

## 2022-12-28 DIAGNOSIS — G4733 Obstructive sleep apnea (adult) (pediatric): Secondary | ICD-10-CM | POA: Diagnosis not present

## 2023-01-08 ENCOUNTER — Other Ambulatory Visit: Payer: Self-pay | Admitting: Family Medicine

## 2023-01-08 DIAGNOSIS — E782 Mixed hyperlipidemia: Secondary | ICD-10-CM

## 2023-01-09 NOTE — Telephone Encounter (Signed)
Change in pharmacy OV 09/28/22 Requested Prescriptions  Pending Prescriptions Disp Refills   lisinopril (ZESTRIL) 20 MG tablet [Pharmacy Med Name: LISINOPRIL 20 MG TABLET 20 Tablet] 30 tablet 10    Sig: TAKE 1 TABLET BY MOUTH ONCE DAILY     Cardiovascular:  ACE Inhibitors Failed - 01/08/2023  2:26 PM      Failed - Valid encounter within last 6 months    Recent Outpatient Visits           1 year ago Dysuria   [facility] Donita Brooks, MD   1 year ago Diarrhea, unspecified type   [facility] [name], Priscille Heidelberg, MD   1 year ago Sore throat   [facility] Valentino Nose, NP   1 year ago Encounter for [employer] annual wellness exam   [facility] Donita Brooks, MD   1 year ago Morbid obesity (HCC)   [facility] Donita Brooks, MD       Future Appointments             In 6 months Patwardhan, Anabel Bene, MD [facility], [facility]            Passed - Cr in normal range and within 180 days    Creat  Date Value Ref Range Status  12/20/2022 1.05 0.70 - 1.35 mg/dL Final   Creatinine, Urine  Date Value Ref Range Status  03/15/2022 218 20 - 320 mg/dL Final         Passed - K in normal range and within 180 days    Potassium  Date Value Ref Range Status  12/20/2022 4.2 3.5 - 5.3 mmol/L Final         Passed - Patient is not pregnant      Passed - Last BP in normal range    BP Readings from Last 1 Encounters:  12/21/22 126/63          atorvastatin (LIPITOR) 20 MG tablet [Pharmacy Med Name: ATORVASTATIN 20MG  TAB 20 Tablet] 30 tablet 10    Sig: TAKE 1 TABLET BY MOUTH ONCE DAILY     Cardiovascular:  Antilipid - Statins Failed - 01/08/2023  2:26 PM      Failed - Valid encounter within last 12 months    Recent Outpatient Visits           1 year ago Dysuria   [facility] Donita Brooks, MD   1 year ago Diarrhea, unspecified type    [facility] Donita Brooks, MD   1 year ago Sore throat   [facility] Valentino Nose, NP   1 year ago Encounter for Medicare annual wellness exam   [facility] Tanya Nones, Priscille Heidelberg, MD   1 year ago Morbid obesity [facility])   [facility] [name], Priscille Heidelberg, MD       Future Appointments             In 6 months Patwardhan, Anabel Bene, MD [facility] at [facility], [facility]            Failed - Lipid Panel in normal range within the last 12 months    Cholesterol  Date Value Ref Range Status  12/08/2022 133 <200 mg/dL Final   LDL Cholesterol (Calc)  Date Value Ref Range Status  12/08/2022 66 mg/dL (calc) Final  Comment:    Reference range: <100 . Desirable range <100 mg/dL for primary prevention;   <70 mg/dL for patients with CHD or diabetic patients  with > or = 2 CHD risk factors. Marland Kitchen LDL-C is now calculated using the Martin-Hopkins  calculation, which is a validated novel method providing  better accuracy than the Friedewald equation in the  estimation of LDL-C.  Horald Pollen et al. Lenox Ahr. 7846;962(95): 2061-2068  (http://education.[facility].com/faq/FAQ164)    HDL  Date Value Ref Range Status  12/08/2022 47 > OR = 40 mg/dL Final   Triglycerides  Date Value Ref Range Status  12/08/2022 114 <150 mg/dL Final         Passed - Patient is not pregnant

## 2023-01-24 DIAGNOSIS — H2513 Age-related nuclear cataract, bilateral: Secondary | ICD-10-CM | POA: Diagnosis not present

## 2023-01-25 ENCOUNTER — Other Ambulatory Visit: Payer: Self-pay | Admitting: Family Medicine

## 2023-01-25 MED ORDER — ESOMEPRAZOLE MAGNESIUM 40 MG PO CPDR: 40.0000 mg | DELAYED_RELEASE_CAPSULE | Freq: Every day | ORAL | 3 refills | Status: DC

## 2023-02-01 ENCOUNTER — Telehealth: Payer: Self-pay

## 2023-02-01 ENCOUNTER — Other Ambulatory Visit: Payer: Self-pay

## 2023-02-01 DIAGNOSIS — K219 Gastro-esophageal reflux disease without esophagitis: Secondary | ICD-10-CM

## 2023-02-01 MED ORDER — ESOMEPRAZOLE MAGNESIUM 40 MG PO CPDR
40.0000 mg | DELAYED_RELEASE_CAPSULE | Freq: Every day | ORAL | 3 refills | Status: DC
  Filled 2023-08-21 – 2024-01-22 (×2): qty 100, 100d supply, fill #0

## 2023-02-01 NOTE — Telephone Encounter (Signed)
Pt's wife called in to check on status of this refill esomeprazole (NEXIUM) 40 MG capsule [[id number]]. Pt's wife stated that no longer use [facility] pharmacy for refills. All refills need to be sent to [facility]. Please advise  Cb#: [phone]

## 2023-02-21 DIAGNOSIS — N35911 Unspecified urethral stricture, male, meatal: Secondary | ICD-10-CM | POA: Diagnosis not present

## 2023-02-24 DIAGNOSIS — N35914 Unspecified anterior urethral stricture, male: Secondary | ICD-10-CM | POA: Insufficient documentation

## 2023-03-09 DIAGNOSIS — J449 Chronic obstructive pulmonary disease, unspecified: Secondary | ICD-10-CM | POA: Diagnosis not present

## 2023-03-09 DIAGNOSIS — Z9989 Dependence on other enabling machines and devices: Secondary | ICD-10-CM | POA: Diagnosis not present

## 2023-03-09 DIAGNOSIS — E785 Hyperlipidemia, unspecified: Secondary | ICD-10-CM | POA: Diagnosis not present

## 2023-03-09 DIAGNOSIS — N35914 Unspecified anterior urethral stricture, male: Secondary | ICD-10-CM | POA: Diagnosis not present

## 2023-03-09 DIAGNOSIS — I10 Essential (primary) hypertension: Secondary | ICD-10-CM | POA: Diagnosis not present

## 2023-03-09 DIAGNOSIS — G4730 Sleep apnea, unspecified: Secondary | ICD-10-CM | POA: Diagnosis not present

## 2023-03-09 DIAGNOSIS — K219 Gastro-esophageal reflux disease without esophagitis: Secondary | ICD-10-CM | POA: Diagnosis not present

## 2023-03-28 ENCOUNTER — Other Ambulatory Visit: Payer: Medicare Other

## 2023-03-28 DIAGNOSIS — Z125 Encounter for screening for malignant neoplasm of prostate: Secondary | ICD-10-CM

## 2023-03-28 DIAGNOSIS — I10 Essential (primary) hypertension: Secondary | ICD-10-CM | POA: Diagnosis not present

## 2023-03-28 DIAGNOSIS — E782 Mixed hyperlipidemia: Secondary | ICD-10-CM

## 2023-03-29 LAB — CBC WITH DIFFERENTIAL/PLATELET
Absolute Lymphocytes: 2299 {cells}/uL (ref 850–3900)
Absolute Monocytes: 465 {cells}/uL (ref 200–950)
Basophils Absolute: 66 {cells}/uL (ref 0–200)
Basophils Relative: 0.8 %
Eosinophils Absolute: 382 {cells}/uL (ref 15–500)
Eosinophils Relative: 4.6 %
HCT: 44.4 % (ref 38.5–50.0)
Hemoglobin: 14.5 g/dL (ref 13.2–17.1)
MCH: 30.3 pg (ref 27.0–33.0)
MCHC: 32.7 g/dL (ref 32.0–36.0)
MCV: 92.9 fL (ref 80.0–100.0)
MPV: 12 fL (ref 7.5–12.5)
Monocytes Relative: 5.6 %
Neutro Abs: 5088 {cells}/uL (ref 1500–7800)
Neutrophils Relative %: 61.3 %
Platelets: 248 10*3/uL (ref 140–400)
RBC: 4.78 10*6/uL (ref 4.20–5.80)
RDW: 13 % (ref 11.0–15.0)
Total Lymphocyte: 27.7 %
WBC: 8.3 10*3/uL (ref 3.8–10.8)

## 2023-03-29 LAB — COMPLETE METABOLIC PANEL WITH GFR
AG Ratio: 1.5 (calc) (ref 1.0–2.5)
ALT: 19 U/L (ref 9–46)
AST: 13 U/L (ref 10–35)
Albumin: 4.1 g/dL (ref 3.6–5.1)
Alkaline phosphatase (APISO): 89 U/L (ref 35–144)
BUN: 14 mg/dL (ref 7–25)
CO2: 24 mmol/L (ref 20–32)
Calcium: 9.6 mg/dL (ref 8.6–10.3)
Chloride: 106 mmol/L (ref 98–110)
Creat: 1.04 mg/dL (ref 0.70–1.35)
Globulin: 2.7 g/dL (ref 1.9–3.7)
Glucose, Bld: 105 mg/dL — ABNORMAL HIGH (ref 65–99)
Potassium: 4.6 mmol/L (ref 3.5–5.3)
Sodium: 140 mmol/L (ref 135–146)
Total Bilirubin: 0.6 mg/dL (ref 0.2–1.2)
Total Protein: 6.8 g/dL (ref 6.1–8.1)
eGFR: 78 mL/min/{1.73_m2} (ref >=60)

## 2023-03-29 LAB — LIPID PANEL
Cholesterol: 128 mg/dL (ref <200)
HDL: 53 mg/dL (ref >=40)
LDL Cholesterol (Calc): 57 mg/dL
Non-HDL Cholesterol (Calc): 75 mg/dL (ref <130)
Total CHOL/HDL Ratio: 2.4 (calc) (ref <5.0)
Triglycerides: 93 mg/dL (ref <150)

## 2023-03-29 LAB — PSA: PSA: 0.34 ng/mL (ref <=4.00)

## 2023-03-30 DIAGNOSIS — G4733 Obstructive sleep apnea (adult) (pediatric): Secondary | ICD-10-CM | POA: Diagnosis not present

## 2023-04-02 ENCOUNTER — Encounter: Payer: Self-pay | Admitting: Family Medicine

## 2023-04-02 ENCOUNTER — Ambulatory Visit (INDEPENDENT_AMBULATORY_CARE_PROVIDER_SITE_OTHER): Payer: Medicare Other | Admitting: Family Medicine

## 2023-04-02 VITALS — BP 124/78 | HR 60 | Temp 98.1°F | Ht 70.0 in | Wt 334.0 lb

## 2023-04-02 DIAGNOSIS — Z0001 Encounter for general adult medical examination with abnormal findings: Secondary | ICD-10-CM | POA: Diagnosis not present

## 2023-04-02 DIAGNOSIS — M5412 Radiculopathy, cervical region: Secondary | ICD-10-CM

## 2023-04-02 DIAGNOSIS — I5189 Other ill-defined heart diseases: Secondary | ICD-10-CM

## 2023-04-02 DIAGNOSIS — Z122 Encounter for screening for malignant neoplasm of respiratory organs: Secondary | ICD-10-CM | POA: Diagnosis not present

## 2023-04-02 DIAGNOSIS — I10 Essential (primary) hypertension: Secondary | ICD-10-CM | POA: Diagnosis not present

## 2023-04-02 DIAGNOSIS — Z0000 Encounter for general adult medical examination without abnormal findings: Secondary | ICD-10-CM

## 2023-04-02 MED ORDER — MECLIZINE HCL 25 MG PO TABS: 25.0000 mg | ORAL_TABLET | Freq: Three times a day (TID) | ORAL | 0 refills | Status: DC | PRN

## 2023-04-02 NOTE — Progress Notes (Signed)
Subjective:    Patient ID: James Branch, male    DOB: October 30, 1955, 67 y.o.   MRN: [id number] Patient is a very pleasant 67 year old Caucasian gentleman with a remote history of smoking.  He quit smoking 5 years ago.  He is due for a CT scan to screen for lung cancer.  His colonoscopy is up-to-date.  His prostate cancer screening was recently performed using a PSA which was normal.  His immunizations are all up-to-date including his RSV vaccine.  He denies any falls or depression or memory loss.  His blood pressure is excellent.  His most recent lab work is listed below. Immunization History  Administered Date(s) Administered   Fluad Quad(high Dose 65+) 01/26/2021, 01/20/2022   Fluad Trivalent(High Dose 65+) 12/26/2022   Influenza,inj,Quad PF,6+ Mos 03/14/2017, 03/04/2018, 01/31/2019, 02/05/2020   Influenza-Unspecified 02/28/2016, 02/24/2017   PFIZER(Purple Top)SARS-COV-2 Vaccination 05/23/2019, 06/17/2019, 01/22/2020, 10/29/2020   PNEUMOCOCCAL CONJUGATE-20 01/26/2021   Pfizer Covid-19 Vaccine Bivalent Booster 24yrs & up 02/10/2021   Pfizer(Comirnaty)Fall Seasonal Vaccine 12 years and older 02/08/2022, 04/15/2022, 12/26/2022   Pneumococcal Polysaccharide-23 01/31/2019   Tdap 10/26/2014   Zoster Recombinant(Shingrix) 03/04/2021, 06/10/2021   Zoster, Live 03/28/2016    Lab on 03/28/2023  Component Date Value Ref Range Status   WBC 03/28/2023 8.3  3.8 - 10.8 Thousand/uL Final   RBC 03/28/2023 4.78  4.20 - 5.80 Million/uL Final   Hemoglobin 03/28/2023 14.5  13.2 - 17.1 g/dL Final   HCT 40/98/1191 44.4  38.5 - 50.0 % Final   MCV 03/28/2023 92.9  80.0 - 100.0 fL Final   MCH 03/28/2023 30.3  27.0 - 33.0 pg Final   MCHC 03/28/2023 32.7  32.0 - 36.0 g/dL Final   Comment: For adults, a slight decrease in the calculated MCHC value (in the range of 30 to 32 g/dL) is most likely not clinically significant; however, it should be interpreted with caution in correlation with other red cell  parameters and the patient's clinical condition.    RDW 03/28/2023 13.0  11.0 - 15.0 % Final   Platelets 03/28/2023 248  140 - 400 Thousand/uL Final   MPV 03/28/2023 12.0  7.5 - 12.5 fL Final   Neutro Abs 03/28/2023 5,088  1,500 - 7,800 cells/uL Final   Absolute Lymphocytes 03/28/2023 2,299  850 - 3,900 cells/uL Final   Absolute Monocytes 03/28/2023 465  200 - 950 cells/uL Final   Eosinophils Absolute 03/28/2023 382  15 - 500 cells/uL Final   Basophils Absolute 03/28/2023 66  0 - 200 cells/uL Final   Neutrophils Relative % 03/28/2023 61.3  % Final   Total Lymphocyte 03/28/2023 27.7  % Final   Monocytes Relative 03/28/2023 5.6  % Final   Eosinophils Relative 03/28/2023 4.6  % Final   Basophils Relative 03/28/2023 0.8  % Final   Glucose, Bld 03/28/2023 105 (H)  65 - 99 mg/dL Final   Comment: .            Fasting reference interval . For someone without known diabetes, a glucose value between 100 and 125 mg/dL is consistent with prediabetes and should be confirmed with a follow-up test. .    BUN 03/28/2023 14  7 - 25 mg/dL Final   Creat 47/82/9562 1.04  0.70 - 1.35 mg/dL Final   eGFR 13/11/6576 78  > OR = 60 mL/min/1.84m2 Final   BUN/Creatinine Ratio 03/28/2023 SEE NOTE:  6 - 22 (calc) Final   Comment:    Not Reported: BUN and Creatinine are within  reference range. .    Sodium 03/28/2023 140  135 - 146 mmol/L Final   Potassium 03/28/2023 4.6  3.5 - 5.3 mmol/L Final   Chloride 03/28/2023 106  98 - 110 mmol/L Final   CO2 03/28/2023 24  20 - 32 mmol/L Final   Calcium 03/28/2023 9.6  8.6 - 10.3 mg/dL Final   Total Protein 13/11/6576 6.8  6.1 - 8.1 g/dL Final   Albumin 46/96/2952 4.1  3.6 - 5.1 g/dL Final   Globulin 84/13/2440 2.7  1.9 - 3.7 g/dL (calc) Final   AG Ratio 03/28/2023 1.5  1.0 - 2.5 (calc) Final   Total Bilirubin 03/28/2023 0.6  0.2 - 1.2 mg/dL Final   Alkaline phosphatase (APISO) 03/28/2023 89  35 - 144 U/L Final   AST 03/28/2023 13  10 - 35 U/L Final   ALT  03/28/2023 19  9 - 46 U/L Final   Cholesterol 03/28/2023 128  <200 mg/dL Final   HDL 02/11/2535 53  > OR = 40 mg/dL Final   Triglycerides 64/40/3474 93  <150 mg/dL Final   LDL Cholesterol (Calc) 03/28/2023 57  mg/dL (calc) Final   Comment: Reference range: <100 . Desirable range <100 mg/dL for primary prevention;   <70 mg/dL for patients with CHD or diabetic patients  with > or = 2 CHD risk factors. Marland Kitchen LDL-C is now calculated using the Martin-Hopkins  calculation, which is a validated novel method providing  better accuracy than the Friedewald equation in the  estimation of LDL-C.  Horald Pollen et al. Lenox Ahr. 2595;638(75): 2061-2068  (http://education.[facility].com/faq/FAQ164)    Total CHOL/HDL Ratio 03/28/2023 2.4  <6.4 (calc) Final   Non-HDL Cholesterol (Calc) 03/28/2023 75  <130 mg/dL (calc) Final   Comment: For patients with diabetes plus 1 major ASCVD risk  factor, treating to a non-HDL-C goal of <100 mg/dL  (LDL-C of <33 mg/dL) is considered a therapeutic  option.    PSA 03/28/2023 0.34  < OR = 4.00 ng/mL Final   Comment: The total PSA value from this assay system is  standardized against the WHO standard. The test  result will be approximately 20% lower when compared  to the equimolar-standardized total PSA (Beckman  Coulter). Comparison of serial PSA results should be  interpreted with this fact in mind. . This test was performed using the Siemens  chemiluminescent method. Values obtained from  different assay methods cannot be used interchangeably. PSA levels, regardless of value, should not be interpreted as absolute evidence of the presence or absence of disease.     Past Medical History:  Diagnosis Date   Arthritis    knees   Asthma    CAD (coronary artery disease)    COPD (chronic obstructive pulmonary disease) (HCC)    Diastolic dysfunction    GERD (gastroesophageal reflux disease)    Hyperlipidemia    Hypertension    Obesity    OSA on CPAP     ahi-84, 18 CWP   Sleep apnea    wears cpap    Smoker    Umbilical hernia    Past Surgical History:  Procedure Laterality Date   CARDIAC CATHETERIZATION     COLONOSCOPY  2007   INSERTION OF MESH N/A 12/16/2020   Procedure: INSERTION OF MESH;  Surgeon: Abigail Miyamoto, MD;  Location: [facility] OR;  Service: General;  Laterality: N/A;   LEFT HEART CATH AND CORONARY ANGIOGRAPHY N/A 10/12/2020   Procedure: LEFT HEART CATH AND CORONARY ANGIOGRAPHY;  Surgeon: [name], MD;  Location: [facility]  INVASIVE CV LAB;  Service: Cardiovascular;  Laterality: N/A;   TONSILLECTOMY  1967   UMBILICAL HERNIA REPAIR N/A 12/16/2020   Procedure: UMBILICAL HERNIA REPAIR WITH MESH;  Surgeon: Abigail Miyamoto, MD;  Location: [facility] OR;  Service: General;  Laterality: N/A;  LMA     Allergies  Allergen Reactions   Penicillins Swelling   Social History   Socioeconomic History   Marital status: Married    Spouse name: [name]   Number of children: 4   Years of education: Not on file   Highest education level: Some college, no degree  Occupational History   Occupation: Retired  Tobacco Use   Smoking status: Former    Current packs/day: 0.00    Average packs/day: 0.5 packs/day for 20.0 years (10.0 ttl pk-yrs)    Types: Cigarettes    Start date: 02/17/1999    Quit date: 02/17/2019    Years since quitting: 4.1   Smokeless tobacco: Never  Vaping Use   Vaping status: Never Used  Substance and Sexual Activity   Alcohol use: No    Alcohol/week: 0.0 standard drinks of alcohol   Drug use: No   Sexual activity: Yes    Birth control/protection: Condom  Other Topics Concern   Not on file  Social History Narrative   4 children, 18 grandchildren.   Social Drivers of Corporate investment banker Strain: Low Risk  (03/29/2023)   Overall Financial Resource Strain (CARDIA)    Difficulty of Paying Living Expenses: Not very hard  Food Insecurity: No Food Insecurity (03/29/2023)   Hunger Vital Sign    Worried About  Running Out of Food in the Last Year: Never true    Ran Out of Food in the Last Year: Never true  Transportation Needs: No Transportation Needs (03/29/2023)   PRAPARE - Administrator, Civil Service (Medical): No    Lack of Transportation (Non-Medical): No  Physical Activity: Sufficiently Active (03/29/2023)   Exercise Vital Sign    Days of Exercise per Week: 4 days    Minutes of Exercise per Session: 40 min  Stress: No Stress Concern Present (03/29/2023)   [employer] of Occupational Health - Occupational Stress Questionnaire    Feeling of Stress : Not at all  Social Connections: Moderately Integrated (03/29/2023)   Social Connection and Isolation Panel [NHANES]    Frequency of Communication with Friends and Family: Twice a week    Frequency of Social Gatherings with Friends and Family: Once a week    Attends Religious Services: Never    Database administrator or Organizations: No    Attends Engineer, structural: More than 4 times per year    Marital Status: Married  Catering manager Violence: Not At Risk (04/26/2022)   Humiliation, Afraid, Rape, and Kick questionnaire    Fear of Current or Ex-Partner: No    Emotionally Abused: No    Physically Abused: No    Sexually Abused: No      Review of Systems  All other systems reviewed and are negative.      Objective:   Physical Exam Vitals reviewed.  Constitutional:      General: He is not in acute distress.    Appearance: Normal appearance. He is obese. He is not ill-appearing or toxic-appearing.  Cardiovascular:     Rate and Rhythm: Normal rate and regular rhythm.     Heart sounds: Normal heart sounds. No murmur heard. Pulmonary:     Effort: Pulmonary effort is  normal. No accessory muscle usage or respiratory distress.     Breath sounds: Decreased breath sounds present. No wheezing, rhonchi or rales.  Abdominal:     General: Bowel sounds are normal. There is no distension.     Palpations:  Abdomen is soft.  Musculoskeletal:     Right lower leg: No edema.     Left lower leg: No edema.  Neurological:     Mental Status: He is alert.         .Screening for lung cancer - Plan: CT CHEST LUNG CA SCREEN LOW DOSE W/O CM  Right cervical radiculopathy - Plan: DG Cervical Spine Complete  Morbid obesity (HCC)  Essential hypertension  Diastolic dysfunction  General medical exam Physical exam today is significant only for obesity.  Continue to recommend diet exercise and weight loss.  Blood pressure is well-controlled.  Lab work is excellent except for a mild elevation in his fasting blood sugar.  Recommended a low-carb diet.  Immunizations are up-to-date.  Colon cancer screening is up-to-date.  Prostate cancer screening is up-to-date.  I will schedule the patient for a CT scan of the lung to screen for lung cancer given his history of tobacco abuse.  The patient also reports pain in the right side of his neck radiating down his right arm into his right shoulder and into the right tricep area.  There is no relationship of the pain to range of motion.  It occurs primarily when sitting.  The pain sounds neuropathic in nature.  He states that it has been there for more than 2 months.  Start by obtaining an x-ray of the cervical spine.

## 2023-04-03 ENCOUNTER — Other Ambulatory Visit: Payer: Self-pay | Admitting: Family Medicine

## 2023-04-09 ENCOUNTER — Other Ambulatory Visit: Payer: Self-pay

## 2023-04-09 DIAGNOSIS — Z87891 Personal history of nicotine dependence: Secondary | ICD-10-CM

## 2023-04-09 DIAGNOSIS — Z122 Encounter for screening for malignant neoplasm of respiratory organs: Secondary | ICD-10-CM

## 2023-04-14 ENCOUNTER — Encounter (HOSPITAL_COMMUNITY): Payer: Self-pay

## 2023-04-14 ENCOUNTER — Ambulatory Visit (INDEPENDENT_AMBULATORY_CARE_PROVIDER_SITE_OTHER): Payer: Medicare Other

## 2023-04-14 ENCOUNTER — Other Ambulatory Visit: Payer: Self-pay

## 2023-04-14 ENCOUNTER — Ambulatory Visit (HOSPITAL_COMMUNITY)
Admission: RE | Admit: 2023-04-14 | Discharge: 2023-04-14 | Disposition: A | Discharge status: Home / Self Care | Payer: Medicare Other | Source: Ambulatory Visit | Attending: Emergency Medicine | Admitting: Emergency Medicine

## 2023-04-14 VITALS — BP 115/66 | HR 80 | Temp 98.2°F | Resp 20

## 2023-04-14 DIAGNOSIS — R052 Subacute cough: Secondary | ICD-10-CM | POA: Diagnosis not present

## 2023-04-14 DIAGNOSIS — R0602 Shortness of breath: Secondary | ICD-10-CM | POA: Diagnosis not present

## 2023-04-14 DIAGNOSIS — J069 Acute upper respiratory infection, unspecified: Secondary | ICD-10-CM | POA: Diagnosis not present

## 2023-04-14 DIAGNOSIS — J441 Chronic obstructive pulmonary disease with (acute) exacerbation: Secondary | ICD-10-CM | POA: Diagnosis not present

## 2023-04-14 DIAGNOSIS — R059 Cough, unspecified: Secondary | ICD-10-CM | POA: Diagnosis not present

## 2023-04-14 LAB — POC COVID19/FLU A&B COMBO
Covid Antigen, POC: NEGATIVE
Influenza A Antigen, POC: NEGATIVE
Influenza B Antigen, POC: NEGATIVE

## 2023-04-14 MED ORDER — IPRATROPIUM BROMIDE 0.03 % NA SOLN: 1.0000 | Freq: Two times a day (BID) | NASAL | 12 refills | Status: DC

## 2023-04-14 MED ORDER — PREDNISONE 10 MG PO TABS: ORAL_TABLET | ORAL | 0 refills | Status: DC

## 2023-04-14 MED ORDER — AZITHROMYCIN 250 MG PO TABS: 250.0000 mg | ORAL_TABLET | Freq: Every day | ORAL | 0 refills | Status: DC

## 2023-04-14 NOTE — Discharge Instructions (Addendum)
You have COPD exacerbation associated to a virus illness.  We have ordered a chest xray to ruled out pneumonia.  Take medications as ordered. Salt water gargles Tylenol 1000mg  three times a day for generalized body aches, sore throat and fever Ensure hydration Consider adding on Zinc and Vit C for immunity

## 2023-04-14 NOTE — ED Notes (Signed)
No answer in lobby-reportedly in bathroom

## 2023-04-14 NOTE — ED Triage Notes (Signed)
Complains of sore throat and runny nose.  Symptoms started Wednesday.  Denies fever.  Reports difficulty sleeping due to head congestion.    Took vitamin c

## 2023-04-22 NOTE — Patient Instructions (Signed)

## 2023-04-22 NOTE — Progress Notes (Signed)
  Virtual Visit via Telephone Note  I connected with James Branch , 04/23/23 9:46 AM by a telemedicine application and verified that I am speaking with the correct person using two identifiers.  Location: Patient: home Provider: home   I discussed the limitations of evaluation and management by telemedicine and the availability of in person appointments. The patient expressed understanding and agreed to proceed.   Shared Decision Making Visit Lung Cancer Screening Program [phone])   Eligibility: 68 y.o. Pack Years Smoking History Calculation = 39 pack years (# packs/per year x # years smoked) Recent History of coughing up blood  no Unexplained weight loss? no ( >Than 15 pounds within the last 6 months ) Prior History Lung / other cancer no (Diagnosis within the last 5 years already requiring surveillance chest CT Scans). Smoking Status Former Smoker Former Smokers: Years since quit: 5 years  Quit Date: 2020  Visit Components: Discussion included one or more decision making aids. YES Discussion included risk/benefits of screening. YES Discussion included potential follow up diagnostic testing for abnormal scans. YES Discussion included meaning and risk of over diagnosis. YES Discussion included meaning and risk of False Positives. YES Discussion included meaning of total radiation exposure. YES  Counseling Included: Importance of adherence to annual lung cancer LDCT screening. YES Impact of comorbidities on ability to participate in the program. YES Ability and willingness to under diagnostic treatment. YES  Smoking Cessation Counseling: Former Smokers:  Discussed the importance of maintaining cigarette abstinence. yes Diagnosis Code: Personal History of Nicotine Dependence. S12.108 Information about tobacco cessation classes and interventions provided to patient. Yes Patient provided with ticket for LDCT Scan. yes Written Order for Lung Cancer Screening with LDCT  placed in Epic. Yes (CT Chest Lung Cancer Screening Low Dose W/O CM) [id number]  Z12.2-Screening of respiratory organs Z87.891-Personal history of nicotine dependence   James Branch 04/23/23

## 2023-04-23 ENCOUNTER — Telehealth: Payer: Self-pay

## 2023-04-23 ENCOUNTER — Other Ambulatory Visit: Payer: Self-pay | Admitting: Family Medicine

## 2023-04-23 ENCOUNTER — Ambulatory Visit (INDEPENDENT_AMBULATORY_CARE_PROVIDER_SITE_OTHER): Payer: PPO | Admitting: Adult Health

## 2023-04-23 ENCOUNTER — Encounter: Payer: Self-pay | Admitting: Adult Health

## 2023-04-23 DIAGNOSIS — Z87891 Personal history of nicotine dependence: Secondary | ICD-10-CM

## 2023-04-23 DIAGNOSIS — R0609 Other forms of dyspnea: Secondary | ICD-10-CM

## 2023-04-23 DIAGNOSIS — J441 Chronic obstructive pulmonary disease with (acute) exacerbation: Secondary | ICD-10-CM

## 2023-04-23 DIAGNOSIS — G4733 Obstructive sleep apnea (adult) (pediatric): Secondary | ICD-10-CM

## 2023-04-23 DIAGNOSIS — R059 Cough, unspecified: Secondary | ICD-10-CM

## 2023-04-23 NOTE — Telephone Encounter (Signed)
 Spoke with pt's wife, Katheryn, who states pt went to Urgent Care a week ago and was given a steroid shot and a Z-pack. [name] states pt is no better and is running a low grade temp. [name] asks if there is anything else she can do without the patient having to come back in? Thanks  Patient called states he was recently seen at urgent care for upper respiratory infection. Was told to follow up with provider if he ran out of medication or was not feeling better. Patient states he is still feeling about the same and would like someone to give him a call to discuss options. Thank You.

## 2023-04-24 ENCOUNTER — Ambulatory Visit: Payer: PPO | Admitting: Family Medicine

## 2023-04-24 VITALS — BP 128/70 | HR 76 | Temp 98.3°F | Ht 70.0 in | Wt 344.4 lb

## 2023-04-24 DIAGNOSIS — J441 Chronic obstructive pulmonary disease with (acute) exacerbation: Secondary | ICD-10-CM | POA: Diagnosis not present

## 2023-04-24 MED ORDER — LEVOFLOXACIN 500 MG PO TABS: 500.0000 mg | ORAL_TABLET | Freq: Every day | ORAL | 0 refills | Status: AC

## 2023-04-24 MED ORDER — ALBUTEROL SULFATE HFA 108 (90 BASE) MCG/ACT IN AERS: 2.0000 | INHALATION_SPRAY | Freq: Four times a day (QID) | RESPIRATORY_TRACT | 0 refills | Status: DC | PRN

## 2023-04-24 MED ORDER — PREDNISONE 20 MG PO TABS
60.0000 mg | ORAL_TABLET | Freq: Every day | ORAL | 0 refills | Status: DC
Start: 2023-04-24 — End: 2023-08-17

## 2023-04-24 NOTE — Telephone Encounter (Signed)
 Requested medication (s) are due for refill today - yes  Requested medication (s) are on the active medication list -yes  Future visit scheduled -no  Last refill: 04/20/22 10.7g 11 RF  Notes to clinic: off protocol- provider review    Requested Prescriptions  Pending Prescriptions Disp Refills   BREZTRI  AEROSPHERE 160-9-4.8 MCG/ACT AERO [Pharmacy Med Name: BREZTRI  AERO 160-9-4.8*120 160-9-4.8 Aerosol] 10.7 g 10    Sig: INHALE TWO (2) PUFFS BY MOUTH AND INTO THE LUNGS TWICE DAILY     Off-Protocol Failed - 04/24/2023  8:54 AM      Failed - Medication not assigned to a protocol, review manually.      Failed - Valid encounter within last 12 months    Recent Outpatient Visits           1 year ago Dysuria   [facility] [name], Butler DASEN, MD   1 year ago Diarrhea, unspecified type   [facility] [name], Butler DASEN, MD   1 year ago Sore throat   [facility] Chandra Harlene LABOR, NP   2 years ago Encounter for [employer] annual wellness exam   [facility] Duanne, Butler DASEN, MD   2 years ago Morbid obesity [facility])   [facility] Duanne Butler DASEN, MD                 Requested Prescriptions  Pending Prescriptions Disp Refills   BREZTRI  AEROSPHERE 160-9-4.8 MCG/ACT AERO [Pharmacy Med Name: BREZTRI  AERO 160-9-4.8*120 160-9-4.8 Aerosol] 10.7 g 10    Sig: INHALE TWO (2) PUFFS BY MOUTH AND INTO THE LUNGS TWICE DAILY     Off-Protocol Failed - 04/24/2023  8:54 AM      Failed - Medication not assigned to a protocol, review manually.      Failed - Valid encounter within last 12 months    Recent Outpatient Visits           1 year ago Dysuria   [facility] [name], Butler DASEN, MD   1 year ago Diarrhea, unspecified type   [facility] Duanne Butler DASEN, MD   1 year ago Sore throat   [facility] Chandra Harlene LABOR, NP   2 years ago Encounter for [employer] annual  wellness exam   [facility] Duanne Butler DASEN, MD   2 years ago Morbid obesity [facility])   [facility] [name], Butler DASEN, MD

## 2023-04-24 NOTE — Progress Notes (Signed)
 Subjective:    Patient ID: James Branch, male    DOB: [birth year], 68 y.o.   MRN: [id number]  Has been sick ever since December 27.  He went to an urgent care and was diagnosed with a COPD exacerbation.  Per his report, flu test, COVID test, strep test were negative.  He was given an antibiotic and a steroid taper.  He has been taking his breztri .  Steroids ran out yesterday.  Antibiotics have been finished for several days.  Today on exam, he has diminished breath sounds throughout.  He has severe expiratory wheezing.  He has rhonchorous breath sounds throughout.  He denies any purulent sputum today.  Sputum is now clear.  He denies any fevers or chills.  He does report shortness of breath.  He is coughing constantly throughout our exam. Past Medical History:  Diagnosis Date   Arthritis    knees   Asthma    CAD (coronary artery disease)    COPD (chronic obstructive pulmonary disease) (HCC)    Diastolic dysfunction    GERD (gastroesophageal reflux disease)    Hyperlipidemia    Hypertension    Obesity    OSA on CPAP    ahi-84, 18 CWP   Sleep apnea    wears cpap    Smoker    Umbilical hernia    Past Surgical History:  Procedure Laterality Date   CARDIAC CATHETERIZATION     COLONOSCOPY  2007   INSERTION OF MESH N/A 12/16/2020   Procedure: INSERTION OF MESH;  Surgeon: Vernetta Berg, MD;  Location: [facility];  Service: General;  Laterality: N/A;   LEFT HEART CATH AND CORONARY ANGIOGRAPHY N/A 10/12/2020   Procedure: LEFT HEART CATH AND CORONARY ANGIOGRAPHY;  Surgeon: Elmira Newman PARAS, MD;  Location: [facility];  Service: Cardiovascular;  Laterality: N/A;   TONSILLECTOMY  1967   UMBILICAL HERNIA REPAIR N/A 12/16/2020   Procedure: UMBILICAL HERNIA REPAIR WITH MESH;  Surgeon: Vernetta Berg, MD;  Location: [facility] OR;  Service: General;  Laterality: N/A;  LMA     Allergies  Allergen Reactions   Penicillins Swelling   Social History   Socioeconomic History   Marital  status: Married    Spouse name: [name]   Number of children: 4   Years of education: Not on file   Highest education level: Some college, no degree  Occupational History   Occupation: Retired  Tobacco Use   Smoking status: Former    Current packs/day: 0.00    Average packs/day: 0.5 packs/day for 20.0 years (10.0 ttl pk-yrs)    Types: Cigarettes    Start date: 02/17/1999    Quit date: 02/17/2019    Years since quitting: 4.1   Smokeless tobacco: Never  Vaping Use   Vaping status: Never Used  Substance and Sexual Activity   Alcohol use: No    Alcohol/week: 0.0 standard drinks of alcohol   Drug use: No   Sexual activity: Yes    Birth control/protection: Condom  Other Topics Concern   Not on file  Social History Narrative   4 children, 18 grandchildren.   Social Drivers of Corporate Investment Banker Strain: Low Risk  (03/29/2023)   Overall Financial Resource Strain (CARDIA)    Difficulty of Paying Living Expenses: Not very hard  Food Insecurity: No Food Insecurity (03/29/2023)   Hunger Vital Sign    Worried About Running Out of Food in the Last Year: Never true    Ran Out of Food  in the Last Year: Never true  Transportation Needs: No Transportation Needs (03/29/2023)   PRAPARE - Administrator, Civil Service (Medical): No    Lack of Transportation (Non-Medical): No  Physical Activity: Sufficiently Active (03/29/2023)   Exercise Vital Sign    Days of Exercise per Week: 4 days    Minutes of Exercise per Session: 40 min  Stress: No Stress Concern Present (03/29/2023)   [employer] of Occupational Health - Occupational Stress Questionnaire    Feeling of Stress : Not at all  Social Connections: Moderately Integrated (03/29/2023)   Social Connection and Isolation Panel [NHANES]    Frequency of Communication with Friends and Family: Twice a week    Frequency of Social Gatherings with Friends and Family: Once a week    Attends Religious Services: Never     Database Administrator or Organizations: No    Attends Engineer, Structural: More than 4 times per year    Marital Status: Married  Catering Manager Violence: Not At Risk (04/26/2022)   Humiliation, Afraid, Rape, and Kick questionnaire    Fear of Current or Ex-Partner: No    Emotionally Abused: No    Physically Abused: No    Sexually Abused: No      Review of Systems  All other systems reviewed and are negative.      Objective:   Physical Exam Vitals reviewed.  Constitutional:      General: He is not in acute distress.    Appearance: Normal appearance. He is obese. He is not ill-appearing or toxic-appearing.  HENT:     Right Ear: Tympanic membrane and ear canal normal.     Left Ear: Tympanic membrane and ear canal normal.     Nose: No congestion or rhinorrhea.     Mouth/Throat:     Pharynx: No oropharyngeal exudate or posterior oropharyngeal erythema.  Cardiovascular:     Rate and Rhythm: Normal rate and regular rhythm.     Heart sounds: Normal heart sounds. No murmur heard. Pulmonary:     Effort: Pulmonary effort is normal. No accessory muscle usage or respiratory distress.     Breath sounds: Decreased air movement present. Decreased breath sounds, wheezing and rhonchi present. No rales.  Abdominal:     General: Bowel sounds are normal. There is no distension.     Palpations: Abdomen is soft.  Musculoskeletal:     Right lower leg: No edema.     Left lower leg: No edema.  Neurological:     Mental Status: He is alert.         SABRACOPD exacerbation (HCC) Patient is clearly having a COPD exacerbation.  He was given a DuoNeb today in the office due to the severity of his wheezing and his shortness of breath.  I will start the patient on prednisone  60 mg a day for 5 days.  I will extend antibiotic coverage by starting the patient on Levaquin  500 mg a day for 7 days.  Also recommended that he use albuterol  2 puffs every 4-6 hours as needed for wheezing.  He has not  been doing that up until now.  Reassess in 48 hours if no better or sooner if worse.

## 2023-04-26 ENCOUNTER — Ambulatory Visit (HOSPITAL_COMMUNITY)
Admission: RE | Admit: 2023-04-26 | Discharge: 2023-04-26 | Disposition: A | Discharge status: Home / Self Care | Payer: PPO | Source: Ambulatory Visit | Attending: Acute Care | Admitting: Acute Care

## 2023-04-26 DIAGNOSIS — J439 Emphysema, unspecified: Secondary | ICD-10-CM | POA: Insufficient documentation

## 2023-04-26 DIAGNOSIS — I2510 Atherosclerotic heart disease of native coronary artery without angina pectoris: Secondary | ICD-10-CM | POA: Insufficient documentation

## 2023-04-26 DIAGNOSIS — Z122 Encounter for screening for malignant neoplasm of respiratory organs: Secondary | ICD-10-CM | POA: Insufficient documentation

## 2023-04-26 DIAGNOSIS — I700 Atherosclerosis of aorta: Secondary | ICD-10-CM | POA: Diagnosis not present

## 2023-04-26 DIAGNOSIS — Z87891 Personal history of nicotine dependence: Secondary | ICD-10-CM | POA: Diagnosis not present

## 2023-04-26 MED ORDER — IPRATROPIUM-ALBUTEROL 0.5-2.5 (3) MG/3ML IN SOLN: 3.0000 mL | Freq: Once | RESPIRATORY_TRACT | Status: DC

## 2023-04-26 NOTE — Progress Notes (Signed)
 Duoneb charge added

## 2023-04-26 NOTE — Addendum Note (Signed)
 Addended by: Lynnea Ferrier T on: 04/26/2023 06:33 AM   Modules accepted: Orders

## 2023-04-27 ENCOUNTER — Other Ambulatory Visit: Payer: Self-pay | Admitting: Family Medicine

## 2023-04-27 ENCOUNTER — Telehealth: Payer: Self-pay | Admitting: Family Medicine

## 2023-04-27 ENCOUNTER — Encounter: Payer: Self-pay | Admitting: Family Medicine

## 2023-04-27 MED ORDER — IPRATROPIUM-ALBUTEROL 0.5-2.5 (3) MG/3ML IN SOLN
3.0000 mL | Freq: Once | RESPIRATORY_TRACT | Status: AC
  Administered 2023-04-24: 3 mL via RESPIRATORY_TRACT

## 2023-04-27 MED ORDER — IPRATROPIUM-ALBUTEROL 0.5-2.5 (3) MG/3ML IN SOLN
3.0000 mL | Freq: Once | RESPIRATORY_TRACT | Status: AC
  Administered 2023-04-27: 3 mL via RESPIRATORY_TRACT

## 2023-04-27 MED ORDER — BREZTRI AEROSPHERE 160-9-4.8 MCG/ACT IN AERO: 2.0000 | INHALATION_SPRAY | Freq: Two times a day (BID) | RESPIRATORY_TRACT | 11 refills | Status: DC

## 2023-04-27 NOTE — Addendum Note (Signed)
 Addended by: Venia Carbon K on: 04/27/2023 12:34 PM   Modules accepted: Orders

## 2023-04-27 NOTE — Telephone Encounter (Signed)
 Pls msg from CRM: med requested is not on current med list? Pls advise?

## 2023-04-27 NOTE — Telephone Encounter (Signed)
 Copied from CRM [id number]. Topic: Clinical - Medication Refill >> Apr 26, 2023  3:02 PM Laurier BROCKS wrote: Most Recent Primary Care Visit:  Provider: DUANNE LOWERS T  Department: [facility]  Visit Type: ACUTE  Date: 04/24/2023  Medication: Budeson-Glycopyrrol-Formoterol  (BREZTRI  AEROSPHERE) 160-9-4.8 MCG/ACT AERO  Has the patient contacted their pharmacy? Yes (Agent: If no, request that the patient contact the pharmacy for the refill. If patient does not wish to contact the pharmacy document the reason why and proceed with request.) (Agent: If yes, when and what did the pharmacy advise?)  Is this the correct pharmacy for this prescription? Yes If no, delete pharmacy and type the correct one.  This is the patient's preferred pharmacy:    [facility], [place] - [address] [place] [address] Phone: [phone] Fax: [phone]  [facility] [place]  - [place], [place] - [address] [place] [address] Phone: [phone] Fax: [phone]  Has the prescription been filled recently? Yes  Is the patient out of the medication? Yes  Has the patient been seen for an appointment in the last year OR does the patient have an upcoming appointment? Yes  Can we respond through MyChart? Yes  Agent: Please be advised that Rx refills may take up to 3 business days. We ask that you follow-up with your pharmacy.

## 2023-05-05 ENCOUNTER — Other Ambulatory Visit: Payer: Self-pay | Admitting: Family Medicine

## 2023-05-07 ENCOUNTER — Other Ambulatory Visit: Payer: Self-pay

## 2023-05-07 DIAGNOSIS — Z122 Encounter for screening for malignant neoplasm of respiratory organs: Secondary | ICD-10-CM

## 2023-05-07 DIAGNOSIS — Z87891 Personal history of nicotine dependence: Secondary | ICD-10-CM

## 2023-05-08 ENCOUNTER — Other Ambulatory Visit: Payer: Self-pay

## 2023-05-08 DIAGNOSIS — J441 Chronic obstructive pulmonary disease with (acute) exacerbation: Secondary | ICD-10-CM

## 2023-05-08 MED ORDER — BREZTRI AEROSPHERE 160-9-4.8 MCG/ACT IN AERO
2.0000 | INHALATION_SPRAY | Freq: Two times a day (BID) | RESPIRATORY_TRACT | 10 refills | Status: DC
  Filled 2023-08-20: qty 10.7, 30d supply, fill #0

## 2023-05-30 NOTE — ED Provider Notes (Signed)
[facility]    CSN: [id number] Arrival date & time: 04/14/23  1141      History   Chief Complaint Chief Complaint  Patient presents with   Appointment    12:00   Cough    HPI James Branch is a 68 y.o. male.   Complains of sore throat and runny nose.  Symptoms started Wednesday.   Denies fever.  Reports difficulty sleeping due to head congestion.    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   Leg edema 06/23/2022   Essential hypertension 06/22/2021   Abnormal stress test 07/05/2020   Upper respiratory tract infection 04/02/2020   Exertional dyspnea 09/05/2019   Mixed hyperlipidemia 02/20/2019   Coronary artery disease    Bilateral sensorineural hearing loss 01/15/2017   Morbid obesity (HCC)    Smoker    OSA on CPAP    Diastolic dysfunction     Past Surgical History:  Procedure Laterality Date   CARDIAC CATHETERIZATION     COLONOSCOPY  2007   INSERTION OF MESH N/A 12/16/2020   Procedure: INSERTION OF MESH;  Surgeon: Abigail Miyamoto, MD;  Location: [facility] OR;  Service: General;  Laterality: N/A;   LEFT HEART CATH AND CORONARY ANGIOGRAPHY N/A 10/12/2020   Procedure: LEFT HEART CATH AND CORONARY ANGIOGRAPHY;  Surgeon: [name], MD;  [facility];  Service: Cardiovascular;  Laterality: N/A;   TONSILLECTOMY  1967   UMBILICAL HERNIA REPAIR N/A 12/16/2020   Procedure: UMBILICAL HERNIA REPAIR WITH  MESH;  Surgeon: Abigail Miyamoto, MD;  Location: [facility];  Service: General;  Laterality: N/A;  LMA       Home Medications    Prior to Admission medications   Medication Sig Start Date End Date Taking? Authorizing Provider  azithromycin (ZITHROMAX) 250 MG tablet Take 1 tablet (250 mg total) by mouth daily. Take first 2 tablets together, then 1 every day until finished. 04/14/23  Yes Artez Regis, Linde Gillis, NP  ipratropium (ATROVENT) 0.03 % nasal spray Place 1 spray into both nostrils every 12 (twelve) hours. 04/14/23  Yes Bobette Leyh, Linde Gillis, NP  albuterol (VENTOLIN HFA) 108 (90 Base) MCG/ACT inhaler Inhale 2 puffs into the lungs every 6 (six) hours as needed for wheezing or shortness of breath. 04/24/23   Donita Brooks, MD  atorvastatin (LIPITOR) 20 MG tablet TAKE 1 TABLET BY MOUTH ONCE DAILY 01/09/23   Donita Brooks, MD  Budeson-Glycopyrrol-Formoterol (BREZTRI AEROSPHERE) 160-9-4.8 MCG/ACT AERO Inhale 2 puffs into the lungs in the morning and at bedtime. 05/08/23   Donita Brooks, MD  esomeprazole (NEXIUM) 40 MG capsule Take 1 capsule (40 mg total) by mouth daily. Dispense generic 02/01/23   Donita Brooks, MD  lisinopril (ZESTRIL) 20 MG tablet TAKE 1 TABLET BY MOUTH  ONCE DAILY 04/04/23   Donita Brooks, MD  meclizine (ANTIVERT) 25 MG tablet Take 1 tablet (25 mg total) by mouth 3 (three) times daily as needed. 04/02/23   Donita Brooks, MD  nitroGLYCERIN (NITROSTAT) 0.4 MG SL tablet Place 1 tablet (0.4 mg total) under the tongue every 5 (five) minutes as needed for chest pain. 05/04/22   Donita Brooks, MD  predniSONE (DELTASONE) 20 MG tablet Take 3 tablets (60 mg total) by mouth daily with breakfast. 04/24/23   Donita Brooks, MD  sildenafil (VIAGRA) 50 MG tablet Take 50 mg by mouth daily as needed. 02/21/22   [provider]    Family History Family History  Problem Relation Age of Onset   Hypertension Mother    Hypertension Father    Throat cancer Father    Colon  cancer Neg Hx    Colon polyps Neg Hx    Esophageal cancer Neg Hx    Rectal cancer Neg Hx    Stomach cancer Neg Hx     Social History Social History   Tobacco Use   Smoking status: Former    Current packs/day: 0.00    Average packs/day: 0.5 packs/day for 20.0 years (10.0 ttl pk-yrs)    Types: Cigarettes    Start date: 02/17/1999    Quit date: 02/17/2019    Years since quitting: 4.2   Smokeless tobacco: Never  Vaping Use   Vaping status: Never Used  Substance Use Topics   Alcohol use: No    Alcohol/week: 0.0 standard drinks of alcohol   Drug use: No     Allergies   Penicillins   Review of Systems Review of Systems  HENT:  Positive for congestion and sore throat.   Respiratory:  Positive for cough.      Physical Exam Triage Vital Signs ED Triage Vitals  Encounter Vitals Group     BP 04/14/23 1214 115/66     Systolic BP Percentile --      Diastolic BP Percentile --      Pulse Rate 04/14/23 1214 80     Resp 04/14/23 1214 20     Temp 04/14/23 1214 98.2 F (36.8 C)     Temp Source 04/14/23 1214 Oral     SpO2 04/14/23 1214 95 %     Weight --      Height --      Head Circumference --      Peak Flow --      Pain Score 04/14/23 1211 4     Pain Loc --      Pain Education --      Exclude from Growth Chart --    No data found.  Updated Vital Signs BP 115/66 (BP Location: Left Arm)   Pulse 80   Temp 98.2 F (36.8 C) (Oral)   Resp 20   SpO2 95%   Visual Acuity Right Eye Distance:   Left Eye Distance:   Bilateral Distance:    Right Eye Near:   Left Eye Near:    Bilateral Near:     Physical Exam Constitutional:      Appearance: Normal appearance.  HENT:     Nose: Congestion present.     Mouth/Throat:     Mouth: Mucous membranes are moist.  Cardiovascular:     Rate and Rhythm: Normal rate and regular rhythm.  Pulmonary:     Breath sounds: Wheezing present.     Comments: Moist cough Neurological:  Mental Status: He is alert.      UC  Treatments / Results  Labs (all labs ordered are listed, but only abnormal results are displayed) Labs Reviewed  POC COVID19/FLU A&B COMBO    EKG   Radiology No results found.  Procedures Procedures (including critical care time)  Medications Ordered in UC Medications - No data to display  Initial Impression / Assessment and Plan / UC Course  I have reviewed the triage vital signs and the nursing notes.  Pertinent labs & imaging results that were available during my care of the patient were reviewed by me and considered in my medical decision making (see chart for details).  Clinical Course as of 05/30/23 0901  Wed May 30, 2023  0901 POC Covid + Flu A/B Antigen [DB]    Clinical Course User Index [DB] Nelda Marseille, NP    Patient has history of COPD with a recent URI.  Patient is having increased cough and increased mucus production.  He is reporting increased shortness of breath especially with exertion.  Auscultation abnormal with wheezing generalized.   He is advised to follow up with PCP or pulmonology for COPD exacerbation management.   Final Clinical Impressions(s) / UC Diagnoses   Final diagnoses:  Shortness of breath  Subacute cough  COPD exacerbation (HCC)  Viral upper respiratory tract infection     Discharge Instructions      You have COPD exacerbation associated to a virus illness.  We have ordered a chest xray to ruled out pneumonia.  Take medications as ordered. Salt water gargles Tylenol 1000mg  three times a day for generalized body aches, sore throat and fever Ensure hydration Consider adding on Zinc and Vit C for immunity     ED Prescriptions     Medication Sig Dispense Auth. Provider   azithromycin (ZITHROMAX) 250 MG tablet Take 1 tablet (250 mg total) by mouth daily. Take first 2 tablets together, then 1 every day until finished. 6 tablet Dilraj Killgore, Linde Gillis, NP   predniSONE (DELTASONE) 10 MG tablet Take 4 tablets (40 mg total) by mouth  daily with breakfast for 3 days, THEN 3 tablets (30 mg total) daily with breakfast for 3 days, THEN 2 tablets (20 mg total) daily with breakfast for 3 days, THEN 1 tablet (10 mg total) daily with breakfast for 3 days. Patient not taking:  Reported on 04/24/2023 30 tablet Gaylan Fauver M, NP   ipratropium (ATROVENT) 0.03 % nasal spray Place 1 spray into both nostrils every 12 (twelve) hours. 30 mL Lion Fernandez, Linde Gillis, NP      PDMP not reviewed this encounter.   Nelda Marseille, NP 05/30/23 [phone]

## 2023-07-03 ENCOUNTER — Other Ambulatory Visit: Payer: Self-pay | Admitting: Family Medicine

## 2023-07-03 ENCOUNTER — Encounter: Payer: Self-pay | Admitting: Family Medicine

## 2023-07-03 DIAGNOSIS — E782 Mixed hyperlipidemia: Secondary | ICD-10-CM

## 2023-07-05 NOTE — Telephone Encounter (Signed)
 Requested Prescriptions  Pending Prescriptions Disp Refills   atorvastatin (LIPITOR) 20 MG tablet [Pharmacy Med Name: ATORVASTATIN 20MG  TAB 20 Tablet] 30 tablet 8    Sig: TAKE 1 TABLET BY MOUTH ONCE DAILY     Cardiovascular:  Antilipid - Statins Failed - 07/05/2023  9:06 AM      Failed - Valid encounter within last 12 months    Recent Outpatient Visits           1 year ago Dysuria   [facility] Donita Brooks, MD   2 years ago Diarrhea, unspecified type   [facility] Donita Brooks, MD   2 years ago Sore throat   [facility] Valentino Nose, NP   2 years ago Encounter for [employer] annual wellness exam   [facility] Tanya Nones, Priscille Heidelberg, MD   2 years ago Morbid obesity [facility])   [facility] [name], Priscille Heidelberg, MD       Future Appointments             In 1 month Patwardhan, Anabel Bene, MD [facility]            Failed - Lipid Panel in normal range within the last 12 months    Cholesterol  Date Value Ref Range Status  03/28/2023 128 <200 mg/dL Final   LDL Cholesterol (Calc)  Date Value Ref Range Status  03/28/2023 57 mg/dL (calc) Final    Comment:    Reference range: <100 . Desirable range <100 mg/dL for primary prevention;   <70 mg/dL for patients with CHD or diabetic patients  with > or = 2 CHD risk factors. Marland Kitchen LDL-C is now calculated using the Martin-Hopkins  calculation, which is a validated novel method providing  better accuracy than the Friedewald equation in the  estimation of LDL-C.  Horald Pollen et al. Lenox Ahr. 2536;644(03): 2061-2068  (http://education.[facility].com/faq/FAQ164)    HDL  Date Value Ref Range Status  03/28/2023 53 > OR = 40 mg/dL Final   Triglycerides  Date Value Ref Range Status  03/28/2023 93 <150 mg/dL Final         Passed - Patient is not pregnant

## 2023-07-18 DIAGNOSIS — M542 Cervicalgia: Secondary | ICD-10-CM | POA: Insufficient documentation

## 2023-07-18 DIAGNOSIS — M25511 Pain in right shoulder: Secondary | ICD-10-CM | POA: Diagnosis not present

## 2023-07-30 ENCOUNTER — Ambulatory Visit: Payer: Self-pay | Admitting: Cardiology

## 2023-08-01 DIAGNOSIS — M25511 Pain in right shoulder: Secondary | ICD-10-CM | POA: Diagnosis not present

## 2023-08-01 DIAGNOSIS — M542 Cervicalgia: Secondary | ICD-10-CM | POA: Diagnosis not present

## 2023-08-17 ENCOUNTER — Ambulatory Visit: Payer: PPO | Attending: Cardiology | Admitting: Cardiology

## 2023-08-17 ENCOUNTER — Other Ambulatory Visit (HOSPITAL_COMMUNITY): Payer: Self-pay

## 2023-08-17 ENCOUNTER — Encounter: Payer: Self-pay | Admitting: Cardiology

## 2023-08-17 VITALS — BP 112/58 | HR 71 | Ht 70.0 in | Wt 346.8 lb

## 2023-08-17 DIAGNOSIS — I872 Venous insufficiency (chronic) (peripheral): Secondary | ICD-10-CM

## 2023-08-17 DIAGNOSIS — I10 Essential (primary) hypertension: Secondary | ICD-10-CM | POA: Diagnosis not present

## 2023-08-17 DIAGNOSIS — I2510 Atherosclerotic heart disease of native coronary artery without angina pectoris: Secondary | ICD-10-CM | POA: Diagnosis not present

## 2023-08-17 DIAGNOSIS — E782 Mixed hyperlipidemia: Secondary | ICD-10-CM

## 2023-08-17 DIAGNOSIS — I25118 Atherosclerotic heart disease of native coronary artery with other forms of angina pectoris: Secondary | ICD-10-CM

## 2023-08-17 DIAGNOSIS — R600 Localized edema: Secondary | ICD-10-CM | POA: Diagnosis not present

## 2023-08-17 MED ORDER — FUROSEMIDE 20 MG PO TABS: 20.0000 mg | ORAL_TABLET | ORAL | 1 refills | Status: DC | PRN

## 2023-08-17 MED ORDER — IPRATROPIUM BROMIDE 0.03 % NA SOLN: 1.0000 | Freq: Two times a day (BID) | NASAL | 12 refills | Status: DC

## 2023-08-17 NOTE — Progress Notes (Unsigned)
  Cardiology Office Note:  .   Date:  08/17/2023  ID:  James Branch, DOB 02-07-1956, MRN [id number] PCP: Austine Lefort, MD  [facility] Providers Cardiologist:  Fransico Ivy, MD PCP: Austine Lefort, MD  No chief complaint on file.    James Branch is a 68 y.o. male with hypertension, hyperlipidemia, coronary calcification, morbid obesity, venous insufficiency  Discussed the use of AI scribe software for clinical note transcription with the patient, who gave verbal consent to proceed.  History of Present Illness       There were no vitals filed for this visit.    ROS      Studies Reviewed: Aaron Aas        EKG 08/17/2023: Normal sinus rhythm Normal ECG When compared with ECG of 12-Oct-2020 13:54, No significant change was found    Independently interpreted 03/2023: Chol 128, TG 93, HDL 53, LDL 57 Hb 14.5 Cr 4.09  11/2022: HbA1C 6.0%  Coronary angiogram 2022: Left dominant circulation Mild calcific disease 20% in prox-mid LAD Dyspnea most likely due to obesity and elevated LVEDP Okay to stop Aspirin  Okay to proceed with umbilical hernia surgery    Risk Assessment/Calculations:   {Does this patient have ATRIAL FIBRILLATION?[phone]}    Physical Exam   VISIT DIAGNOSES:   ICD-10-CM   1. Coronary artery disease involving native coronary artery of native heart with other form of angina pectoris (HCC)  I25.118 EKG 12-Lead    2. Essential hypertension  I10 EKG 12-Lead    3. Mixed hyperlipidemia  E78.2 EKG 12-Lead       James Branch is a 68 y.o. male with hypertension, hyperlipidemia, coronary calcification, morbid obesity, venous insufficiency  Assessment & Plan  CAD: Mostly medial calcification on coronary angiogram with no significant obstructive coronary artery disease seen. Okay to omit Aspirin .  Continue statin Agree with stopping metoprolol  given no clear indication, and recent episode of lightheadedness while on  metoprolol .   Leg edema: Resolved. No HFpEF based on echocardiogram and normal proBNP. Does not need torsemide , especially given recent dehydration and lightheadedness episode.   Hypertension: Controlled     {Are you ordering a CV Procedure (e.g. stress test, cath, DCCV, TEE, etc)?   Press F2        :[id number]}    No orders of the defined types were placed in this encounter.    F/u in ***  Signed, Cody Das, MD

## 2023-08-17 NOTE — Patient Instructions (Signed)
 Medication Instructions:  START AS NEEDED Lasix  20 mg daily   *If you need a refill on your cardiac medications before your next appointment, please call your pharmacy*  Testing/Procedures: LE VENOUS REFLUX   Your physician has requested that you have a lower or upper extremity venous duplex. This test is an ultrasound of the veins in the legs or arms. It looks at venous blood flow that carries blood from the heart to the legs or arms. Allow one hour for a Lower Venous exam. Allow thirty minutes for an Upper Venous exam. There are no restrictions or special instructions.  Please note: We ask at that you not bring children with you during ultrasound (echo/ vascular) testing. Due to room size and safety concerns, children are not allowed in the ultrasound rooms during exams. Our front office staff cannot provide observation of children in our lobby area while testing is being conducted. An adult accompanying a patient to their appointment will only be allowed in the ultrasound room at the discretion of the ultrasound technician under special circumstances. We apologize for any inconvenience.   Follow-Up: At [facility], you and your health needs are our priority.  As part of our continuing mission to provide you with exceptional heart care, our providers are all part of one team.  This team includes your primary Cardiologist (physician) and Advanced Practice Providers or APPs (Physician Assistants and Nurse Practitioners) who all work together to provide you with the care you need, when you need it.  Your next appointment:   AS NEEDED  Provider:   Cody Das, MD    We recommend signing up for the patient portal called "MyChart".  Sign up information is provided on this After Visit Summary.  MyChart is used to connect with patients for Virtual Visits (Telemedicine).  Patients are able to view lab/test results, encounter notes, upcoming appointments, etc.  Non-urgent messages can  be sent to your provider as well.   To learn more about what you can do with MyChart, go to [email or web address].

## 2023-08-18 ENCOUNTER — Other Ambulatory Visit (HOSPITAL_COMMUNITY): Payer: Self-pay

## 2023-08-18 ENCOUNTER — Encounter: Payer: Self-pay | Admitting: Cardiology

## 2023-08-20 ENCOUNTER — Other Ambulatory Visit: Payer: Self-pay

## 2023-08-20 ENCOUNTER — Other Ambulatory Visit (HOSPITAL_COMMUNITY): Payer: Self-pay

## 2023-08-20 MED ORDER — BUDESON-GLYCOPYRROL-FORMOTEROL 160-9-4.8 MCG/ACT IN AERO
2.0000 | INHALATION_SPRAY | Freq: Two times a day (BID) | RESPIRATORY_TRACT | 11 refills | Status: AC
Start: 2023-04-27 — End: ?
  Filled 2023-08-20: qty 10.7, 30d supply, fill #0
  Filled 2023-09-15: qty 10.7, 30d supply, fill #1
  Filled 2023-10-15: qty 10.7, 30d supply, fill #2
  Filled 2023-11-13: qty 10.7, 30d supply, fill #3
  Filled 2023-12-11: qty 10.7, 30d supply, fill #4
  Filled 2024-01-07: qty 10.7, 30d supply, fill #5
  Filled 2024-02-26: qty 10.7, 30d supply, fill #6

## 2023-08-20 MED ORDER — SILDENAFIL CITRATE 50 MG PO TABS
50.0000 mg | ORAL_TABLET | Freq: Every day | ORAL | 0 refills | Status: DC | PRN
  Filled 2023-08-20 – 2023-08-24 (×2): qty 4, 4d supply, fill #0
  Filled 2023-10-15: qty 4, 30d supply, fill #0

## 2023-08-20 MED FILL — Atorvastatin Calcium Tab 20 MG (Base Equivalent): ORAL | 30 days supply | Qty: 30 | Fill #0 | Status: CN

## 2023-08-20 MED FILL — Lisinopril Tab 20 MG: ORAL | 30 days supply | Qty: 30 | Fill #0 | Status: CN

## 2023-08-21 ENCOUNTER — Other Ambulatory Visit: Payer: Self-pay

## 2023-08-21 ENCOUNTER — Other Ambulatory Visit (HOSPITAL_COMMUNITY): Payer: Self-pay

## 2023-08-21 MED ORDER — ACETAMINOPHEN 500 MG PO TABS
1000.0000 mg | ORAL_TABLET | Freq: Three times a day (TID) | ORAL | 0 refills | Status: DC
  Filled 2023-08-21: qty 42, 7d supply, fill #0

## 2023-08-21 MED ORDER — BUDESON-GLYCOPYRROL-FORMOTEROL 160-9-4.8 MCG/ACT IN AERO
2.0000 | INHALATION_SPRAY | Freq: Two times a day (BID) | RESPIRATORY_TRACT | 10 refills | Status: DC
  Filled 2023-08-21: qty 10.7, 30d supply, fill #0

## 2023-08-22 ENCOUNTER — Encounter (HOSPITAL_COMMUNITY): Payer: Self-pay

## 2023-08-22 ENCOUNTER — Other Ambulatory Visit (HOSPITAL_COMMUNITY): Payer: Self-pay

## 2023-08-24 ENCOUNTER — Other Ambulatory Visit: Payer: Self-pay

## 2023-08-24 ENCOUNTER — Other Ambulatory Visit (HOSPITAL_COMMUNITY): Payer: Self-pay

## 2023-08-24 MED FILL — Lisinopril Tab 20 MG: ORAL | 30 days supply | Qty: 30 | Fill #0 | Status: AC

## 2023-08-24 MED FILL — Atorvastatin Calcium Tab 20 MG (Base Equivalent): ORAL | 30 days supply | Qty: 30 | Fill #0 | Status: AC

## 2023-08-30 DIAGNOSIS — N4883 Acquired buried penis: Secondary | ICD-10-CM | POA: Diagnosis not present

## 2023-08-30 DIAGNOSIS — N35911 Unspecified urethral stricture, male, meatal: Secondary | ICD-10-CM | POA: Diagnosis not present

## 2023-08-31 ENCOUNTER — Other Ambulatory Visit: Payer: Self-pay

## 2023-09-07 ENCOUNTER — Ambulatory Visit: Payer: Self-pay | Admitting: Cardiology

## 2023-09-07 ENCOUNTER — Ambulatory Visit (HOSPITAL_COMMUNITY)
Admission: RE | Admit: 2023-09-07 | Discharge: 2023-09-07 | Disposition: A | Discharge status: Home / Self Care | Source: Ambulatory Visit | Attending: Cardiology | Admitting: Cardiology

## 2023-09-07 DIAGNOSIS — R600 Localized edema: Secondary | ICD-10-CM | POA: Insufficient documentation

## 2023-09-07 DIAGNOSIS — I872 Venous insufficiency (chronic) (peripheral): Secondary | ICD-10-CM

## 2023-09-07 NOTE — Progress Notes (Signed)
 Venous insufficiency noted in both legs.  Continue compression stockings and leg elevation at night.  If symptoms do not improve after 3 months of compression stockings TSH, would consider vascular surgery referral.  This could be placed by either me or patient's PCP.  Thanks MJP

## 2023-09-11 NOTE — Telephone Encounter (Signed)
 Patient is returning phone call.

## 2023-09-15 ENCOUNTER — Other Ambulatory Visit (HOSPITAL_COMMUNITY): Payer: Self-pay

## 2023-09-15 MED FILL — Lisinopril Tab 20 MG: ORAL | 30 days supply | Qty: 30 | Fill #1 | Status: AC

## 2023-09-15 MED FILL — Atorvastatin Calcium Tab 20 MG (Base Equivalent): ORAL | 30 days supply | Qty: 30 | Fill #1 | Status: AC

## 2023-10-15 ENCOUNTER — Other Ambulatory Visit (HOSPITAL_COMMUNITY): Payer: Self-pay

## 2023-10-15 ENCOUNTER — Other Ambulatory Visit: Payer: Self-pay

## 2023-10-15 DIAGNOSIS — M79672 Pain in left foot: Secondary | ICD-10-CM | POA: Diagnosis not present

## 2023-10-15 MED FILL — Lisinopril Tab 20 MG: ORAL | 30 days supply | Qty: 30 | Fill #2 | Status: AC

## 2023-10-15 MED FILL — Atorvastatin Calcium Tab 20 MG (Base Equivalent): ORAL | 30 days supply | Qty: 30 | Fill #2 | Status: AC

## 2023-11-13 ENCOUNTER — Other Ambulatory Visit (HOSPITAL_COMMUNITY): Payer: Self-pay

## 2023-11-13 ENCOUNTER — Other Ambulatory Visit: Payer: Self-pay

## 2023-11-17 MED FILL — Atorvastatin Calcium Tab 20 MG (Base Equivalent): ORAL | 90 days supply | Qty: 90 | Fill #3 | Status: AC

## 2023-11-19 ENCOUNTER — Other Ambulatory Visit (HOSPITAL_BASED_OUTPATIENT_CLINIC_OR_DEPARTMENT_OTHER): Payer: Self-pay

## 2023-11-19 ENCOUNTER — Other Ambulatory Visit (HOSPITAL_COMMUNITY): Payer: Self-pay

## 2023-11-19 MED FILL — Lisinopril Tab 20 MG: ORAL | 30 days supply | Qty: 30 | Fill #3 | Status: AC

## 2023-11-20 ENCOUNTER — Other Ambulatory Visit (HOSPITAL_COMMUNITY): Payer: Self-pay

## 2023-11-27 ENCOUNTER — Other Ambulatory Visit

## 2023-11-27 DIAGNOSIS — E782 Mixed hyperlipidemia: Secondary | ICD-10-CM

## 2023-11-27 DIAGNOSIS — Z131 Encounter for screening for diabetes mellitus: Secondary | ICD-10-CM | POA: Diagnosis not present

## 2023-11-28 LAB — COMPLETE METABOLIC PANEL WITHOUT GFR
AG Ratio: 1.7 (calc) (ref 1.0–2.5)
ALT: 17 U/L (ref 9–46)
AST: 14 U/L (ref 10–35)
Albumin: 4.1 g/dL (ref 3.6–5.1)
Alkaline phosphatase (APISO): 73 U/L (ref 35–144)
BUN: 14 mg/dL (ref 7–25)
CO2: 26 mmol/L (ref 20–32)
Calcium: 9.4 mg/dL (ref 8.6–10.3)
Chloride: 107 mmol/L (ref 98–110)
Creat: 0.99 mg/dL (ref 0.70–1.28)
Globulin: 2.4 g/dL (ref 1.9–3.7)
Glucose, Bld: 105 mg/dL — ABNORMAL HIGH (ref 65–99)
Potassium: 4.6 mmol/L (ref 3.5–5.3)
Sodium: 141 mmol/L (ref 135–146)
Total Bilirubin: 0.6 mg/dL (ref 0.2–1.2)
Total Protein: 6.5 g/dL (ref 6.1–8.1)

## 2023-11-28 LAB — CBC WITH DIFFERENTIAL/PLATELET
Absolute Lymphocytes: 2340 {cells}/uL (ref 850–3900)
Absolute Monocytes: 539 {cells}/uL (ref 200–950)
Basophils Absolute: 52 {cells}/uL (ref 0–200)
Basophils Relative: 0.6 %
Eosinophils Absolute: 452 {cells}/uL (ref 15–500)
Eosinophils Relative: 5.2 %
HCT: 44 % (ref 38.5–50.0)
Hemoglobin: 14.4 g/dL (ref 13.2–17.1)
MCH: 30.9 pg (ref 27.0–33.0)
MCHC: 32.7 g/dL (ref 32.0–36.0)
MCV: 94.4 fL (ref 80.0–100.0)
MPV: 11.9 fL (ref 7.5–12.5)
Monocytes Relative: 6.2 %
Neutro Abs: 5316 {cells}/uL (ref 1500–7800)
Neutrophils Relative %: 61.1 %
Platelets: 232 Thousand/uL (ref 140–400)
RBC: 4.66 Million/uL (ref 4.20–5.80)
RDW: 13.1 % (ref 11.0–15.0)
Total Lymphocyte: 26.9 %
WBC: 8.7 Thousand/uL (ref 3.8–10.8)

## 2023-11-28 LAB — HEMOGLOBIN A1C
Hgb A1c MFr Bld: 6.2 % — ABNORMAL HIGH (ref <5.7)
Mean Plasma Glucose: 131 mg/dL
eAG (mmol/L): 7.3 mmol/L

## 2023-11-28 LAB — LIPID PANEL
Cholesterol: 140 mg/dL (ref <200)
HDL: 50 mg/dL (ref >=40)
LDL Cholesterol (Calc): 74 mg/dL
Non-HDL Cholesterol (Calc): 90 mg/dL (ref <130)
Total CHOL/HDL Ratio: 2.8 (calc) (ref <5.0)
Triglycerides: 76 mg/dL (ref <150)

## 2023-11-29 ENCOUNTER — Ambulatory Visit: Payer: Self-pay | Admitting: Family Medicine

## 2023-11-30 ENCOUNTER — Other Ambulatory Visit (HOSPITAL_COMMUNITY): Payer: Self-pay

## 2023-11-30 ENCOUNTER — Other Ambulatory Visit: Payer: Self-pay | Admitting: Family Medicine

## 2023-11-30 MED ORDER — ALBUTEROL SULFATE HFA 108 (90 BASE) MCG/ACT IN AERS
2.0000 | INHALATION_SPRAY | Freq: Four times a day (QID) | RESPIRATORY_TRACT | 3 refills | Status: DC | PRN
  Filled 2023-11-30: qty 13.4, fill #0

## 2023-11-30 MED ORDER — ALBUTEROL SULFATE HFA 108 (90 BASE) MCG/ACT IN AERS: 2.0000 | INHALATION_SPRAY | Freq: Four times a day (QID) | RESPIRATORY_TRACT | 3 refills | Status: DC | PRN

## 2023-12-05 ENCOUNTER — Other Ambulatory Visit (HOSPITAL_COMMUNITY): Payer: Self-pay

## 2023-12-06 ENCOUNTER — Ambulatory Visit: Admitting: Family Medicine

## 2023-12-06 ENCOUNTER — Other Ambulatory Visit (HOSPITAL_COMMUNITY): Payer: Self-pay

## 2023-12-06 ENCOUNTER — Other Ambulatory Visit: Payer: Self-pay

## 2023-12-06 ENCOUNTER — Encounter: Payer: Self-pay | Admitting: Family Medicine

## 2023-12-06 VITALS — BP 128/78 | HR 71 | Temp 97.9°F | Ht 70.0 in | Wt 341.6 lb

## 2023-12-06 DIAGNOSIS — R7303 Prediabetes: Secondary | ICD-10-CM

## 2023-12-06 DIAGNOSIS — J449 Chronic obstructive pulmonary disease, unspecified: Secondary | ICD-10-CM

## 2023-12-06 DIAGNOSIS — G4733 Obstructive sleep apnea (adult) (pediatric): Secondary | ICD-10-CM

## 2023-12-06 DIAGNOSIS — R600 Localized edema: Secondary | ICD-10-CM

## 2023-12-06 MED ORDER — ZEPBOUND 2.5 MG/0.5ML ~~LOC~~ SOAJ
2.5000 mg | SUBCUTANEOUS | 3 refills | Status: DC
  Filled 2023-12-06 (×3): qty 2, 28d supply, fill #0

## 2023-12-06 MED ORDER — FUROSEMIDE 20 MG PO TABS
20.0000 mg | ORAL_TABLET | ORAL | 1 refills | Status: DC | PRN
  Filled 2023-12-06: qty 90, 90d supply, fill #0

## 2023-12-06 MED ORDER — ALBUTEROL SULFATE HFA 108 (90 BASE) MCG/ACT IN AERS
2.0000 | INHALATION_SPRAY | Freq: Four times a day (QID) | RESPIRATORY_TRACT | 3 refills | Status: DC | PRN
  Filled 2023-12-06: qty 13.4, 50d supply, fill #0
  Filled 2023-12-10: qty 6.7, 25d supply, fill #0

## 2023-12-06 NOTE — Progress Notes (Signed)
 Subjective:    Patient ID: James Branch, male    DOB: 1955-07-30, 68 y.o.   MRN: [id number] Patient has a longstanding history of obstructive sleep apnea.  He is currently on CPAP with auto titration.  The patient had a sleep study set ago.  He believes it was done in 2018 with another doctor.  I do not have a record of this.  However he has been wearing his CPAP every night for at least 8 hours.  He benefits from this dramatically.  Without the CPAP, he is unable to sleep.  He feels extremely tired in the morning.  His mask is now breaking due to wear and tear.  He would like new CPAP supplies.  I will be happy to send the order in for this.  He recently had lab work that confirmed the progression of his prediabetes Lab on 11/27/2023  Component Date Value Ref Range Status   WBC 11/27/2023 8.7  3.8 - 10.8 Thousand/uL Final   RBC 11/27/2023 4.66  4.20 - 5.80 Million/uL Final   Hemoglobin 11/27/2023 14.4  13.2 - 17.1 g/dL Final   HCT 91/87/7974 44.0  38.5 - 50.0 % Final   MCV 11/27/2023 94.4  80.0 - 100.0 fL Final   MCH 11/27/2023 30.9  27.0 - 33.0 pg Final   MCHC 11/27/2023 32.7  32.0 - 36.0 g/dL Final   Comment: For adults, a slight decrease in the calculated MCHC value (in the range of 30 to 32 g/dL) is most likely not clinically significant; however, it should be interpreted with caution in correlation with other red cell parameters and the patient's clinical condition.    RDW 11/27/2023 13.1  11.0 - 15.0 % Final   Platelets 11/27/2023 232  140 - 400 Thousand/uL Final   MPV 11/27/2023 11.9  7.5 - 12.5 fL Final   Neutro Abs 11/27/2023 5,316  1,500 - 7,800 cells/uL Final   Absolute Lymphocytes 11/27/2023 2,340  850 - 3,900 cells/uL Final   Absolute Monocytes 11/27/2023 539  200 - 950 cells/uL Final   Eosinophils Absolute 11/27/2023 452  15 - 500 cells/uL Final   Basophils Absolute 11/27/2023 52  0 - 200 cells/uL Final   Neutrophils Relative % 11/27/2023 61.1  % Final   Total  Lymphocyte 11/27/2023 26.9  % Final   Monocytes Relative 11/27/2023 6.2  % Final   Eosinophils Relative 11/27/2023 5.2  % Final   Basophils Relative 11/27/2023 0.6  % Final   Glucose, Bld 11/27/2023 105 (H)  65 - 99 mg/dL Final   Comment: .            Fasting reference interval . For someone without known diabetes, a glucose value between 100 and 125 mg/dL is consistent with prediabetes and should be confirmed with a follow-up test. .    BUN 11/27/2023 14  7 - 25 mg/dL Final   Creat 91/87/7974 0.99  0.70 - 1.28 mg/dL Final   BUN/Creatinine Ratio 11/27/2023 SEE NOTE:  6 - 22 (calc) Final   Comment:    Not Reported: BUN and Creatinine are within    reference range. .    Sodium 11/27/2023 141  135 - 146 mmol/L Final   Potassium 11/27/2023 4.6  3.5 - 5.3 mmol/L Final   Chloride 11/27/2023 107  98 - 110 mmol/L Final   CO2 11/27/2023 26  20 - 32 mmol/L Final   Calcium  11/27/2023 9.4  8.6 - 10.3 mg/dL Final   Total Protein 91/87/7974 6.5  6.1 - 8.1 g/dL Final   Albumin 91/87/7974 4.1  3.6 - 5.1 g/dL Final   Globulin 91/87/7974 2.4  1.9 - 3.7 g/dL (calc) Final   AG Ratio 11/27/2023 1.7  1.0 - 2.5 (calc) Final   Total Bilirubin 11/27/2023 0.6  0.2 - 1.2 mg/dL Final   Alkaline phosphatase (APISO) 11/27/2023 73  35 - 144 U/L Final   AST 11/27/2023 14  10 - 35 U/L Final   ALT 11/27/2023 17  9 - 46 U/L Final   Cholesterol 11/27/2023 140  <200 mg/dL Final   HDL 91/87/7974 50  > OR = 40 mg/dL Final   Triglycerides 91/87/7974 76  <150 mg/dL Final   LDL Cholesterol (Calc) 11/27/2023 74  mg/dL (calc) Final   Comment: Reference range: <100 . Desirable range <100 mg/dL for primary prevention;   <70 mg/dL for patients with CHD or diabetic patients  with > or = 2 CHD risk factors. SABRA LDL-C is now calculated using the Martin-Hopkins  calculation, which is a validated novel method providing  better accuracy than the Friedewald equation in the  estimation of LDL-C.  Gladis APPLETHWAITE et al. SANDREA.  7986;689(80): 2061-2068  (http://education.[facility].com/faq/FAQ164)    Total CHOL/HDL Ratio 11/27/2023 2.8  <4.9 (calc) Final   Non-HDL Cholesterol (Calc) 11/27/2023 90  <130 mg/dL (calc) Final   Comment: For patients with diabetes plus 1 major ASCVD risk  factor, treating to a non-HDL-C goal of <100 mg/dL  (LDL-C of <29 mg/dL) is considered a therapeutic  option.    Hgb A1c MFr Bld 11/27/2023 6.2 (H)  <5.7 % Final   Comment: For someone without known diabetes, a hemoglobin  A1c value between 5.7% and 6.4% is consistent with prediabetes and should be confirmed with a  follow-up test. . For someone with known diabetes, a value <7% indicates that their diabetes is well controlled. A1c targets should be individualized based on duration of diabetes, age, comorbid conditions, and other considerations. . This assay result is consistent with an increased risk of diabetes. . Currently, no consensus exists regarding use of hemoglobin A1c for diagnosis of diabetes for children. .    Mean Plasma Glucose 11/27/2023 131  mg/dL Final   eAG (mmol/L) 91/87/7974 7.3  mmol/L Final   Thankfully his cholesterol was well-controlled.  Unfortunately his weight continues to be a severe problem.  He has COPD as well.  His weight contributes both to his COPD, his dyspnea on exertion, and his sleep apnea.  He would dramatically benefit from weight loss. Past Medical History:  Diagnosis Date   Arthritis    knees   Asthma    CAD (coronary artery disease)    COPD (chronic obstructive pulmonary disease) (HCC)    Diastolic dysfunction    GERD (gastroesophageal reflux disease)    Hyperlipidemia    Hypertension    Obesity    OSA on CPAP    ahi-84, 18 CWP   Sleep apnea    wears cpap    Smoker    Umbilical hernia    Past Surgical History:  Procedure Laterality Date   CARDIAC CATHETERIZATION     COLONOSCOPY  2007   INSERTION OF MESH N/A 12/16/2020   Procedure: INSERTION OF MESH;  Surgeon:  Vernetta Berg, MD;  Location: [facility] OR;  Service: General;  Laterality: N/A;   LEFT HEART CATH AND CORONARY ANGIOGRAPHY N/A 10/12/2020   Procedure: LEFT HEART CATH AND CORONARY ANGIOGRAPHY;  Surgeon: Elmira Newman PARAS, MD;  Location: [facility] INVASIVE [facility];  Service: Cardiovascular;  Laterality: N/A;   TONSILLECTOMY  1967   UMBILICAL HERNIA REPAIR N/A 12/16/2020   Procedure: UMBILICAL HERNIA REPAIR WITH MESH;  Surgeon: Vernetta Berg, MD;  Location: [facility] OR;  Service: General;  Laterality: N/A;  LMA     Allergies  Allergen Reactions   Penicillins Swelling   Social History   Socioeconomic History   Marital status: Married    Spouse name: [name]   Number of children: 4   Years of education: Not on file   Highest education level: Associate degree: occupational, Scientist, product/process development, or vocational program  Occupational History   Occupation: Retired  Tobacco Use   Smoking status: Former    Current packs/day: 0.00    Average packs/day: 0.5 packs/day for 20.0 years (10.0 ttl pk-yrs)    Types: Cigarettes    Start date: 02/17/1999    Quit date: 02/17/2019    Years since quitting: 4.8   Smokeless tobacco: Never  Vaping Use   Vaping status: Never Used  Substance and Sexual Activity   Alcohol use: No    Alcohol/week: 0.0 standard drinks of alcohol   Drug use: No   Sexual activity: Yes    Birth control/protection: Condom  Other Topics Concern   Not on file  Social History Narrative   4 children, 18 grandchildren.   Social Drivers of Corporate investment banker Strain: Low Risk  (11/29/2023)   Overall Financial Resource Strain (CARDIA)    Difficulty of Paying Living Expenses: Not very hard  Food Insecurity: No Food Insecurity (11/29/2023)   Hunger Vital Sign    Worried About Running Out of Food in the Last Year: Never true    Ran Out of Food in the Last Year: Never true  Transportation Needs: No Transportation Needs (11/29/2023)   PRAPARE - Administrator, Civil Service (Medical):  No    Lack of Transportation (Non-Medical): No  Physical Activity: Insufficiently Active (11/29/2023)   Exercise Vital Sign    Days of Exercise per Week: 4 days    Minutes of Exercise per Session: 30 min  Stress: No Stress Concern Present (11/29/2023)   [employer] of Occupational Health - Occupational Stress Questionnaire    Feeling of Stress: Not at all  Social Connections: Moderately Isolated (11/29/2023)   Social Connection and Isolation Panel    Frequency of Communication with Friends and Family: Twice a week    Frequency of Social Gatherings with Friends and Family: Twice a week    Attends Religious Services: Never    Database administrator or Organizations: No    Attends Engineer, structural: Not on file    Marital Status: Married  Catering manager Violence: Not At Risk (04/26/2022)   Humiliation, Afraid, Rape, and Kick questionnaire    Fear of Current or Ex-Partner: No    Emotionally Abused: No    Physically Abused: No    Sexually Abused: No      Review of Systems  All other systems reviewed and are negative.      Objective:   Physical Exam Vitals reviewed.  Constitutional:      General: He is not in acute distress.    Appearance: Normal appearance. He is obese. He is not ill-appearing or toxic-appearing.  HENT:     Right Ear: Tympanic membrane and ear canal normal.     Left Ear: Tympanic membrane and ear canal normal.     Nose: No congestion or rhinorrhea.     Mouth/Throat:  Pharynx: No oropharyngeal exudate or posterior oropharyngeal erythema.  Cardiovascular:     Rate and Rhythm: Normal rate and regular rhythm.     Heart sounds: Normal heart sounds. No murmur heard. Pulmonary:     Effort: Pulmonary effort is normal. No accessory muscle usage or respiratory distress.     Breath sounds: Decreased air movement present. Decreased breath sounds, wheezing and rhonchi present. No rales.  Abdominal:     General: Bowel sounds are normal. There is no  distension.     Palpations: Abdomen is soft.  Musculoskeletal:     Right lower leg: No edema.     Left lower leg: No edema.  Neurological:     Mental Status: He is alert.       Chronic obstructive pulmonary disease, unspecified COPD type (HCC) - Plan: albuterol  (VENTOLIN  HFA) 108 (90 Base) MCG/ACT inhaler, tirzepatide  (ZEPBOUND ) 2.5 MG/0.5ML Pen  Leg edema - Plan: furosemide  (LASIX ) 20 MG tablet  Prediabetes - Plan: tirzepatide  (ZEPBOUND ) 2.5 MG/0.5ML Pen  OSA on CPAP - Plan: For home use only DME continuous positive airway pressure (CPAP) I believe the best thing for the patient's long-term mortality would be to lose weight.  He has sleep apnea as well as COPD.  His weight negatively impacts both of these.  He also has prediabetes.  I recommended trying Zepbound  2.5 mg subcu weekly and uptitrating as tolerated.  I will get a prescription sent over so that the patient can get new CPAP supplies.  His COPD is stable, continue breztri .  The remainder of his labs look excellent

## 2023-12-07 ENCOUNTER — Other Ambulatory Visit (HOSPITAL_COMMUNITY): Payer: Self-pay

## 2023-12-07 MED ORDER — ALBUTEROL SULFATE HFA 108 (90 BASE) MCG/ACT IN AERS
2.0000 | INHALATION_SPRAY | Freq: Four times a day (QID) | RESPIRATORY_TRACT | 3 refills | Status: AC | PRN
  Filled 2023-12-07: qty 6.7, 30d supply, fill #0
  Filled 2024-01-07: qty 6.7, 25d supply, fill #0
  Filled 2024-02-26: qty 6.7, 25d supply, fill #1

## 2023-12-07 MED ORDER — FUROSEMIDE 20 MG PO TABS
20.0000 mg | ORAL_TABLET | ORAL | 1 refills | Status: DC | PRN
  Filled 2023-12-07: qty 90, 90d supply, fill #0

## 2023-12-08 ENCOUNTER — Encounter (HOSPITAL_COMMUNITY): Payer: Self-pay

## 2023-12-08 ENCOUNTER — Other Ambulatory Visit (HOSPITAL_COMMUNITY): Payer: Self-pay

## 2023-12-10 ENCOUNTER — Other Ambulatory Visit: Payer: Self-pay

## 2023-12-10 ENCOUNTER — Other Ambulatory Visit (HOSPITAL_COMMUNITY): Payer: Self-pay

## 2023-12-11 ENCOUNTER — Other Ambulatory Visit (HOSPITAL_COMMUNITY): Payer: Self-pay

## 2023-12-13 DIAGNOSIS — G4733 Obstructive sleep apnea (adult) (pediatric): Secondary | ICD-10-CM | POA: Diagnosis not present

## 2023-12-15 MED FILL — Lisinopril Tab 20 MG: ORAL | 90 days supply | Qty: 90 | Fill #4 | Status: AC

## 2023-12-16 ENCOUNTER — Other Ambulatory Visit (HOSPITAL_COMMUNITY): Payer: Self-pay

## 2023-12-24 ENCOUNTER — Telehealth: Payer: Self-pay

## 2023-12-24 NOTE — Telephone Encounter (Signed)
 Copied from CRM [id number]. Topic: Clinical - Medication Question >> Dec 24, 2023 12:36 PM Sophia H wrote: Reason for CRM: wants to know if zepbound  can be swapped for mounjaro  - patients wife states patient is paying $1200+ for the zepbound , mounjaro  would only cost $47. Please advise, patients wife states may require a PA. # [phone]

## 2023-12-25 ENCOUNTER — Other Ambulatory Visit: Payer: Self-pay | Admitting: Family Medicine

## 2023-12-25 ENCOUNTER — Other Ambulatory Visit: Payer: Self-pay

## 2023-12-25 ENCOUNTER — Other Ambulatory Visit (HOSPITAL_COMMUNITY): Payer: Self-pay

## 2023-12-25 MED ORDER — TIRZEPATIDE 5 MG/0.5ML ~~LOC~~ SOAJ
5.0000 mg | SUBCUTANEOUS | 2 refills | Status: DC
  Filled 2023-12-25 – 2023-12-31 (×2): qty 6, 84d supply, fill #0
  Filled 2024-01-04 – 2024-01-07 (×3): qty 2, 28d supply, fill #0

## 2023-12-26 ENCOUNTER — Other Ambulatory Visit (HOSPITAL_COMMUNITY): Payer: Self-pay

## 2023-12-27 ENCOUNTER — Encounter: Payer: Self-pay | Admitting: Family Medicine

## 2023-12-31 ENCOUNTER — Encounter: Payer: Self-pay | Admitting: Family Medicine

## 2023-12-31 ENCOUNTER — Telehealth: Payer: Self-pay | Admitting: Pharmacy Technician

## 2023-12-31 ENCOUNTER — Other Ambulatory Visit (HOSPITAL_COMMUNITY): Payer: Self-pay

## 2023-12-31 NOTE — Telephone Encounter (Signed)
 Pharmacy Patient Advocate Encounter   Received notification from Onbase that prior authorization for Mounjaro  5MG /0.5ML auto-injectors  is required/requested.   Insurance verification completed.   The patient is insured through [facility] ADVANTAGE/RX ADVANCE .   Per test claim: PA required; PA submitted to above mentioned insurance via Latent Key/confirmation #/EOC [id number] Status is pending

## 2023-12-31 NOTE — Telephone Encounter (Signed)
 Pharmacy Patient Advocate Encounter  Received notification from [facility] ADVANTAGE/RX ADVANCE that Prior Authorization for Mounjaro  5MG /0.5ML auto-injectors  has been DENIED.  Full denial letter will be uploaded to the media tab. See denial reason below.     PA #/Case ID/Reference #: [id number]

## 2024-01-01 ENCOUNTER — Telehealth: Payer: Self-pay

## 2024-01-01 NOTE — Telephone Encounter (Signed)
 Dr. Duanne is asking if an appeal can be sent for the pt's Mounjaro  as copied and pasted below. Thank you.   Patient is taking the medication for sleep apnea which is an approved indication for mounjaro  for medicare. Can we call and appeal?   12/31/23  3:52 PM You routed this conversation to Duanne Butler DASEN, MD Carlin FORBES Sharps to [facility] (supporting Butler DASEN Duanne, MD)     12/31/23  3:14 PM They did leave a [phone]--[phone] Carlin FORBES Sharps to [facility] (supporting Butler DASEN Duanne, MD)     12/31/23  3:12 PM Hi Doc. The insurance denied again. I wasn't sure if you wanted to appeal or not.   Both meds are about the same price.  So, if I have to pay do you want to adjust the Zepbound  up or go with Mounjaro  , whichever is best for my situation??

## 2024-01-02 NOTE — Telephone Encounter (Signed)
 Good morning MJ, yes I just routed this to our [facility] who handle our appeals

## 2024-01-02 NOTE — Telephone Encounter (Signed)
 Ooops sorry MJ, our Rph just reminded me that     So no we will not be able to appeal Mounjaro  denial

## 2024-01-04 ENCOUNTER — Other Ambulatory Visit (HOSPITAL_COMMUNITY): Payer: Self-pay

## 2024-01-07 ENCOUNTER — Other Ambulatory Visit: Payer: Self-pay

## 2024-01-07 ENCOUNTER — Other Ambulatory Visit (HOSPITAL_COMMUNITY): Payer: Self-pay

## 2024-01-20 ENCOUNTER — Encounter: Payer: Self-pay | Admitting: Family Medicine

## 2024-01-22 ENCOUNTER — Other Ambulatory Visit (HOSPITAL_COMMUNITY): Payer: Self-pay

## 2024-01-22 ENCOUNTER — Other Ambulatory Visit: Payer: Self-pay

## 2024-01-22 ENCOUNTER — Telehealth (HOSPITAL_COMMUNITY): Payer: Self-pay | Admitting: Pharmacy Technician

## 2024-01-22 MED ORDER — TIRZEPATIDE 7.5 MG/0.5ML ~~LOC~~ SOAJ
7.5000 mg | SUBCUTANEOUS | 1 refills | Status: DC
  Filled 2024-01-22: qty 6, 84d supply, fill #0

## 2024-01-23 ENCOUNTER — Other Ambulatory Visit (HOSPITAL_COMMUNITY): Payer: Self-pay

## 2024-01-23 ENCOUNTER — Telehealth (HOSPITAL_COMMUNITY): Payer: Self-pay

## 2024-01-23 NOTE — Telephone Encounter (Signed)
 Ozempic /Mounjaro  is approved exclusively as an adjunct to diet and exercise to improve glycemic  control in adults with type 2 diabetes mellitus. A review of patient's medical chart reveals no  documented diagnosis of type 2 diabetes or an A1C indicative of diabetes. Therefore, they do not  currently meet the criteria for prior authorization of this medication. If clinically appropriate, alternative  options such as Saxenda, Zepbound , or Wegovy  may be considered for this patient.  *Mounjaro  is not approved for sleep apnea. Only Zepbound  is, please consider changing therapies.

## 2024-01-24 ENCOUNTER — Other Ambulatory Visit: Payer: Self-pay | Admitting: Family Medicine

## 2024-01-24 ENCOUNTER — Other Ambulatory Visit: Payer: Self-pay

## 2024-01-24 ENCOUNTER — Other Ambulatory Visit (HOSPITAL_COMMUNITY): Payer: Self-pay

## 2024-01-24 MED ORDER — ZEPBOUND 2.5 MG/0.5ML ~~LOC~~ SOAJ
2.5000 mg | SUBCUTANEOUS | 2 refills | Status: DC
  Filled 2024-01-24: qty 2, 28d supply, fill #0

## 2024-01-24 MED ORDER — TIRZEPATIDE-WEIGHT MANAGEMENT 2.5 MG/0.5ML ~~LOC~~ SOLN
2.5000 mg | SUBCUTANEOUS | 3 refills | Status: DC
  Filled 2024-01-24: qty 2, 28d supply, fill #0

## 2024-01-24 MED ORDER — ZEPBOUND 7.5 MG/0.5ML ~~LOC~~ SOAJ
7.5000 mg | SUBCUTANEOUS | 3 refills | Status: DC
  Filled 2024-01-24: qty 2, 28d supply, fill #0

## 2024-01-25 ENCOUNTER — Other Ambulatory Visit (HOSPITAL_COMMUNITY): Payer: Self-pay

## 2024-01-28 ENCOUNTER — Other Ambulatory Visit (HOSPITAL_BASED_OUTPATIENT_CLINIC_OR_DEPARTMENT_OTHER): Payer: Self-pay

## 2024-01-28 MED ORDER — FLUZONE HIGH-DOSE 0.5 ML IM SUSY
0.5000 mL | PREFILLED_SYRINGE | Freq: Once | INTRAMUSCULAR | 0 refills | Status: AC
  Filled 2024-01-28: qty 0.5, 1d supply, fill #0

## 2024-01-28 MED ORDER — COMIRNATY 30 MCG/0.3ML IM SUSY
0.3000 mL | PREFILLED_SYRINGE | Freq: Once | INTRAMUSCULAR | 0 refills | Status: AC
  Filled 2024-01-28: qty 0.3, 1d supply, fill #0

## 2024-02-21 ENCOUNTER — Ambulatory Visit: Admitting: *Deleted

## 2024-02-21 ENCOUNTER — Telehealth: Payer: Self-pay | Admitting: Family Medicine

## 2024-02-21 VITALS — Ht 70.0 in | Wt 308.0 lb

## 2024-02-21 DIAGNOSIS — Z0000 Encounter for general adult medical examination without abnormal findings: Secondary | ICD-10-CM | POA: Diagnosis not present

## 2024-02-21 NOTE — Progress Notes (Signed)
 Subjective:   James Branch is a 68 y.o. male who presents for a Medicare Annual Wellness Visit.  I connected with  James Branch on 02/21/24 by a audio enabled telemedicine application and verified that I am speaking with the correct person using two identifiers.  Patient Location: Home  Provider Location: Home Office  I discussed the limitations of evaluation and management by telemedicine. The patient expressed understanding and agreed to proceed.   Allergies (verified) Penicillins   History: Past Medical History:  Diagnosis Date   Arthritis    knees   Asthma    CAD (coronary artery disease)    COPD (chronic obstructive pulmonary disease) (HCC)    Diastolic dysfunction    GERD (gastroesophageal reflux disease)    Hyperlipidemia    Hypertension    Obesity    OSA on CPAP    ahi-84, 18 CWP   Sleep apnea    wears cpap    Smoker    Umbilical hernia    Past Surgical History:  Procedure Laterality Date   CARDIAC CATHETERIZATION     COLONOSCOPY  2007   INSERTION OF MESH N/A 12/16/2020   Procedure: INSERTION OF MESH;  Surgeon: Vernetta Berg, MD;  Location: [facility] OR;  Service: General;  Laterality: N/A;   LEFT HEART CATH AND CORONARY ANGIOGRAPHY N/A 10/12/2020   Procedure: LEFT HEART CATH AND CORONARY ANGIOGRAPHY;  Surgeon: Elmira Newman PARAS, MD;  Location: [facility] INVAS[facility] CV LAB;  Service: Cardiovascular;  Laterality: N/A;   TONSILLECTOMY  1967   UMBILICAL HERNIA REPAIR N/A 12/16/2020   Procedure: UMBILICAL HERNIA REPAIR WITH MESH;  Surgeon: Vernetta Berg, MD;  Location: [facility] OR;  Service: General;  Laterality: N/A;  LMA   Family History  Problem Relation Age of Onset   Hypertension Mother    Hypertension Father    Throat cancer Father    Colon cancer Neg Hx    Colon polyps Neg Hx    Esophageal cancer Neg Hx    Rectal cancer Neg Hx    Stomach cancer Neg Hx    Social History   Occupational History   Occupation: Retired  Tobacco Use   Smoking status: Former     Current packs/day: 0.00    Average packs/day: 0.5 packs/day for 20.0 years (10.0 ttl pk-yrs)    Types: Cigarettes    Start date: 02/17/1999    Quit date: 02/17/2019    Years since quitting: 5.0   Smokeless tobacco: Never  Vaping Use   Vaping status: Never Used  Substance and Sexual Activity   Alcohol use: No    Alcohol/week: 0.0 standard drinks of alcohol   Drug use: No   Sexual activity: Yes    Birth control/protection: Condom   Tobacco Counseling Counseling given: Not Answered  SDOH Screenings   Food Insecurity: No Food Insecurity (02/21/2024)  Housing: Unknown (02/21/2024)  Transportation Needs: No Transportation Needs (02/21/2024)  Utilities: Not At Risk (02/21/2024)  Alcohol Screen: Low Risk  (04/26/2022)  Depression (PHQ2-9): Low Risk  (02/21/2024)  Financial Resource Strain: Low Risk  (11/29/2023)  Physical Activity: Insufficiently Active (02/21/2024)  Social Connections: Moderately Integrated (02/21/2024)  Recent Concern: Social Connections - Moderately Isolated (11/29/2023)  Stress: No Stress Concern Present (02/21/2024)  Tobacco Use: Medium Risk (02/21/2024)  Health Literacy: Adequate Health Literacy (02/21/2024)   Depression Screen    02/21/2024    3:39 PM 04/02/2023    9:30 AM 09/28/2022   11:03 AM 04/26/2022    8:56 AM 03/23/2022  8:42 AM 03/22/2021   12:11 PM 02/10/2021    9:03 AM  PHQ 2/9 Scores  PHQ - 2 Score 0 0 0 0 0 0 0  PHQ- 9 Score 0     2       Data saved with a previous flowsheet row definition     Goals Addressed             This Visit's Progress    Weight (lb) < 200 lb (90.7 kg)   308 lb (139.7 kg)      Visit info / Clinical Intake: Medicare Wellness Visit Mode:: Telephone If telephone:: video declined Interpreter Needed?: No Pre-visit prep was completed: no AWV questionnaire completed by patient prior to visit?: no Living arrangements:: lives with spouse/significant other Patient's Overall Health Status Rating: good Typical amount of  pain: none Does pain affect daily life?: no Are you currently prescribed opioids?: no  Dietary Habits and Nutritional Risks How many meals a day?: 2 Eats fruit and vegetables daily?: yes Most meals are obtained by: preparing own meals Diabetic:: no  Functional Status Activities of Daily Living (to include ambulation/medication): Independent Ambulation: Independent Medication Administration: Independent Home Management: Independent Manage your own finances?: yes Primary transportation is: driving Concerns about vision?: no *vision screening is required for WTM* Concerns about hearing?: (!) yes Uses hearing aids?: (!) yes Hear whispered voice?: (!) no *in-person visit only*  Fall Screening Falls in the past year?: 0 Number of falls in past year: 0 Was there an injury with Fall?: 0 Fall Risk Category Calculator: 0 Patient Fall Risk Level: Low Fall Risk  Fall Risk Patient at Risk for Falls Due to: No Fall Risks Fall risk Follow up: Falls evaluation completed; Education provided; Falls prevention discussed  Home and Transportation Safety: All rugs have non-skid backing?: yes All stairs or steps have railings?: (!) no Grab bars in the bathtub or shower?: (!) no Have non-skid surface in bathtub or shower?: (!) no Good home lighting?: yes Regular seat belt use?: yes Hospital stays in the last year:: no  Cognitive Assessment Difficulty concentrating, remembering, or making decisions? : no Will 6CIT or Mini Cog be Completed: yes What year is it?: 0 points What month is it?: 0 points Give patient an address phrase to remember (5 components): Its very sunny outside today in November About what time is it?: 0 points Count backwards from 20 to 1: 0 points Say the months of the year in reverse: 0 points Repeat the address phrase from earlier: 0 points 6 CIT Score: 0 points  Advance Directives (For Healthcare) Does Patient Have a Medical Advance Directive?: No Would patient  like information on creating a medical advance directive?: No - Patient declined        Objective:    Today's Vitals   02/21/24 1535  Weight: (!) 308 lb (139.7 kg)  Height: 5' 10 (1.778 m)   Body mass index is 44.19 kg/m.  Current Medications (verified) Outpatient Encounter Medications as of 02/21/2024  Medication Sig   albuterol  (VENTOLIN  HFA) 108 (90 Base) MCG/ACT inhaler Inhale 2 puffs into the lungs every 6 (six) hours as needed for wheezing or shortness of breath.   albuterol  (VENTOLIN  HFA) 108 (90 Base) MCG/ACT inhaler Inhale 2 puffs into the lungs every 6 (six) hours as needed for wheezing or shortness of breath.   atorvastatin  (LIPITOR) 20 MG tablet Take 1 tablet (20 mg total) by mouth daily.   budesonide -glycopyrrolate -formoterol  (BREZTRI ) 160-9-4.8 MCG/ACT AERO inhaler Inhale 2 puffs  into the lungs 2 (two) times daily.   esomeprazole  (NEXIUM ) 40 MG capsule Take 1 capsule (40 mg total) by mouth daily. Dispense generic   furosemide  (LASIX ) 20 MG tablet Take 1 tablet (20 mg total) by mouth as needed for fluid.   furosemide  (LASIX ) 20 MG tablet Take 1 tablet (20 mg total) by mouth as needed for fluid.   lisinopril  (ZESTRIL ) 20 MG tablet Take 1 tablet (20 mg total) by mouth daily.   nitroGLYCERIN  (NITROSTAT ) 0.4 MG SL tablet Place 1 tablet (0.4 mg total) under the tongue every 5 (five) minutes as needed for chest pain.   sildenafil  (VIAGRA ) 50 MG tablet Take 50 mg by mouth daily as needed.   tirzepatide  (ZEPBOUND ) 7.5 MG/0.5ML Pen Inject 7.5 mg into the skin once a week.   No facility-administered encounter medications on file as of 02/21/2024.   Hearing/Vision screen Hearing Screening - Comments:: Bilateral hearing aids Vision Screening - Comments:: [facility] Up to date Immunizations and Health Maintenance Health Maintenance  Topic Date Due   COVID-19 Vaccine (10 - Pfizer risk 2025-26 season) 07/28/2024   DTaP/Tdap/Td (2 - Td or Tdap) 10/25/2024   Medicare Annual  Wellness (AWV)  02/20/2025   Colonoscopy  03/04/2026   Pneumococcal Vaccine: 50+ Years  Completed   Influenza Vaccine  Completed   Hepatitis C Screening  Completed   Zoster Vaccines- Shingrix   Completed   Meningococcal B Vaccine  Aged Out        Assessment/Plan:  This is a routine wellness examination for [place].  Patient Care Team: Duanne Butler DASEN, MD as PCP - General (Family Medicine) Elmira Newman PARAS, MD as PCP - Cardiology (Cardiology) Nicholaus Sherlean CROME, [facility] (Inactive) as Pharmacist (Pharmacist)  I have personally reviewed and noted the following in the patient's chart:   Medical and social history Use of alcohol, tobacco or illicit drugs  Current medications and supplements including opioid prescriptions. Functional ability and status Nutritional status Physical activity Advanced directives List of other physicians Hospitalizations, surgeries, and ER visits in previous 12 months Vitals Screenings to include cognitive, depression, and falls Referrals and appointments  No orders of the defined types were placed in this encounter.  In addition, I have reviewed and discussed with patient certain preventive protocols, quality metrics, and best practice recommendations. A written personalized care plan for preventive services as well as general preventive health recommendations were provided to patient.   Mliss Graff, LPN   88/06/7972   Return in 1 year (on 02/20/2025).  After Visit Summary: (MyChart) Due to this being a telephonic visit, the after visit summary with patients personalized plan was offered to patient via MyChart   Nurse Notes:

## 2024-02-21 NOTE — Patient Instructions (Signed)
 James Branch , Thank you for taking time to come for your Medicare Wellness Visit. I appreciate your ongoing commitment to your health goals. Please review the following plan we discussed and let me know if I can assist you in the future.   Screening recommendations/referrals: Colonoscopy:  Recommended yearly ophthalmology/optometry visit for glaucoma screening and checkup Recommended yearly dental visit for hygiene and checkup  Vaccinations: Influenza vaccine:  Pneumococcal vaccine:  Tdap vaccine:  Shingles vaccine:        Preventive Care 65 Years and Older, Male Preventive care refers to lifestyle choices and visits with your health care provider that can promote health and wellness. What does preventive care include? A yearly physical exam. This is also called an annual well check. Dental exams once or twice a year. Routine eye exams. Ask your health care provider how often you should have your eyes checked. Personal lifestyle choices, including: Daily care of your teeth and gums. Regular physical activity. Eating a healthy diet. Avoiding tobacco and drug use. Limiting alcohol use. Practicing safe sex. Taking low doses of aspirin  every day. Taking vitamin and mineral supplements as recommended by your health care provider. What happens during an annual well check? The services and screenings done by your health care provider during your annual well check will depend on your age, overall health, lifestyle risk factors, and family history of disease. Counseling  Your health care provider may ask you questions about your: Alcohol use. Tobacco use. Drug use. Emotional well-being. Home and relationship well-being. Sexual activity. Eating habits. History of falls. Memory and ability to understand (cognition). Work and work astronomer. Screening  You may have the following tests or measurements: Height, weight, and BMI. Blood pressure. Lipid and cholesterol levels. These  may be checked every 5 years, or more frequently if you are over 70 years old. Skin check. Lung cancer screening. You may have this screening every year starting at age 70 if you have a 30-pack-year history of smoking and currently smoke or have quit within the past 15 years. Fecal occult blood test (FOBT) of the stool. You may have this test every year starting at age 70. Flexible sigmoidoscopy or colonoscopy. You may have a sigmoidoscopy every 5 years or a colonoscopy every 10 years starting at age 70. Prostate cancer screening. Recommendations will vary depending on your family history and other risks. Hepatitis C blood test. Hepatitis B blood test. Sexually transmitted disease (STD) testing. Diabetes screening. This is done by checking your blood sugar (glucose) after you have not eaten for a while (fasting). You may have this done every 1-3 years. Abdominal aortic aneurysm (AAA) screening. You may need this if you are a current or former smoker. Osteoporosis. You may be screened starting at age 70 if you are at high risk. Talk with your health care provider about your test results, treatment options, and if necessary, the need for more tests. Vaccines  Your health care provider may recommend certain vaccines, such as: Influenza vaccine. This is recommended every year. Tetanus, diphtheria, and acellular pertussis (Tdap, Td) vaccine. You may need a Td booster every 10 years. Zoster vaccine. You may need this after age 32. Pneumococcal 13-valent conjugate (PCV13) vaccine. One dose is recommended after age 64. Pneumococcal polysaccharide (PPSV23) vaccine. One dose is recommended after age 52. Talk to your health care provider about which screenings and vaccines you need and how often you need them. This information is not intended to replace advice given to you by your health  care provider. Make sure you discuss any questions you have with your health care provider. Document Released:  04/30/2015 Document Revised: 12/22/2015 Document Reviewed: 02/02/2015 Elsevier Interactive Patient Education  2017 [employer].  Fall Prevention in the Home Falls can cause injuries. They can happen to people of all ages. There are many things you can do to make your home safe and to help prevent falls. What can I do on the outside of my home? Regularly fix the edges of walkways and driveways and fix any cracks. Remove anything that might make you trip as you walk through a door, such as a raised step or threshold. Trim any bushes or trees on the path to your home. Use bright outdoor lighting. Clear any walking paths of anything that might make someone trip, such as rocks or tools. Regularly check to see if handrails are loose or broken. Make sure that both sides of any steps have handrails. Any raised decks and porches should have guardrails on the edges. Have any leaves, snow, or ice cleared regularly. Use sand or salt on walking paths during winter. Clean up any spills in your garage right away. This includes oil or grease spills. What can I do in the bathroom? Use night lights. Install grab bars by the toilet and in the tub and shower. Do not use towel bars as grab bars. Use non-skid mats or decals in the tub or shower. If you need to sit down in the shower, use a plastic, non-slip stool. Keep the floor dry. Clean up any water  that spills on the floor as soon as it happens. Remove soap buildup in the tub or shower regularly. Attach bath mats securely with double-sided non-slip rug tape. Do not have throw rugs and other things on the floor that can make you trip. What can I do in the bedroom? Use night lights. Make sure that you have a light by your bed that is easy to reach. Do not use any sheets or blankets that are too big for your bed. They should not hang down onto the floor. Have a firm chair that has side arms. You can use this for support while you get dressed. Do not have  throw rugs and other things on the floor that can make you trip. What can I do in the kitchen? Clean up any spills right away. Avoid walking on wet floors. Keep items that you use a lot in easy-to-reach places. If you need to reach something above you, use a strong step stool that has a grab bar. Keep electrical cords out of the way. Do not use floor polish or wax that makes floors slippery. If you must use wax, use non-skid floor wax. Do not have throw rugs and other things on the floor that can make you trip. What can I do with my stairs? Do not leave any items on the stairs. Make sure that there are handrails on both sides of the stairs and use them. Fix handrails that are broken or loose. Make sure that handrails are as long as the stairways. Check any carpeting to make sure that it is firmly attached to the stairs. Fix any carpet that is loose or worn. Avoid having throw rugs at the top or bottom of the stairs. If you do have throw rugs, attach them to the floor with carpet tape. Make sure that you have a light switch at the top of the stairs and the bottom of the stairs. If you do not  have them, ask someone to add them for you. What else can I do to help prevent falls? Wear shoes that: Do not have high heels. Have rubber bottoms. Are comfortable and fit you well. Are closed at the toe. Do not wear sandals. If you use a stepladder: Make sure that it is fully opened. Do not climb a closed stepladder. Make sure that both sides of the stepladder are locked into place. Ask someone to hold it for you, if possible. Clearly mark and make sure that you can see: Any grab bars or handrails. First and last steps. Where the edge of each step is. Use tools that help you move around (mobility aids) if they are needed. These include: Canes. Walkers. Scooters. Crutches. Turn on the lights when you go into a dark area. Replace any light bulbs as soon as they burn out. Set up your furniture so  you have a clear path. Avoid moving your furniture around. If any of your floors are uneven, fix them. If there are any pets around you, be aware of where they are. Review your medicines with your doctor. Some medicines can make you feel dizzy. This can increase your chance of falling. Ask your doctor what other things that you can do to help prevent falls. This information is not intended to replace advice given to you by your health care provider. Make sure you discuss any questions you have with your health care provider. Document Released: 01/28/2009 Document Revised: 09/09/2015 Document Reviewed: 05/08/2014 Elsevier Interactive Patient Education  2017 [employer].

## 2024-02-21 NOTE — Telephone Encounter (Signed)
 Copied from CRM #[id number]. Topic: General - Call Back - No Documentation >> Feb 21, 2024 11:26 AM Viola F wrote: Reason for CRM: Patient says he received phone call this morning from office but doesn't know what it's regarding - his AWV isn't until 4pm

## 2024-02-26 ENCOUNTER — Encounter: Payer: Self-pay | Admitting: Family Medicine

## 2024-02-26 ENCOUNTER — Other Ambulatory Visit (HOSPITAL_COMMUNITY): Payer: Self-pay

## 2024-02-26 ENCOUNTER — Other Ambulatory Visit: Payer: Self-pay | Admitting: Family Medicine

## 2024-02-26 MED ORDER — ZEPBOUND 10 MG/0.5ML ~~LOC~~ SOAJ
10.0000 mg | SUBCUTANEOUS | 3 refills | Status: DC
  Filled 2024-02-26 – 2024-02-27 (×2): qty 2, 28d supply, fill #0

## 2024-02-27 ENCOUNTER — Other Ambulatory Visit (HOSPITAL_BASED_OUTPATIENT_CLINIC_OR_DEPARTMENT_OTHER): Payer: Self-pay

## 2024-02-27 ENCOUNTER — Other Ambulatory Visit (HOSPITAL_COMMUNITY): Payer: Self-pay

## 2024-02-27 ENCOUNTER — Telehealth (HOSPITAL_COMMUNITY): Payer: Self-pay

## 2024-02-27 ENCOUNTER — Other Ambulatory Visit: Payer: Self-pay

## 2024-02-27 MED FILL — Atorvastatin Calcium Tab 20 MG (Base Equivalent): ORAL | 60 days supply | Qty: 60 | Fill #4 | Status: AC

## 2024-02-27 NOTE — Telephone Encounter (Signed)
 PA request has been Received. New Encounter has been or will be created for follow up. For additional info see Pharmacy Prior Auth telephone encounter from 02/27/24.

## 2024-02-27 NOTE — Telephone Encounter (Signed)
 Pharmacy Patient Advocate Encounter   Received notification from Pt Calls Messages that prior authorization for Zepbound  10 mg/0.5 ml auto injectors is required/requested.   Insurance verification completed.   The patient is insured through [facility] ADVANTAGE/RX ADVANCE.   Per test claim: Per test claim, medication is not covered due to plan/benefit exclusion, PA not submitted at this time

## 2024-02-28 ENCOUNTER — Other Ambulatory Visit: Payer: Self-pay

## 2024-02-28 ENCOUNTER — Other Ambulatory Visit: Payer: Self-pay | Admitting: Family Medicine

## 2024-02-28 ENCOUNTER — Other Ambulatory Visit (HOSPITAL_BASED_OUTPATIENT_CLINIC_OR_DEPARTMENT_OTHER): Payer: Self-pay

## 2024-02-28 ENCOUNTER — Other Ambulatory Visit (HOSPITAL_COMMUNITY): Payer: Self-pay

## 2024-02-28 MED ORDER — ZEPBOUND 10 MG/0.5ML ~~LOC~~ SOAJ
10.0000 mg | SUBCUTANEOUS | 3 refills | Status: DC
  Filled 2024-02-28 (×2): qty 2, 28d supply, fill #0

## 2024-02-28 MED ORDER — TIRZEPATIDE-WEIGHT MANAGEMENT 10 MG/0.5ML ~~LOC~~ SOLN
10.0000 mg | SUBCUTANEOUS | 3 refills | Status: DC
  Filled 2024-02-28: qty 2, 28d supply, fill #0

## 2024-03-05 ENCOUNTER — Other Ambulatory Visit (HOSPITAL_COMMUNITY): Payer: Self-pay

## 2024-03-05 ENCOUNTER — Other Ambulatory Visit: Payer: Self-pay | Admitting: Family Medicine

## 2024-03-05 DIAGNOSIS — K219 Gastro-esophageal reflux disease without esophagitis: Secondary | ICD-10-CM

## 2024-03-05 DIAGNOSIS — G4733 Obstructive sleep apnea (adult) (pediatric): Secondary | ICD-10-CM | POA: Diagnosis not present

## 2024-03-05 NOTE — Telephone Encounter (Signed)
 Copied from CRM #[id number]. Topic: Clinical - Medication Refill >> Mar 05, 2024  3:00 PM Everette C wrote: Medication: Rx #: [id number]  esomeprazole  (NEXIUM ) 40 MG capsule [[id number]]   Has the patient contacted their pharmacy? Yes (Agent: If no, request that the patient contact the pharmacy for the refill. If patient does not wish to contact the pharmacy document the reason why and proceed with request.) (Agent: If yes, when and what did the pharmacy advise?)  This is the patient's preferred pharmacy:  [facility] [place], [place] [address] [place] [address] Phone: [phone] Fax: [phone]  Is this the correct pharmacy for this prescription? Yes If no, delete pharmacy and type the correct one.   Has the prescription been filled recently? Yes  Is the patient out of the medication? Yes  Has the patient been seen for an appointment in the last year OR does the patient have an upcoming appointment? Yes  Can we respond through MyChart? No  Agent: Please be advised that Rx refills may take up to 3 business days. We ask that you follow-up with your pharmacy.

## 2024-03-06 ENCOUNTER — Other Ambulatory Visit (HOSPITAL_COMMUNITY): Payer: Self-pay

## 2024-03-07 ENCOUNTER — Other Ambulatory Visit: Payer: Self-pay

## 2024-03-07 MED ORDER — ESOMEPRAZOLE MAGNESIUM 40 MG PO CPDR: 40.0000 mg | DELAYED_RELEASE_CAPSULE | Freq: Every day | ORAL | 2 refills | Status: AC

## 2024-03-07 NOTE — Telephone Encounter (Signed)
 Requested Prescriptions  Pending Prescriptions Disp Refills   esomeprazole  (NEXIUM ) 40 MG capsule 100 capsule 2    Sig: Take 1 capsule (40 mg total) by mouth daily. Dispense generic     Gastroenterology: Proton Pump Inhibitors 2 Passed - 03/07/2024  3:55 PM      Passed - ALT in normal range and within 360 days    ALT  Date Value Ref Range Status  11/27/2023 17 9 - 46 U/L Final         Passed - AST in normal range and within 360 days    AST  Date Value Ref Range Status  11/27/2023 14 10 - 35 U/L Final         Passed - Valid encounter within last 12 months    Recent Outpatient Visits           3 months ago Chronic obstructive pulmonary disease, unspecified COPD type (HCC)   [place] [facility] [name], Butler DASEN, MD   10 months ago COPD exacerbation [facility])   [place] [facility] Duanne Butler DASEN, MD   11 months ago Screening for lung cancer   [place] [facility] Duanne Butler DASEN, MD   1 year ago Venous stasis dermatitis of both lower extremities   [place] [facility] Duanne Butler DASEN, MD   1 year ago Gastroesophageal reflux disease, unspecified whether esophagitis present   [place] [facility] [name], Butler DASEN, MD

## 2024-03-24 ENCOUNTER — Encounter: Payer: Self-pay | Admitting: Family Medicine

## 2024-03-25 ENCOUNTER — Other Ambulatory Visit: Payer: Self-pay | Admitting: Family Medicine

## 2024-03-25 MED ORDER — ZEPBOUND 12.5 MG/0.5ML ~~LOC~~ SOLN: 12.5000 mg | SUBCUTANEOUS | 3 refills | Status: DC

## 2024-03-30 ENCOUNTER — Encounter: Payer: Self-pay | Admitting: Family Medicine

## 2024-03-31 ENCOUNTER — Telehealth: Payer: Self-pay | Admitting: Pharmacy Technician

## 2024-03-31 ENCOUNTER — Other Ambulatory Visit (HOSPITAL_COMMUNITY): Payer: Self-pay

## 2024-03-31 ENCOUNTER — Other Ambulatory Visit: Payer: Self-pay

## 2024-03-31 MED ORDER — LISINOPRIL 20 MG PO TABS: 20.0000 mg | ORAL_TABLET | Freq: Every day | ORAL | 7 refills | Status: AC

## 2024-03-31 NOTE — Telephone Encounter (Signed)
 Pharmacy Patient Advocate Encounter   Received notification from Onbase that prior authorization for Zepbound  12.5MG /0.5ML pen-injectors is required/requested.   Insurance verification completed.   The patient is insured through [facility] ADVANTAGE/RX ADVANCE.   Per test claim: Product Not On Formulary

## 2024-04-01 ENCOUNTER — Other Ambulatory Visit: Payer: Self-pay | Admitting: Family Medicine

## 2024-04-02 ENCOUNTER — Other Ambulatory Visit

## 2024-04-02 ENCOUNTER — Other Ambulatory Visit: Payer: Self-pay

## 2024-04-02 DIAGNOSIS — J449 Chronic obstructive pulmonary disease, unspecified: Secondary | ICD-10-CM

## 2024-04-02 DIAGNOSIS — E782 Mixed hyperlipidemia: Secondary | ICD-10-CM

## 2024-04-02 DIAGNOSIS — J441 Chronic obstructive pulmonary disease with (acute) exacerbation: Secondary | ICD-10-CM

## 2024-04-02 DIAGNOSIS — I10 Essential (primary) hypertension: Secondary | ICD-10-CM

## 2024-04-02 DIAGNOSIS — Z0000 Encounter for general adult medical examination without abnormal findings: Secondary | ICD-10-CM

## 2024-04-02 DIAGNOSIS — R7303 Prediabetes: Secondary | ICD-10-CM

## 2024-04-02 DIAGNOSIS — Z125 Encounter for screening for malignant neoplasm of prostate: Secondary | ICD-10-CM

## 2024-04-02 MED ORDER — ZEPBOUND 12.5 MG/0.5ML ~~LOC~~ SOLN: 12.5000 mg | SUBCUTANEOUS | 3 refills | Status: DC

## 2024-04-02 NOTE — Telephone Encounter (Signed)
 First attempt to contact . Will try again later.

## 2024-04-03 LAB — LIPID PANEL
Cholesterol: 121 mg/dL (ref <200)
HDL: 47 mg/dL (ref >=40)
LDL Cholesterol (Calc): 58 mg/dL
Non-HDL Cholesterol (Calc): 74 mg/dL (ref <130)
Total CHOL/HDL Ratio: 2.6 (calc) (ref <5.0)
Triglycerides: 75 mg/dL (ref <150)

## 2024-04-03 LAB — COMPLETE METABOLIC PANEL WITHOUT GFR
AG Ratio: 1.4 (calc) (ref 1.0–2.5)
ALT: 15 U/L (ref 9–46)
AST: 12 U/L (ref 10–35)
Albumin: 3.9 g/dL (ref 3.6–5.1)
Alkaline phosphatase (APISO): 91 U/L (ref 35–144)
BUN: 10 mg/dL (ref 7–25)
CO2: 25 mmol/L (ref 20–32)
Calcium: 9.4 mg/dL (ref 8.6–10.3)
Chloride: 104 mmol/L (ref 98–110)
Creat: 0.99 mg/dL (ref 0.70–1.28)
Globulin: 2.7 g/dL (ref 1.9–3.7)
Glucose, Bld: 92 mg/dL (ref 65–99)
Potassium: 4.4 mmol/L (ref 3.5–5.3)
Sodium: 139 mmol/L (ref 135–146)
Total Bilirubin: 0.9 mg/dL (ref 0.2–1.2)
Total Protein: 6.6 g/dL (ref 6.1–8.1)

## 2024-04-03 LAB — CBC WITH DIFFERENTIAL/PLATELET
Absolute Lymphocytes: 2226 {cells}/uL (ref 850–3900)
Absolute Monocytes: 546 {cells}/uL (ref 200–950)
Basophils Absolute: 59 {cells}/uL (ref 0–200)
Basophils Relative: 0.7 %
Eosinophils Absolute: 176 {cells}/uL (ref 15–500)
Eosinophils Relative: 2.1 %
HCT: 44.8 % (ref 39.4–51.1)
Hemoglobin: 14.4 g/dL (ref 13.2–17.1)
MCH: 30.4 pg (ref 27.0–33.0)
MCHC: 32.1 g/dL (ref 31.6–35.4)
MCV: 94.5 fL (ref 81.4–101.7)
MPV: 11.9 fL (ref 7.5–12.5)
Monocytes Relative: 6.5 %
Neutro Abs: 5393 {cells}/uL (ref 1500–7800)
Neutrophils Relative %: 64.2 %
Platelets: 289 Thousand/uL (ref 140–400)
RBC: 4.74 Million/uL (ref 4.20–5.80)
RDW: 13.3 % (ref 11.0–15.0)
Total Lymphocyte: 26.5 %
WBC: 8.4 Thousand/uL (ref 3.8–10.8)

## 2024-04-03 LAB — HEMOGLOBIN A1C
Hgb A1c MFr Bld: 5.5 % (ref <5.7)
Mean Plasma Glucose: 111 mg/dL
eAG (mmol/L): 6.2 mmol/L

## 2024-04-03 LAB — PSA: PSA: 0.37 ng/mL (ref <=4.00)

## 2024-04-07 ENCOUNTER — Encounter: Payer: Self-pay | Admitting: Family Medicine

## 2024-04-07 ENCOUNTER — Ambulatory Visit: Admitting: Family Medicine

## 2024-04-07 VITALS — BP 120/76 | HR 70 | Temp 97.7°F | Ht 70.0 in | Wt 301.0 lb

## 2024-04-07 DIAGNOSIS — G4733 Obstructive sleep apnea (adult) (pediatric): Secondary | ICD-10-CM

## 2024-04-07 DIAGNOSIS — R7303 Prediabetes: Secondary | ICD-10-CM

## 2024-04-07 DIAGNOSIS — I10 Essential (primary) hypertension: Secondary | ICD-10-CM | POA: Diagnosis not present

## 2024-04-07 DIAGNOSIS — Z0000 Encounter for general adult medical examination without abnormal findings: Secondary | ICD-10-CM | POA: Diagnosis not present

## 2024-04-07 DIAGNOSIS — E782 Mixed hyperlipidemia: Secondary | ICD-10-CM | POA: Diagnosis not present

## 2024-04-07 NOTE — Progress Notes (Signed)
 Wt Readings from Last 3 Encounters:  04/07/24 (!) 301 lb (136.5 kg)  02/21/24 (!) 308 lb (139.7 kg)  12/06/23 (!) 341 lb 9.6 oz (154.9 kg)     Subjective:    Patient ID: James Branch, male    DOB: 04-24-1955, 68 y.o.   MRN: [id number] Patient is a very pleasant 68 year old Caucasian gentleman who is here today for complete physical exam.  He has lost 40 pounds since August on Zepbound .  I congratulated him on this.  His immunizations are up-to-date.  He has had every shot including RSV.  His colonoscopy was performed in 2020.  They recommended a repeat colonoscopy in 2027.  His recent PSA was undetectable.  His most recent lab work is listed below. Immunization History  Administered Date(s) Administered   Fluad  Quad(high Dose 65+) 01/26/2021, 01/20/2022   Fluad  Trivalent(High Dose 65+) 12/26/2022   INFLUENZA, HIGH DOSE SEASONAL PF 01/28/2024   Influenza,inj,Quad PF,6+ Mos 03/14/2017, 03/04/2018, 01/31/2019, 02/05/2020   Influenza-Unspecified 02/28/2016, 02/24/2017   PFIZER(Purple Top)SARS-COV-2 Vaccination 05/23/2019, 06/17/2019, 01/22/2020, 10/29/2020   PNEUMOCOCCAL CONJUGATE-20 01/26/2021   Pfizer Covid-19 Vaccine Bivalent Booster 12yrs & up 02/10/2021   Pfizer(Comirnaty )Fall Seasonal Vaccine 12 years and older 02/08/2022, 04/15/2022, 12/26/2022, 01/28/2024   Pneumococcal Polysaccharide-23 01/31/2019   Tdap 10/26/2014   Zoster Recombinant(Shingrix ) 03/04/2021, 06/10/2021   Zoster, Live 03/28/2016    Lab on 04/02/2024  Component Date Value Ref Range Status   WBC 04/02/2024 8.4  3.8 - 10.8 Thousand/uL Final   RBC 04/02/2024 4.74  4.20 - 5.80 Million/uL Final   Hemoglobin 04/02/2024 14.4  13.2 - 17.1 g/dL Final   HCT 87/82/7974 44.8  39.4 - 51.1 % Final   MCV 04/02/2024 94.5  81.4 - 101.7 fL Final   MCH 04/02/2024 30.4  27.0 - 33.0 pg Final   MCHC 04/02/2024 32.1  31.6 - 35.4 g/dL Final   RDW 87/82/7974 13.3  11.0 - 15.0 % Final   Platelets 04/02/2024 289  140 - 400 Thousand/uL  Final   MPV 04/02/2024 11.9  7.5 - 12.5 fL Final   Neutro Abs 04/02/2024 5,393  1,500 - 7,800 cells/uL Final   Absolute Lymphocytes 04/02/2024 2,226  850 - 3,900 cells/uL Final   Absolute Monocytes 04/02/2024 546  200 - 950 cells/uL Final   Eosinophils Absolute 04/02/2024 176  15 - 500 cells/uL Final   Basophils Absolute 04/02/2024 59  0 - 200 cells/uL Final   Neutrophils Relative % 04/02/2024 64.2  % Final   Total Lymphocyte 04/02/2024 26.5  % Final   Monocytes Relative 04/02/2024 6.5  % Final   Eosinophils Relative 04/02/2024 2.1  % Final   Basophils Relative 04/02/2024 0.7  % Final   Glucose, Bld 04/02/2024 92  65 - 99 mg/dL Final   Comment: .            Fasting reference interval .    BUN 04/02/2024 10  7 - 25 mg/dL Final   Creat 87/82/7974 0.99  0.70 - 1.28 mg/dL Final   BUN/Creatinine Ratio 04/02/2024 SEE NOTE:  6 - 22 (calc) Final   Comment:    Not Reported: BUN and Creatinine are within    reference range. .    Sodium 04/02/2024 139  135 - 146 mmol/L Final   Potassium 04/02/2024 4.4  3.5 - 5.3 mmol/L Final   Chloride 04/02/2024 104  98 - 110 mmol/L Final   CO2 04/02/2024 25  20 - 32 mmol/L Final   Calcium  04/02/2024 9.4  8.6 - 10.3  mg/dL Final   Total Protein 87/82/7974 6.6  6.1 - 8.1 g/dL Final   Albumin 87/82/7974 3.9  3.6 - 5.1 g/dL Final   Globulin 87/82/7974 2.7  1.9 - 3.7 g/dL (calc) Final   AG Ratio 04/02/2024 1.4  1.0 - 2.5 (calc) Final   Total Bilirubin 04/02/2024 0.9  0.2 - 1.2 mg/dL Final   Alkaline phosphatase (APISO) 04/02/2024 91  35 - 144 U/L Final   AST 04/02/2024 12  10 - 35 U/L Final   ALT 04/02/2024 15  9 - 46 U/L Final   Cholesterol 04/02/2024 121  <200 mg/dL Final   HDL 87/82/7974 47  > OR = 40 mg/dL Final   Triglycerides 87/82/7974 75  <150 mg/dL Final   LDL Cholesterol (Calc) 04/02/2024 58  mg/dL (calc) Final   Comment: Reference range: <100 . Desirable range <100 mg/dL for primary prevention;   <70 mg/dL for patients with CHD or diabetic  patients  with > or = 2 CHD risk factors. SABRA LDL-C is now calculated using the Martin-Hopkins  calculation, which is a validated novel method providing  better accuracy than the Friedewald equation in the  estimation of LDL-C.  Gladis APPLETHWAITE et al. SANDREA. 7986;689(80): 2061-2068  (http://education.[facility].com/faq/FAQ164)    Total CHOL/HDL Ratio 04/02/2024 2.6  <4.9 (calc) Final   Non-HDL Cholesterol (Calc) 04/02/2024 74  <130 mg/dL (calc) Final   Comment: For patients with diabetes plus 1 major ASCVD risk  factor, treating to a non-HDL-C goal of <100 mg/dL  (LDL-C of <29 mg/dL) is considered a therapeutic  option.    PSA 04/02/2024 0.37  < OR = 4.00 ng/mL Final   Comment: The total PSA value from this assay system is  standardized against the WHO standard. The test  result will be approximately 20% lower when compared  to the equimolar-standardized total PSA (Beckman  Coulter). Comparison of serial PSA results should be  interpreted with this fact in mind. . This test was performed using the Siemens  chemiluminescent method. Values obtained from  different assay methods cannot be used interchangeably. PSA levels, regardless of value, should not be interpreted as absolute evidence of the presence or absence of disease.    Hgb A1c MFr Bld 04/02/2024 5.5  <5.7 % Final   Comment: For the purpose of screening for the presence of diabetes: . <5.7%       Consistent with the absence of diabetes 5.7-6.4%    Consistent with increased risk for diabetes             (prediabetes) > or =6.5%  Consistent with diabetes . This assay result is consistent with a decreased risk of diabetes. . Currently, no consensus exists regarding use of hemoglobin A1c for diagnosis of diabetes in children. . According to American Diabetes Association (ADA) guidelines, hemoglobin A1c <7.0% represents optimal control in non-pregnant diabetic patients. Different metrics may apply to specific patient  populations.  Standards of Medical Care in Diabetes(ADA). .    Mean Plasma Glucose 04/02/2024 111  mg/dL Final   eAG (mmol/L) 87/82/7974 6.2  mmol/L Final    Past Medical History:  Diagnosis Date   Arthritis    knees   Asthma    CAD (coronary artery disease)    COPD (chronic obstructive pulmonary disease) (HCC)    Diastolic dysfunction    GERD (gastroesophageal reflux disease)    Hyperlipidemia    Hypertension    Obesity    OSA on CPAP    ahi-84, 18 CWP  Sleep apnea    wears cpap    Smoker    Umbilical hernia    Past Surgical History:  Procedure Laterality Date   CARDIAC CATHETERIZATION     COLONOSCOPY  2007   INSERTION OF MESH N/A 12/16/2020   Procedure: INSERTION OF MESH;  Surgeon: Vernetta Berg, MD;  Location: [facility] OR;  Service: General;  Laterality: N/A;   LEFT HEART CATH AND CORONARY ANGIOGRAPHY N/A 10/12/2020   Procedure: LEFT HEART CATH AND CORONARY ANGIOGRAPHY;  Surgeon: Elmira Newman PARAS, MD;  Location: [facility] INVASIVE CV LAB;  Service: Cardiovascular;  Laterality: N/A;   TONSILLECTOMY  1967   UMBILICAL HERNIA REPAIR N/A 12/16/2020   Procedure: UMBILICAL HERNIA REPAIR WITH MESH;  Surgeon: Vernetta Berg, MD;  Location: [facility] OR;  Service: General;  Laterality: N/A;  LMA     Allergies  Allergen Reactions   Penicillins Swelling   Social History   Socioeconomic History   Marital status: Married    Spouse name: [name]   Number of children: 4   Years of education: Not on file   Highest education level: Associate degree: occupational, scientist, product/process development, or vocational program  Occupational History   Occupation: Retired  Tobacco Use   Smoking status: Former    Current packs/day: 0.00    Average packs/day: 0.5 packs/day for 20.0 years (10.0 ttl pk-yrs)    Types: Cigarettes    Start date: 02/17/1999    Quit date: 02/17/2019    Years since quitting: 5.1   Smokeless tobacco: Never  Vaping Use   Vaping status: Never Used  Substance and Sexual Activity   Alcohol use:  No    Alcohol/week: 0.0 standard drinks of alcohol   Drug use: No   Sexual activity: Yes    Birth control/protection: Condom  Other Topics Concern   Not on file  Social History Narrative   4 children, 18 grandchildren.   Social Drivers of Health   Tobacco Use: Medium Risk (04/07/2024)   Patient History    Smoking Tobacco Use: Former    Smokeless Tobacco Use: Never    Passive Exposure: Not on file  Financial Resource Strain: Low Risk (04/03/2024)   Overall Financial Resource Strain (CARDIA)    Difficulty of Paying Living Expenses: Not hard at all  Food Insecurity: No Food Insecurity (04/03/2024)   Epic    Worried About Programme Researcher, Broadcasting/film/video in the Last Year: Never true    Ran Out of Food in the Last Year: Never true  Transportation Needs: No Transportation Needs (04/03/2024)   Epic    Lack of Transportation (Medical): No    Lack of Transportation (Non-Medical): No  Physical Activity: Insufficiently Active (04/03/2024)   Exercise Vital Sign    Days of Exercise per Week: 3 days    Minutes of Exercise per Session: 30 min  Stress: No Stress Concern Present (04/03/2024)   [employer] of Occupational Health - Occupational Stress Questionnaire    Feeling of Stress: Only a little  Social Connections: Moderately Isolated (04/03/2024)   Social Connection and Isolation Panel    Frequency of Communication with Friends and Family: Twice a week    Frequency of Social Gatherings with Friends and Family: Once a week    Attends Religious Services: Never    Database Administrator or Organizations: No    Attends Banker Meetings: Not on file    Marital Status: Married  Intimate Partner Violence: Not At Risk (02/21/2024)   Epic    Fear of  Current or Ex-Partner: No    Emotionally Abused: No    Physically Abused: No    Sexually Abused: No  Depression (PHQ2-9): Low Risk (04/07/2024)   Depression (PHQ2-9)    PHQ-2 Score: 0  Alcohol Screen: Low Risk (04/26/2022)   Alcohol  Screen    Last Alcohol Screening Score (AUDIT): 0  Housing: Low Risk (04/03/2024)   Epic    Unable to Pay for Housing in the Last Year: No    Number of Times Moved in the Last Year: 0    Homeless in the Last Year: No  Utilities: Not At Risk (02/21/2024)   Epic    Threatened with loss of utilities: No  Health Literacy: Adequate Health Literacy (02/21/2024)   B1300 Health Literacy    Frequency of need for help with medical instructions: Never      Review of Systems  All other systems reviewed and are negative.      Objective:   Physical Exam Vitals reviewed.  Constitutional:      General: He is not in acute distress.    Appearance: Normal appearance. He is obese. He is not ill-appearing or toxic-appearing.  HENT:     Right Ear: Tympanic membrane and ear canal normal.     Left Ear: Tympanic membrane and ear canal normal.     Nose: Nose normal. No congestion or rhinorrhea.     Mouth/Throat:     Mouth: Mucous membranes are moist.     Pharynx: Oropharynx is clear. No oropharyngeal exudate or posterior oropharyngeal erythema.  Eyes:     General:        Right eye: No discharge.        Left eye: No discharge.     Conjunctiva/sclera: Conjunctivae normal.     Pupils: Pupils are equal, round, and reactive to light.  Neck:     Vascular: No carotid bruit.  Cardiovascular:     Rate and Rhythm: Normal rate and regular rhythm.     Heart sounds: Normal heart sounds. No murmur heard.    No friction rub. No gallop.  Pulmonary:     Effort: Pulmonary effort is normal. No accessory muscle usage or respiratory distress.     Breath sounds: Normal breath sounds. No decreased breath sounds, wheezing, rhonchi or rales.  Abdominal:     General: Bowel sounds are normal. There is no distension.     Palpations: Abdomen is soft.     Tenderness: There is no abdominal tenderness. There is no guarding or rebound.     Hernia: No hernia is present.  Musculoskeletal:        General: No swelling or  deformity.     Right lower leg: No edema.     Left lower leg: No edema.  Lymphadenopathy:     Cervical: No cervical adenopathy.  Skin:    General: Skin is warm.     Coloration: Skin is not jaundiced.     Findings: No bruising, erythema, lesion or rash.  Neurological:     General: No focal deficit present.     Mental Status: He is alert and oriented to person, place, and time. Mental status is at baseline.     Cranial Nerves: No cranial nerve deficit.     Motor: No weakness.     Coordination: Coordination normal.     Gait: Gait normal.  Psychiatric:        Mood and Affect: Mood normal.        Behavior: Behavior normal.  Thought Content: Thought content normal.        Judgment: Judgment normal.         General medical exam  Essential hypertension  Morbid obesity (HCC)  Prediabetes  OSA on CPAP  Mixed hyperlipidemia Physical exam today is normal aside from his elevated BMI of 43 however I congratulated the patient on his 40 pounds of weight loss.  We will continue Zepbound .  His prediabetes has now reversed.  His hemoglobin A1c has dropped from 6.2 to normal.  His blood pressure today is outstanding.  His cholesterol is excellent.  The patient is compliant with his CPAP for his obstructive sleep apnea.  I will make no changes in his medication as his blood pressure is 120/76 and his labs are outstanding.  Continue to encourage the patient with diet exercise and weight loss.  His immunizations are up-to-date.  His PSA is normal.  His colonoscopy is not due until 2027.

## 2024-04-17 ENCOUNTER — Encounter: Payer: Self-pay | Admitting: Family Medicine

## 2024-04-18 ENCOUNTER — Other Ambulatory Visit: Payer: Self-pay

## 2024-04-18 MED ORDER — ZEPBOUND 12.5 MG/0.5ML ~~LOC~~ SOLN: 12.5000 mg | SUBCUTANEOUS | 3 refills | Status: DC

## 2024-04-24 ENCOUNTER — Other Ambulatory Visit: Payer: Self-pay

## 2024-04-24 MED ORDER — ZEPBOUND 12.5 MG/0.5ML ~~LOC~~ SOLN: 12.5000 mg | SUBCUTANEOUS | 3 refills | Status: DC

## 2024-04-28 ENCOUNTER — Ambulatory Visit (HOSPITAL_COMMUNITY)
Admission: RE | Admit: 2024-04-28 | Discharge: 2024-04-28 | Disposition: A | Discharge status: Home / Self Care | Source: Ambulatory Visit | Attending: *Deleted | Admitting: *Deleted

## 2024-04-28 DIAGNOSIS — I700 Atherosclerosis of aorta: Secondary | ICD-10-CM | POA: Diagnosis not present

## 2024-04-28 DIAGNOSIS — Z122 Encounter for screening for malignant neoplasm of respiratory organs: Secondary | ICD-10-CM | POA: Insufficient documentation

## 2024-04-28 DIAGNOSIS — J439 Emphysema, unspecified: Secondary | ICD-10-CM | POA: Insufficient documentation

## 2024-04-28 DIAGNOSIS — Z87891 Personal history of nicotine dependence: Secondary | ICD-10-CM | POA: Insufficient documentation

## 2024-04-28 DIAGNOSIS — K8020 Calculus of gallbladder without cholecystitis without obstruction: Secondary | ICD-10-CM | POA: Insufficient documentation

## 2024-05-08 ENCOUNTER — Other Ambulatory Visit: Payer: Self-pay

## 2024-05-08 ENCOUNTER — Encounter: Payer: Self-pay | Admitting: Family Medicine

## 2024-05-08 DIAGNOSIS — G4733 Obstructive sleep apnea (adult) (pediatric): Secondary | ICD-10-CM

## 2024-05-08 DIAGNOSIS — E782 Mixed hyperlipidemia: Secondary | ICD-10-CM

## 2024-05-08 MED ORDER — ZEPBOUND 12.5 MG/0.5ML ~~LOC~~ SOLN: 12.5000 mg | SUBCUTANEOUS | 3 refills | Status: AC

## 2024-05-19 ENCOUNTER — Other Ambulatory Visit: Payer: Self-pay | Admitting: Family Medicine

## 2024-05-19 DIAGNOSIS — E782 Mixed hyperlipidemia: Secondary | ICD-10-CM
